# Patient Record
Sex: Female | Born: 1945
Health system: Southern US, Community
[De-identification: ages and names within clinical notes are randomized; demographics above are authoritative.]

## PROBLEM LIST (undated history)

## (undated) DIAGNOSIS — J45909 Unspecified asthma, uncomplicated: Secondary | ICD-10-CM

## (undated) DIAGNOSIS — I10 Essential (primary) hypertension: Secondary | ICD-10-CM

## (undated) DIAGNOSIS — E785 Hyperlipidemia, unspecified: Secondary | ICD-10-CM

## (undated) DIAGNOSIS — K219 Gastro-esophageal reflux disease without esophagitis: Secondary | ICD-10-CM

## (undated) DIAGNOSIS — M8580 Other specified disorders of bone density and structure, unspecified site: Secondary | ICD-10-CM

## (undated) DIAGNOSIS — M1990 Unspecified osteoarthritis, unspecified site: Secondary | ICD-10-CM

## (undated) DIAGNOSIS — Z8679 Personal history of other diseases of the circulatory system: Secondary | ICD-10-CM

## (undated) HISTORY — DX: Unspecified asthma, uncomplicated: J45.909

## (undated) HISTORY — DX: Unspecified osteoarthritis, unspecified site: M19.90

## (undated) HISTORY — DX: Other specified disorders of bone density and structure, unspecified site: M85.80

## (undated) HISTORY — DX: Essential (primary) hypertension: I10

## (undated) HISTORY — DX: Hyperlipidemia, unspecified: E78.5

## (undated) HISTORY — PX: BACK SURGERY: SHX140

## (undated) HISTORY — PX: BREAST EXCISIONAL BIOPSY: SUR124

## (undated) HISTORY — DX: Gastro-esophageal reflux disease without esophagitis: K21.9

## (undated) HISTORY — PX: CERVICAL POLYPECTOMY: SHX88

## (undated) HISTORY — PX: BREAST LUMPECTOMY: SHX2

## (undated) HISTORY — DX: Personal history of other diseases of the circulatory system: Z86.79

---

## 1997-08-23 ENCOUNTER — Ambulatory Visit (HOSPITAL_COMMUNITY): Admission: RE | Admit: 1997-08-23 | Discharge: 1997-08-23 | Payer: Self-pay | Admitting: *Deleted

## 1998-01-25 ENCOUNTER — Other Ambulatory Visit: Admission: RE | Admit: 1998-01-25 | Discharge: 1998-01-25 | Payer: Self-pay | Admitting: Internal Medicine

## 1998-05-22 ENCOUNTER — Other Ambulatory Visit: Admission: RE | Admit: 1998-05-22 | Discharge: 1998-05-22 | Payer: Self-pay | Admitting: *Deleted

## 1998-05-31 ENCOUNTER — Other Ambulatory Visit: Admission: RE | Admit: 1998-05-31 | Discharge: 1998-05-31 | Payer: Self-pay | Admitting: *Deleted

## 1998-09-14 ENCOUNTER — Ambulatory Visit (HOSPITAL_COMMUNITY): Admission: RE | Admit: 1998-09-14 | Discharge: 1998-09-14 | Payer: Self-pay | Admitting: *Deleted

## 1998-09-18 ENCOUNTER — Ambulatory Visit (HOSPITAL_COMMUNITY): Admission: RE | Admit: 1998-09-18 | Discharge: 1998-09-18 | Payer: Self-pay | Admitting: *Deleted

## 1999-06-14 ENCOUNTER — Other Ambulatory Visit: Admission: RE | Admit: 1999-06-14 | Discharge: 1999-06-14 | Payer: Self-pay | Admitting: Obstetrics & Gynecology

## 1999-09-25 ENCOUNTER — Encounter: Payer: Self-pay | Admitting: Obstetrics & Gynecology

## 1999-09-25 ENCOUNTER — Ambulatory Visit (HOSPITAL_COMMUNITY): Admission: RE | Admit: 1999-09-25 | Discharge: 1999-09-25 | Payer: Self-pay | Admitting: Obstetrics & Gynecology

## 2000-06-18 ENCOUNTER — Other Ambulatory Visit: Admission: RE | Admit: 2000-06-18 | Discharge: 2000-06-18 | Payer: Self-pay | Admitting: Obstetrics & Gynecology

## 2000-09-18 ENCOUNTER — Encounter: Payer: Self-pay | Admitting: Obstetrics & Gynecology

## 2000-09-18 ENCOUNTER — Encounter: Admission: RE | Admit: 2000-09-18 | Discharge: 2000-09-18 | Payer: Self-pay | Admitting: Obstetrics & Gynecology

## 2000-12-03 ENCOUNTER — Encounter: Payer: Self-pay | Admitting: Family Medicine

## 2000-12-03 ENCOUNTER — Encounter: Admission: RE | Admit: 2000-12-03 | Discharge: 2000-12-03 | Payer: Self-pay | Admitting: Family Medicine

## 2001-07-02 ENCOUNTER — Other Ambulatory Visit: Admission: RE | Admit: 2001-07-02 | Discharge: 2001-07-02 | Payer: Self-pay | Admitting: Obstetrics & Gynecology

## 2001-09-22 ENCOUNTER — Encounter: Payer: Self-pay | Admitting: Obstetrics & Gynecology

## 2001-09-22 ENCOUNTER — Ambulatory Visit (HOSPITAL_COMMUNITY): Admission: RE | Admit: 2001-09-22 | Discharge: 2001-09-22 | Payer: Self-pay | Admitting: Obstetrics & Gynecology

## 2002-07-02 ENCOUNTER — Other Ambulatory Visit: Admission: RE | Admit: 2002-07-02 | Discharge: 2002-07-02 | Payer: Self-pay | Admitting: Obstetrics & Gynecology

## 2002-07-12 ENCOUNTER — Encounter: Payer: Self-pay | Admitting: Family Medicine

## 2002-07-12 ENCOUNTER — Encounter: Admission: RE | Admit: 2002-07-12 | Discharge: 2002-07-12 | Payer: Self-pay | Admitting: Family Medicine

## 2002-09-24 ENCOUNTER — Encounter: Payer: Self-pay | Admitting: Obstetrics & Gynecology

## 2002-09-24 ENCOUNTER — Ambulatory Visit (HOSPITAL_COMMUNITY): Admission: RE | Admit: 2002-09-24 | Discharge: 2002-09-24 | Payer: Self-pay | Admitting: Obstetrics & Gynecology

## 2002-11-23 ENCOUNTER — Encounter: Admission: RE | Admit: 2002-11-23 | Discharge: 2002-12-07 | Payer: Self-pay | Admitting: Internal Medicine

## 2002-12-07 ENCOUNTER — Encounter: Payer: Self-pay | Admitting: Neurosurgery

## 2002-12-08 ENCOUNTER — Encounter: Payer: Self-pay | Admitting: Neurosurgery

## 2002-12-08 ENCOUNTER — Inpatient Hospital Stay (HOSPITAL_COMMUNITY): Admission: RE | Admit: 2002-12-08 | Discharge: 2002-12-10 | Payer: Self-pay | Admitting: Neurosurgery

## 2002-12-09 ENCOUNTER — Encounter: Payer: Self-pay | Admitting: Neurosurgery

## 2003-07-07 ENCOUNTER — Other Ambulatory Visit: Admission: RE | Admit: 2003-07-07 | Discharge: 2003-07-07 | Payer: Self-pay | Admitting: Obstetrics & Gynecology

## 2003-09-29 ENCOUNTER — Ambulatory Visit (HOSPITAL_COMMUNITY): Admission: RE | Admit: 2003-09-29 | Discharge: 2003-09-29 | Payer: Self-pay | Admitting: Obstetrics & Gynecology

## 2004-04-16 ENCOUNTER — Encounter: Admission: RE | Admit: 2004-04-16 | Discharge: 2004-04-16 | Payer: Self-pay | Admitting: Internal Medicine

## 2005-07-24 ENCOUNTER — Ambulatory Visit (HOSPITAL_COMMUNITY): Admission: RE | Admit: 2005-07-24 | Discharge: 2005-07-24 | Payer: Self-pay | Admitting: Obstetrics & Gynecology

## 2006-04-08 ENCOUNTER — Encounter: Admission: RE | Admit: 2006-04-08 | Discharge: 2006-05-07 | Payer: Self-pay | Admitting: Internal Medicine

## 2006-10-16 ENCOUNTER — Ambulatory Visit (HOSPITAL_COMMUNITY): Admission: RE | Admit: 2006-10-16 | Discharge: 2006-10-16 | Payer: Self-pay | Admitting: Obstetrics & Gynecology

## 2006-10-22 ENCOUNTER — Encounter: Admission: RE | Admit: 2006-10-22 | Discharge: 2006-10-22 | Payer: Self-pay | Admitting: Obstetrics & Gynecology

## 2007-05-13 ENCOUNTER — Encounter: Admission: RE | Admit: 2007-05-13 | Discharge: 2007-05-13 | Payer: Self-pay | Admitting: Obstetrics & Gynecology

## 2007-06-02 ENCOUNTER — Encounter: Admission: RE | Admit: 2007-06-02 | Discharge: 2007-06-02 | Payer: Self-pay | Admitting: Obstetrics & Gynecology

## 2007-10-19 ENCOUNTER — Encounter: Admission: RE | Admit: 2007-10-19 | Discharge: 2007-10-19 | Payer: Self-pay | Admitting: Obstetrics & Gynecology

## 2008-10-19 ENCOUNTER — Encounter: Admission: RE | Admit: 2008-10-19 | Discharge: 2008-10-19 | Payer: Self-pay | Admitting: Obstetrics & Gynecology

## 2009-10-23 ENCOUNTER — Emergency Department (HOSPITAL_BASED_OUTPATIENT_CLINIC_OR_DEPARTMENT_OTHER): Admission: EM | Admit: 2009-10-23 | Discharge: 2009-10-23 | Payer: Self-pay | Admitting: Emergency Medicine

## 2009-10-23 ENCOUNTER — Ambulatory Visit (HOSPITAL_COMMUNITY): Admission: RE | Admit: 2009-10-23 | Discharge: 2009-10-23 | Payer: Self-pay | Admitting: Obstetrics & Gynecology

## 2010-07-14 ENCOUNTER — Emergency Department (HOSPITAL_BASED_OUTPATIENT_CLINIC_OR_DEPARTMENT_OTHER)
Admission: EM | Admit: 2010-07-14 | Discharge: 2010-07-15 | Payer: Self-pay | Source: Home / Self Care | Admitting: Emergency Medicine

## 2010-08-04 ENCOUNTER — Encounter: Payer: Self-pay | Admitting: Internal Medicine

## 2010-08-05 ENCOUNTER — Encounter: Payer: Self-pay | Admitting: Neurosurgery

## 2010-09-19 ENCOUNTER — Other Ambulatory Visit (HOSPITAL_COMMUNITY): Payer: Self-pay | Admitting: Obstetrics & Gynecology

## 2010-09-19 DIAGNOSIS — Z1231 Encounter for screening mammogram for malignant neoplasm of breast: Secondary | ICD-10-CM

## 2010-10-25 ENCOUNTER — Ambulatory Visit (HOSPITAL_COMMUNITY)
Admission: RE | Admit: 2010-10-25 | Discharge: 2010-10-25 | Disposition: A | Discharge status: Home / Self Care | Payer: 59 | Source: Ambulatory Visit | Attending: Obstetrics & Gynecology | Admitting: Obstetrics & Gynecology

## 2010-10-25 DIAGNOSIS — Z1231 Encounter for screening mammogram for malignant neoplasm of breast: Secondary | ICD-10-CM | POA: Insufficient documentation

## 2010-11-30 NOTE — H&P (Signed)
NAME:  Denise Gonzales, Denise Gonzales NO.:  [id number]   MEDICAL RECORD NO.:  [id number]                   PATIENT TYPE:  INP   LOCATION:  2899                                 FACILITY:  [facility]   PHYSICIAN:  Hilda Lias, M.D.                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  Lately, she had  been complaining of back pain with radiation to the left leg.  She had  weakness which is getting worse despite conservative treatment which  includes an epidural injection.  Because of the persistent pain and  especially because of the weakness, MRI was obtained and she wants to  proceed with surgery.   PAST MEDICAL HISTORY:  1. Lumbar diskectomy.  2. Breast surgery.   ALLERGIES:  She is not allergic to any medication.   SOCIAL HISTORY:  Negative.   FAMILY HISTORY:  Father died at the age of 77 with high blood pressure and  kidney failure.  Mother died of a heart attack.   REVIEW OF SYSTEMS:  She has a history of high blood pressure, high  cholesterol, asthma, and back pain.   PHYSICAL EXAMINATION:  HEENT:  Normal.  NECK:  Normal.  LUNGS:  Clear.  CARDIAC:  Heart sounds normal.  No murmurs.  EXTREMITIES:  Normal pulses.  NEUROLOGICAL:  Mental status and cranial nerves normal.  Strength 5/5.  She  states she has a 3/5 weakness of dorsiflexion in the left foot.  Sensation:  She complains of numbness mostly in the top of the left foot.  Reflexes  symmetrical.   LABORATORY DATA:  The MRI showed she has a recurrent disk at L4-5.  At L3-4,  there is another herniated disk compromising the L4 nerve root.   CLINICAL IMPRESSION:  1. Recurrent L4-5 herniated disk.  2. L3-4 herniated disk.    RECOMMENDATIONS:  The patient is being admitted  for surgery.  We are going  to proceed with a L4-5 diskectomy and we are going to take a look at the  level of L3-4.  The decision of what to do at this level will be made during  surgery.  The patient knows of the risks such as infection, CSF leak,  worsening pain, paralysis, need for further surgery which might require  fusion.                                               Hilda Lias, M.D.    EB/MEDQ  D:  12/08/2002  T:  12/08/2002  Job:  [id number]

## 2010-11-30 NOTE — Op Note (Signed)
NAME:  Denise Gonzales, Denise Gonzales                             ACCOUNT NO.:  [id number]   MEDICAL RECORD NO.:  [id number]                   PATIENT TYPE:  INP   LOCATION:  3172                                 FACILITY:  [facility]   PHYSICIAN:  Hilda Lias, M.D.                DATE OF BIRTH:  1945/10/29   DATE OF PROCEDURE:  12/08/2002  DATE OF DISCHARGE:                                 OPERATIVE REPORT   PREOPERATIVE DIAGNOSIS:  Left L4-5 recurrent herniated disk, left L3-4  herniated disk.   POSTOPERATIVE DIAGNOSIS:  Left L4-5 recurrent herniated disk, left L3-4  herniated disk.   PROCEDURE:  Left L4 hemilaminectomy, left 4-5 total diskectomy, lysis of  adhesions, foraminotomy, and left 3-4 foraminotomy.  Microscope.   SURGEON:  Hilda Lias, M.D.   ASSISTANT:  Coletta Memos, M.D.   INDICATIONS FOR PROCEDURE:  The patient is a 65 year old female who had  surgery two years ago by a different Careers adviser.  Now, she has been complaining  of worsening of the pain with weakness of the left foot.  MRI showed  recurrent disk at the level of 4-5 with a calcified disk at the level of 3-  4. Surgery was advised.  The risks were explained in the history and  physical.   DESCRIPTION OF PROCEDURE:  The patient was taken to the OR and after  intubation, she was positioned in a prone manner. The back was prepped with  Betadine.  Midline incision resecting the previous scar was made.  By x-ray  we identified the area between 3-4 and 4-5.  The patient had quite a bit of  adhesions.  We started doing lysis of adhesions and with the drill we  started drilling the local lamina of L4.  Nevertheless, we found no yellow  ligament and we ended up having to do a L4 hemilaminectomy.  From then on we  worked our way down doing the lysis of adhesions.  Finally, we were able to  retract the thecal sac and enter into the disk space. The patient had quite  a bit of degenerative disk and total gross diskectomy was done.  There were  two large pieces of disk compromising the takeoff of L5 and attached to the  thecal sac. Those pieces were removed.  At the end, we had good  decompression of the L5 nerve root.  Then we investigated the area between 3-  4 where we found a calcified disk.  There was no need to remove the disk,  but we did foraminotomy to decompress the L3 and L4 nerve roots.  Having  done this, we used Pissell into the area to prevent any leak. Valsalva  maneuver was negative prior to that.  From then on, the area was irrigated  and the wound was closed with Vicryl and Steri-Strips.  Dressing was  applied.  Hilda Lias, M.D.    EB/MEDQ  D:  12/08/2002  T:  12/08/2002  Job:  [id number]

## 2011-10-16 ENCOUNTER — Other Ambulatory Visit (HOSPITAL_COMMUNITY): Payer: Self-pay | Admitting: Obstetrics & Gynecology

## 2011-10-16 DIAGNOSIS — Z1231 Encounter for screening mammogram for malignant neoplasm of breast: Secondary | ICD-10-CM

## 2011-11-11 ENCOUNTER — Ambulatory Visit (HOSPITAL_COMMUNITY)
Admission: RE | Admit: 2011-11-11 | Discharge: 2011-11-11 | Disposition: A | Discharge status: Home / Self Care | Payer: Medicare Other | Source: Ambulatory Visit | Attending: Obstetrics & Gynecology | Admitting: Obstetrics & Gynecology

## 2011-11-11 DIAGNOSIS — Z1231 Encounter for screening mammogram for malignant neoplasm of breast: Secondary | ICD-10-CM | POA: Insufficient documentation

## 2012-10-06 ENCOUNTER — Encounter: Payer: Self-pay | Admitting: Internal Medicine

## 2012-10-08 ENCOUNTER — Other Ambulatory Visit (HOSPITAL_COMMUNITY): Payer: Self-pay | Admitting: Obstetrics & Gynecology

## 2012-10-08 DIAGNOSIS — Z1231 Encounter for screening mammogram for malignant neoplasm of breast: Secondary | ICD-10-CM

## 2012-11-11 ENCOUNTER — Ambulatory Visit (HOSPITAL_COMMUNITY)
Admission: RE | Admit: 2012-11-11 | Discharge: 2012-11-11 | Disposition: A | Discharge status: Home / Self Care | Payer: Medicare Other | Source: Ambulatory Visit | Attending: Obstetrics & Gynecology | Admitting: Obstetrics & Gynecology

## 2012-11-11 DIAGNOSIS — Z1231 Encounter for screening mammogram for malignant neoplasm of breast: Secondary | ICD-10-CM

## 2013-09-28 ENCOUNTER — Encounter: Payer: Self-pay | Admitting: Internal Medicine

## 2013-09-28 ENCOUNTER — Encounter (INDEPENDENT_AMBULATORY_CARE_PROVIDER_SITE_OTHER): Payer: Self-pay

## 2013-09-28 ENCOUNTER — Ambulatory Visit (INDEPENDENT_AMBULATORY_CARE_PROVIDER_SITE_OTHER): Payer: Medicare Other | Admitting: Internal Medicine

## 2013-09-28 VITALS — BP 140/80 | HR 70 | Temp 97.3°F | Ht 64.0 in | Wt 124.4 lb

## 2013-09-28 DIAGNOSIS — J45909 Unspecified asthma, uncomplicated: Secondary | ICD-10-CM

## 2013-09-28 DIAGNOSIS — I10 Essential (primary) hypertension: Secondary | ICD-10-CM | POA: Insufficient documentation

## 2013-09-28 MED ORDER — FLUTICASONE-SALMETEROL 100-50 MCG/DOSE IN AEPB: INHALATION_SPRAY | RESPIRATORY_TRACT | Status: DC

## 2013-09-28 NOTE — Patient Instructions (Addendum)
Work on inhaler technique:  relax and gently blow all the way out then take a nice smooth deep breath back in, triggering the inhaler at same time you start breathing in.  Hold for up to 5 seconds if you can.  Rinse and gargle with water when done  If you want our help, we would be delighted to help but before you return here to this office we need to do the following  1) substitute by your formulary a better choice than lisinopril for a respiratory patient (diovan 160/25 one daily would be a good choice)  2) return with your formulary in hand  for a better plan A to chose from if you continue to need the proair more twice a week   3) you will need to be able to master the inhaler technique or we'll need to look at other options  4) call us with the name of your primary care doctor   5) Add back advair 100 just one puff each am (but increase to twice daily if still needing proaire more than twice a week)  6) consider our [place] office for follow up if desired

## 2013-09-28 NOTE — Assessment & Plan Note (Signed)
DDX of  difficult airways managment all start with A and  include Adherence, Ace Inhibitors, Acid Reflux, Active Sinus Disease, Alpha 1 Antitripsin deficiency, Anxiety masquerading as Airways dz,  ABPA,  allergy(esp in young), Aspiration (esp in elderly), Adverse effects of DPI,  Active smokers, plus two Bs  = Bronchiectasis and Beta blocker use..and one C= CHF  Adherence is always the initial "prime suspect" and is a multilayered concern that requires a "trust but verify" approach in every patient - starting with knowing how to use medications, especially inhalers, correctly, keeping up with refills and understanding the fundamental difference between maintenance and prns vs those medications only taken for a very short course and then stopped and not refilled.  - The proper method of use, as well as anticipated side effects, of a metered-dose inhaler are discussed and demonstrated to the patient. Improved effectiveness after extensive coaching during this visit to a level of approximately  25% from a baseline of < 5% so this calls into question whether what she thinks she treating is even asthma at all  ?ACEi > see hbp a/p  ? Allergies > doubt singulair doing much but at least not causing any side effects so would leave this on for now  Rec Restart advair 100 but just one daily with goal of no need for hfa saba Work on saba technique  Once off acei x 4 weeks try again to stop advair and see what effect if any this has on perceived need for saba

## 2013-09-28 NOTE — Assessment & Plan Note (Signed)
ACE inhibitors are problematic in  pts with airway complaints because  even experienced pulmonologists can't always distinguish ace effects from copd/asthma.  By themselves they don't actually cause a problem, much like oxygen can't by itself start a fire, but they certainly serve as a powerful catalyst or enhancer for any "fire"  or inflammatory process in the upper airway, be it caused by an ET  tube or more commonly reflux (especially in the obese or pts with known GERD or who are on biphoshonates).    In the era of ARB near equivalency until we have a better handle on the reversibility of the airway problem, it just makes sense to avoid ACEI  entirely in the short run until establish how many if any of her ongoing symptoms are really asthma related  See instructions for specific recommendations which were reviewed directly with the patient who was given a copy with highlighter outlining the key components.

## 2013-09-28 NOTE — Progress Notes (Signed)
   Subjective:    Patient ID: Denise Gonzales, female    DOB: Aug 24, 1945    MRN: [id number]  HPI  64 yowf never smoker with asthma since the 1980's and previously followed by Dr Linna Darner on advair/singulair and did fine and rare need for rescue until  due to insurance stopped advair around March 1st 2015 and since then needing proair at least twice daily referred by [facility] 09/28/2013 to pulmonary clinic   09/28/2013 1st [facility] office visit/ Saiquan Hands on ACEi with dtc asthma Chief Complaint  Patient presents with  . Pulmonary Consult    Self referral for asthma. Pt states she was previously seen by Dr Linna Darner. She states her breathing is stable at this time. She is using her rescue inhaler approx 2 x per day.   hfa extremely poor, still maintained on singulair Breathing and "congestion" mainly bothersome walking outside  No obvious other patterns in day to day or daytime variabilty or assoc chronic cough or cp or chest tightness, subjective wheeze overt sinus or hb symptoms. No unusual exp hx or h/o childhood pna/ asthma or knowledge of premature birth.  Sleeping ok without nocturnal  or early am exacerbation  of respiratory  c/o's or need for noct saba. Also denies any obvious fluctuation of symptoms with weather or environmental changes or other aggravating or alleviating factors except as outlined above   Current Medications, Allergies, Complete Past Medical History, Past Surgical History, Family History, and Social History were reviewed in [employer] record.             Review of Systems  Constitutional: Negative for fever, chills and unexpected weight change.  HENT: Negative for congestion, dental problem, ear pain, nosebleeds, postnasal drip, rhinorrhea, sinus pressure, sneezing, sore throat, trouble swallowing and voice change.   Eyes: Negative for visual disturbance.  Respiratory: Negative for cough, choking and shortness of breath.    Cardiovascular: Negative for chest pain and leg swelling.  Gastrointestinal: Negative for vomiting, abdominal pain and diarrhea.  Genitourinary: Negative for difficulty urinating.  Musculoskeletal: Negative for arthralgias.  Skin: Negative for rash.  Neurological: Negative for tremors, syncope and headaches.  Hematological: Does not bruise/bleed easily.       Objective:   Physical Exam  amb wf nad  Wt Readings from Last 3 Encounters:  09/28/13 124 lb 6.4 oz (56.427 kg)     HEENT: nl dentition, turbinates, and orophanx. Nl external ear canals without cough reflex   NECK :  without JVD/Nodes/TM/ nl carotid upstrokes bilaterally   LUNGS: no acc muscle use, clear to A and P bilaterally without cough on insp or exp maneuvers   CV:  RRR  no s3 or murmur or increase in P2, no edema   ABD:  soft and nontender with nl excursion in the supine position. No bruits or organomegaly, bowel sounds nl  MS:  warm without deformities, calf tenderness, cyanosis or clubbing  SKIN: warm and dry without lesions    NEURO:  alert, approp, no deficits        Assessment & Plan:

## 2013-10-13 ENCOUNTER — Other Ambulatory Visit (HOSPITAL_COMMUNITY): Payer: Self-pay | Admitting: Obstetrics & Gynecology

## 2013-10-13 DIAGNOSIS — Z1231 Encounter for screening mammogram for malignant neoplasm of breast: Secondary | ICD-10-CM

## 2013-10-25 ENCOUNTER — Encounter: Payer: Self-pay | Admitting: Internal Medicine

## 2013-11-12 ENCOUNTER — Encounter (INDEPENDENT_AMBULATORY_CARE_PROVIDER_SITE_OTHER): Payer: Self-pay

## 2013-11-12 ENCOUNTER — Ambulatory Visit (HOSPITAL_COMMUNITY)
Admission: RE | Admit: 2013-11-12 | Discharge: 2013-11-12 | Disposition: A | Discharge status: Home / Self Care | Payer: Medicare Other | Source: Ambulatory Visit | Attending: Obstetrics & Gynecology | Admitting: Obstetrics & Gynecology

## 2013-11-12 DIAGNOSIS — Z1231 Encounter for screening mammogram for malignant neoplasm of breast: Secondary | ICD-10-CM | POA: Insufficient documentation

## 2013-12-23 ENCOUNTER — Encounter: Payer: Self-pay | Admitting: Pulmonary Disease

## 2013-12-23 ENCOUNTER — Ambulatory Visit (INDEPENDENT_AMBULATORY_CARE_PROVIDER_SITE_OTHER): Payer: Medicare Other | Admitting: Pulmonary Disease

## 2013-12-23 VITALS — BP 134/82 | HR 85 | Ht 63.0 in | Wt 120.0 lb

## 2013-12-23 DIAGNOSIS — J45909 Unspecified asthma, uncomplicated: Secondary | ICD-10-CM

## 2013-12-23 MED ORDER — FLUTICASONE FUROATE-VILANTEROL 100-25 MCG/INH IN AEPB: 1.0000 | INHALATION_SPRAY | Freq: Every day | RESPIRATORY_TRACT | Status: DC

## 2013-12-23 NOTE — Progress Notes (Signed)
   Subjective:    Patient ID: Denise Gonzales, female    DOB: 07-Aug-1945, 68 y.o.   MRN: [id number]  HPI  7 yowf never smoker with asthma since the 1980's and previously followed by Dr Linna Darner on advair/singulair and did fine and rare need for rescue until due to insurance stopped advair around March 1st 2015 and since then needing proair at least twice daily referred by [facility] 09/28/2013 to pulmonary clinic -seen by MW  hfa extremely poor, still maintained on singulair  Breathing and "congestion" mainly bothersome walking outside  12/23/2013  Chief Complaint  Patient presents with  . Advice Only    Pt transferring from Dr. Melvyn Novas to Dr. Elsworth Soho for chronic asthma. Was dx'ed by Dr Linna Darner in late 1970's, is looking for continuation of care since his retirement.   2 dogs Ran out of advair samples a week ago Insurance would not cover advair or symbicort Spirometry showed moderate airway obstruction   Past Medical History  Diagnosis Date  . Asthma   . Hypertension   . Hyperlipidemia       Review of Systems  Constitutional: Negative for fever and unexpected weight change.  HENT: Positive for sinus pressure. Negative for congestion, dental problem, ear pain, nosebleeds, postnasal drip, rhinorrhea, sneezing, sore throat and trouble swallowing.   Eyes: Negative for redness and itching.  Respiratory: Positive for chest tightness, shortness of breath and wheezing. Negative for cough.   Cardiovascular: Negative for palpitations and leg swelling.  Gastrointestinal: Negative for nausea and vomiting.  Genitourinary: Negative for dysuria.  Musculoskeletal: Negative for joint swelling.  Skin: Negative for rash.  Neurological: Negative for headaches.  Hematological: Does not bruise/bleed easily.  Psychiatric/Behavioral: Negative for dysphoric mood. The patient is not nervous/anxious.        Objective:   Physical Exam  Gen. Pleasant, well-nourished, in no distress ENT - no lesions, no  post nasal drip Neck: No JVD, no thyromegaly, no carotid bruits Lungs: no use of accessory muscles, no dullness to percussion, clear without rales or rhonchi  Cardiovascular: Rhythm regular, heart sounds  normal, no murmurs or gallops, no peripheral edema Musculoskeletal: No deformities, no cyanosis or clubbing        Assessment & Plan:

## 2013-12-23 NOTE — Patient Instructions (Signed)
ALternatives to ADVAIR are Babs [place] or [place] with your insurance & call us back so we can send in RX Breathing test shows moderate airway obstruction- 60%

## 2013-12-24 ENCOUNTER — Telehealth: Payer: Self-pay | Admitting: Pulmonary Disease

## 2013-12-24 MED ORDER — FLUTICASONE-SALMETEROL 100-50 MCG/DOSE IN AEPB: INHALATION_SPRAY | RESPIRATORY_TRACT | Status: DC

## 2013-12-24 NOTE — Assessment & Plan Note (Signed)
Her asthma seems to be a persistent nature. She does require an inhaled steroid- she will find out from her insurance which off the steroid laba combinations is within the formulary I gave her a card for breo in the meantime She will call if symptoms worsen Stay on Singulair We spent sometime discussing the difference between maintenance & rescue medication

## 2013-12-24 NOTE — Telephone Encounter (Signed)
Pt is aware RX has been sent. Nothing further needed 

## 2014-02-17 ENCOUNTER — Encounter: Payer: Self-pay | Admitting: Pulmonary Disease

## 2014-02-17 ENCOUNTER — Ambulatory Visit (INDEPENDENT_AMBULATORY_CARE_PROVIDER_SITE_OTHER): Payer: Medicare Other | Admitting: Pulmonary Disease

## 2014-02-17 VITALS — BP 142/68 | HR 66 | Temp 98.1°F | Ht 63.0 in | Wt 121.1 lb

## 2014-02-17 DIAGNOSIS — J4540 Moderate persistent asthma, uncomplicated: Secondary | ICD-10-CM

## 2014-02-17 DIAGNOSIS — J45909 Unspecified asthma, uncomplicated: Secondary | ICD-10-CM

## 2014-02-17 MED ORDER — MOMETASONE FURO-FORMOTEROL FUM 200-5 MCG/ACT IN AERO: 2.0000 | INHALATION_SPRAY | Freq: Two times a day (BID) | RESPIRATORY_TRACT | Status: DC

## 2014-02-17 NOTE — Patient Instructions (Signed)
Rx for Dulera 200 twice daily x 3 months x  3 refills - sample today Call us with name of nasal spray for Rx Ask cardiologist if lisinopril can be changed to benicar -HCTZ or losartan HCTZ to decrease cough & upper airway irritation

## 2014-02-17 NOTE — Progress Notes (Signed)
   Subjective:    Patient ID: MISTY FOUTZ, female    DOB: [birth year], 68 y.o.   MRN: [id number]  HPI  68 yowf never smoker with asthma since the 1980's and previously followed by Dr Linna Darner on advair/singulair and did fine and rare need for rescue until due to insurance stopped advair around March 1st 2015 and since then needing proair at least twice daily referred by [facility] 09/28/2013 to pulmonary clinic -seen by MW  She transferred to my care in 12/2013  Spirometry showed moderate airway obstruction   02/17/2014  Chief Complaint  Patient presents with  . Follow-up    Pt reports she has good and bad days with breathing. She is now taking advair BID. C/o PND, left ear feels congested x this week. Denies any wheezing, no chest tx, no cough  Insurance would not cover advair or symbicort  Taking advair twice daily for last 2 ds Breathing at baseline She has ben on lisinopril x 18 yrs & resistant to change 2 dogs  Spirometry again showed moderate airway obstruction with FEV1 of 1.39-64%  Review of Systems neg for any significant sore throat, dysphagia, itching, sneezing, nasal congestion or excess/ purulent secretions, fever, chills, sweats, unintended wt loss, pleuritic or exertional cp, hempoptysis, orthopnea pnd or change in chronic leg swelling. Also denies presyncope, palpitations, heartburn, abdominal pain, nausea, vomiting, diarrhea or change in bowel or urinary habits, dysuria,hematuria, rash, arthralgias, visual complaints, headache, numbness weakness or ataxia.     Objective:   Physical Exam  Gen. Pleasant, well-nourished, in no distress ENT - no lesions, no post nasal drip Neck: No JVD, no thyromegaly, no carotid bruits Lungs: no use of accessory muscles, no dullness to percussion, clear without rales or rhonchi  Cardiovascular: Rhythm regular, heart sounds  normal, no murmurs or gallops, no peripheral edema Musculoskeletal: No deformities, no cyanosis or clubbing         Assessment & Plan:

## 2014-02-18 NOTE — Assessment & Plan Note (Signed)
She continues to have moderate airway obstruction on spirometry, this x-ray wonder if there is a degree of fixed airway obstruction that cannot be reversed.  Regardless she does seem to need a steroid/ LABA combination for maintenance Rx for Dulera 200 twice daily x 3 months x  3 refills - sample today Call us with name of nasal spray for Rx Ask cardiologist if lisinopril can be changed to benicar -HCTZ or losartan HCTZ to decrease cough & upper airway irritation

## 2014-05-26 ENCOUNTER — Ambulatory Visit: Payer: Medicare Other | Admitting: Pulmonary Disease

## 2014-11-02 ENCOUNTER — Other Ambulatory Visit (HOSPITAL_COMMUNITY): Payer: Self-pay | Admitting: Obstetrics & Gynecology

## 2014-11-02 DIAGNOSIS — Z1231 Encounter for screening mammogram for malignant neoplasm of breast: Secondary | ICD-10-CM

## 2014-11-16 ENCOUNTER — Ambulatory Visit (HOSPITAL_COMMUNITY)
Admission: RE | Admit: 2014-11-16 | Discharge: 2014-11-16 | Disposition: A | Discharge status: Home / Self Care | Payer: Medicare Other | Source: Ambulatory Visit | Attending: Obstetrics & Gynecology | Admitting: Obstetrics & Gynecology

## 2014-11-16 DIAGNOSIS — Z1231 Encounter for screening mammogram for malignant neoplasm of breast: Secondary | ICD-10-CM | POA: Diagnosis present

## 2015-02-02 ENCOUNTER — Encounter: Payer: Self-pay | Admitting: Pulmonary Disease

## 2015-02-02 ENCOUNTER — Ambulatory Visit (INDEPENDENT_AMBULATORY_CARE_PROVIDER_SITE_OTHER): Payer: Medicare Other | Admitting: Pulmonary Disease

## 2015-02-02 VITALS — BP 158/67 | HR 75 | Ht 63.5 in | Wt 121.0 lb

## 2015-02-02 DIAGNOSIS — J4540 Moderate persistent asthma, uncomplicated: Secondary | ICD-10-CM | POA: Diagnosis not present

## 2015-02-02 DIAGNOSIS — R0602 Shortness of breath: Secondary | ICD-10-CM | POA: Diagnosis not present

## 2015-02-02 NOTE — Assessment & Plan Note (Signed)
Refills on dulera Call if worse or excessive albuterol use  Feel chronic airway obstruction - & established new baseline

## 2015-02-02 NOTE — Patient Instructions (Signed)
Refills on dulera Call if worse or excessive albuterol use

## 2015-02-02 NOTE — Progress Notes (Signed)
   Subjective:    Patient ID: Denise Gonzales, female    DOB: 01/31/1946, 69 y.o.   MRN: [id number]  HPI  16 yowf never smoker with asthma since the 1980's and previously followed by Dr Linna Darner on advair/singulair and did fine and rare need for rescue until due to insurance stopped advair around March 1st 2015 and since then needing proair at least twice daily referred by [facility] 09/28/2013 to pulmonary clinic -seen by MW  She transferred to my care in 12/2013   02/02/2015  Chief Complaint  Patient presents with  . Follow-up    patient still using Dulera but doesn't think it works any better than Advair 100/50.  no concerns.    Insurance would not cover advair or symbicort -hence changed to dulera -does not feel any better than advair Breathing at baseline She has ben on lisinopril x 18 yrs & resistant to change 2 dogs  No excessive saba use, no noct symps, no hosp admits or ER visits  12/2013 Spirometry showed moderate airway obstruction 02/2014 Spirometry again showed moderate airway obstruction with FEV1 of 1.39-64% 01/2015 FEV1 63%  Review of Systems neg for any significant sore throat, dysphagia, itching, sneezing, nasal congestion or excess/ purulent secretions, fever, chills, sweats, unintended wt loss, pleuritic or exertional cp, hempoptysis, orthopnea pnd or change in chronic leg swelling. Also denies presyncope, palpitations, heartburn, abdominal pain, nausea, vomiting, diarrhea or change in bowel or urinary habits, dysuria,hematuria, rash, arthralgias, visual complaints, headache, numbness weakness or ataxia.     Objective:   Physical Exam  Gen. Pleasant, well-nourished, in no distress ENT - no lesions, no post nasal drip Neck: No JVD, no thyromegaly, no carotid bruits Lungs: no use of accessory muscles, no dullness to percussion, clear without rales or rhonchi  Cardiovascular: Rhythm regular, heart sounds  normal, no murmurs or gallops, no peripheral  edema Musculoskeletal: No deformities, no cyanosis or clubbing        Assessment & Plan:

## 2015-04-24 ENCOUNTER — Ambulatory Visit (INDEPENDENT_AMBULATORY_CARE_PROVIDER_SITE_OTHER): Payer: Medicare Other | Admitting: Gastroenterology

## 2015-04-24 ENCOUNTER — Encounter: Payer: Self-pay | Admitting: Gastroenterology

## 2015-04-24 VITALS — BP 154/70 | HR 78 | Ht 63.5 in | Wt 120.1 lb

## 2015-04-24 DIAGNOSIS — Z1211 Encounter for screening for malignant neoplasm of colon: Secondary | ICD-10-CM

## 2015-04-24 DIAGNOSIS — K219 Gastro-esophageal reflux disease without esophagitis: Secondary | ICD-10-CM

## 2015-04-24 MED ORDER — PANTOPRAZOLE SODIUM 40 MG PO TBEC: 40.0000 mg | DELAYED_RELEASE_TABLET | Freq: Every day | ORAL | Status: DC

## 2015-04-24 NOTE — Patient Instructions (Signed)
You have been scheduled for an endoscopy. Please follow written instructions given to you at your visit today. If you use inhalers (even only as needed), please bring them with you on the day of your procedure.  We have sent the following medications to your pharmacy for you to pick up at your convenience: Pantoprazole

## 2015-04-24 NOTE — Progress Notes (Signed)
HPI :  69 y/o female seen her in consultation for GERD. By "reflux" she endorses her symptoms include intermittent discomfort in the lower chest and epigastric area, she feels like a burning sensation or pressure. She denies any dysphagia . She is otherwise eating and drinking okay. She thinks reflux symptoms have bothered her for several years. She has been on nexium in the past which worked okay, and then she stopped it and symptoms recurred. She was placed on omeprazole and zantac for a period of time and does not think it helped  as much as nexium, she had more frequent breakthrough. She was given a free sample of Dexilant 60mg  for 5 days and it worked for her until she ran out. Now with recurrence of symptoms as she is not taking anything for reflux. No nausea or vomiting, but can have strong gag reflex due to reflux. No weight loss. No tobacco use. She reports having had an upper endoscopy previously, unclear when, by a Dr. Ferdinand Lango in [facility] in [facility]. PCM reports this was done in 06/2006. She has no reported history of Barrett's esophagus. She carries a reported history of osteoporosis / osteopenia.   Last colonoscopy was in 2004, she thinks she had some diverticulosis but otherwise normal. She denies any problems with her bowel habits. No blood in the stools.  Niece had colon cancer age 21.      Past Medical History  Diagnosis Date  . Asthma   . Hypertension   . Hyperlipidemia   . GERD (gastroesophageal reflux disease)   . DJD (degenerative joint disease)   . History of PSVT (paroxysmal supraventricular tachycardia)   . Osteopenia      Past Surgical History  Procedure Laterality Date  . Back surgery  1990 and 2004  . Breast lumpectomy    . Cervical polypectomy     Family History  Problem Relation Age of Onset  . Asthma Sister   . Allergies Sister   . Breast cancer Sister   . Colon cancer      neice at 62 years old  . Hypertension      multiple family members    . Kidney failure Father    Social History  Substance Use Topics  . Smoking status: Never Smoker   . Smokeless tobacco: Never Used  . Alcohol Use: No   Current Outpatient Prescriptions  Medication Sig Dispense Refill  . atorvastatin (LIPITOR) 20 MG tablet Take 1 tablet by mouth daily.    Marland Kitchen b complex vitamins tablet Take 1 tablet by mouth daily.    . butalbital-acetaminophen-caffeine (FIORICET, ESGIC) 50-325-40 MG per tablet Take 1 tablet by mouth 2 (two) times daily as needed for headache.    . diltiazem (CARDIZEM) 120 MG tablet Take 120 mg by mouth daily.    Marland Kitchen lisinopril-hydrochlorothiazide (PRINZIDE,ZESTORETIC) 20-12.5 MG per tablet Take 1 tablet by mouth daily.    Marland Kitchen loratadine (CLARITIN) 10 MG tablet Take 10 mg by mouth daily.    . mometasone-formoterol (DULERA) 200-5 MCG/ACT AERO Inhale 2 puffs into the lungs 2 (two) times daily. 3 Inhaler 3  . montelukast (SINGULAIR) 10 MG tablet Take 1 tablet by mouth daily.    . Multiple Vitamins-Minerals (CENTRUM SILVER PO) Take 1 tablet by mouth daily.    . Multiple Vitamins-Minerals (ICAPS) CAPS Take 1 capsule by mouth daily.    . Omega-3 Fatty Acids (FISH OIL PO) Take 1 tablet by mouth daily.    Marland Kitchen OVER THE COUNTER MEDICATION  Osteospam (supplement for bone health) 2 capsules BID    . PROAIR HFA 108 (90 BASE) MCG/ACT inhaler Inhale 2 puffs into the lungs every 4 (four) hours as needed.    . pantoprazole (PROTONIX) 40 MG tablet Take 1 tablet (40 mg total) by mouth daily. 120 tablet 1   No current facility-administered medications for this visit.   No Known Allergies   Review of Systems: All systems reviewed and negative except where noted in HPI.   No labs in Epic  Faxed labs reviewed from 02/13/2015 Normal renal panel and LFTs Normal Hgb 12.3, normal WBC    Physical Exam: BP 154/70 mmHg  Pulse 78  Ht 5' 3.5" (1.613 m)  Wt 120 lb 2 oz (54.488 kg)  BMI 20.94 kg/m2 Constitutional: Pleasant,well-developed, female in no acute  distress. HEENT: Normocephalic and atraumatic. Conjunctivae are normal. No scleral icterus. Neck supple.  Cardiovascular: Normal rate, regular rhythm.  Pulmonary/chest: Effort normal and breath sounds normal. No wheezing, rales or rhonchi. Abdominal: Soft, nondistended, nontender. Bowel sounds active throughout. There are no masses palpable. No hepatomegaly. Extremities: no edema Lymphadenopathy: No cervical adenopathy noted. Neurological: Alert and oriented to person place and time. Skin: Skin is warm and dry. No rashes noted. Psychiatric: Normal mood and affect. Behavior is normal.   ASSESSMENT AND PLAN: 69 y/o female with longstanding reflux symptoms. She has responded well to some PPIs in the past, but omeprazole recently did not provide as much benefit as other regimens she has had in the past. She reportedly has had a prior EGD without evidence of Barrett's, but do not have this on file and will submit a request to have it added to her records. She has no alarm symptoms at present time, no anemia, no risk factors for esophageal cancer. Recommend the lowest dose of medication needed to control symptoms. It appears she does need daily PPI in this light. She did not do too well with omeprazole, will try pantoprazole at 40mg  daily to see if this can control symptoms (she asked for Dexilant but suspect this is much more expensive). The risks of long term PPIs were discussed with her, with current data include increased risk for chronic kidney disease, increased risk of fracture, increased risk of C diff, increased risk of pneumonia (short term usage), potentially increased risk of dementia, potentially increased risk of B12 / calcium deficiency, and rare risk of hypomagnesemia. Patient was counseled to use the lowest daily use of PPI needed to control symptoms in light of her history of osteopenia / porosis. Renal function is currently normal and would recommend checking this yearly while on PPI  therapy. I don't think she needs an EGD at this time if symptoms are controlled with PPI, however will obtain records of her prior exam to ensure normal. If she can lower dose to 20mg  over time to control symptoms that would be best, although will start at 40mg  given inadequate response to 40mg  omeprazole.   Patient is otherwise due for screening colonoscopy. She has no symptoms in this light, niece was just diagnosed with colon cancer at age 73s. The indications, risks, and benefits of colonoscopy were explained to the patient in detail. Risks include but are not limited to bleeding, perforation, adverse reaction to medications, and cardiopulmonary compromise. Sequelae include but are not limited to the possibility of surgery, hospitalization, and mortality. The patient verbalized understanding and wished to proceed. All questions answered, referred to the scheduler and bowel prep ordered. Further recommendations pending results of  the exam.    Cellar, MD [facility] Pager [phone]

## 2015-04-26 ENCOUNTER — Other Ambulatory Visit: Payer: Self-pay

## 2015-04-26 DIAGNOSIS — Z1211 Encounter for screening for malignant neoplasm of colon: Secondary | ICD-10-CM

## 2015-04-26 DIAGNOSIS — K219 Gastro-esophageal reflux disease without esophagitis: Secondary | ICD-10-CM

## 2015-04-26 MED ORDER — NA SULFATE-K SULFATE-MG SULF 17.5-3.13-1.6 GM/177ML PO SOLN: ORAL | Status: DC

## 2015-04-28 ENCOUNTER — Telehealth: Payer: Self-pay | Admitting: Gastroenterology

## 2015-04-28 NOTE — Telephone Encounter (Signed)
This note is to update the patient's record. Old EGD obtained from 06/27/2006 - there was esophagitis noted in the distal esophagus, biopsies showed no evidence of Barrett's. Some gastritis was noted but biopsies negative for H pylori.

## 2015-05-08 ENCOUNTER — Other Ambulatory Visit: Payer: Self-pay | Admitting: Pulmonary Disease

## 2015-06-22 ENCOUNTER — Encounter: Payer: Medicare Other | Admitting: Gastroenterology

## 2015-11-21 ENCOUNTER — Other Ambulatory Visit (HOSPITAL_BASED_OUTPATIENT_CLINIC_OR_DEPARTMENT_OTHER): Payer: Self-pay | Admitting: Obstetrics & Gynecology

## 2015-11-21 DIAGNOSIS — Z1231 Encounter for screening mammogram for malignant neoplasm of breast: Secondary | ICD-10-CM

## 2015-11-27 ENCOUNTER — Ambulatory Visit (HOSPITAL_BASED_OUTPATIENT_CLINIC_OR_DEPARTMENT_OTHER)
Admission: RE | Admit: 2015-11-27 | Discharge: 2015-11-27 | Disposition: A | Discharge status: Home / Self Care | Payer: Medicare Other | Source: Ambulatory Visit | Attending: Obstetrics & Gynecology | Admitting: Obstetrics & Gynecology

## 2015-11-27 DIAGNOSIS — Z1231 Encounter for screening mammogram for malignant neoplasm of breast: Secondary | ICD-10-CM | POA: Insufficient documentation

## 2016-04-29 ENCOUNTER — Other Ambulatory Visit: Payer: Self-pay | Admitting: Pulmonary Disease

## 2016-12-02 ENCOUNTER — Other Ambulatory Visit: Payer: Self-pay | Admitting: Internal Medicine

## 2016-12-02 ENCOUNTER — Other Ambulatory Visit: Payer: Self-pay | Admitting: Obstetrics & Gynecology

## 2016-12-02 DIAGNOSIS — Z1231 Encounter for screening mammogram for malignant neoplasm of breast: Secondary | ICD-10-CM

## 2016-12-05 ENCOUNTER — Ambulatory Visit (HOSPITAL_BASED_OUTPATIENT_CLINIC_OR_DEPARTMENT_OTHER)
Admission: RE | Admit: 2016-12-05 | Discharge: 2016-12-05 | Disposition: A | Discharge status: Home / Self Care | Payer: Medicare Other | Source: Ambulatory Visit | Attending: Obstetrics & Gynecology | Admitting: Obstetrics & Gynecology

## 2016-12-05 ENCOUNTER — Encounter (HOSPITAL_BASED_OUTPATIENT_CLINIC_OR_DEPARTMENT_OTHER): Payer: Self-pay

## 2016-12-05 DIAGNOSIS — Z1231 Encounter for screening mammogram for malignant neoplasm of breast: Secondary | ICD-10-CM | POA: Insufficient documentation

## 2016-12-12 ENCOUNTER — Encounter: Payer: Self-pay | Admitting: Pulmonary Disease

## 2016-12-12 ENCOUNTER — Ambulatory Visit (INDEPENDENT_AMBULATORY_CARE_PROVIDER_SITE_OTHER): Payer: Medicare Other | Admitting: Pulmonary Disease

## 2016-12-12 VITALS — BP 165/73 | HR 68 | Ht 63.5 in | Wt 119.0 lb

## 2016-12-12 DIAGNOSIS — J45909 Unspecified asthma, uncomplicated: Secondary | ICD-10-CM | POA: Diagnosis not present

## 2016-12-12 DIAGNOSIS — I10 Essential (primary) hypertension: Secondary | ICD-10-CM | POA: Diagnosis not present

## 2016-12-12 MED ORDER — MOMETASONE FURO-FORMOTEROL FUM 100-5 MCG/ACT IN AERO: 2.0000 | INHALATION_SPRAY | Freq: Two times a day (BID) | RESPIRATORY_TRACT | 2 refills | Status: DC

## 2016-12-12 NOTE — Assessment & Plan Note (Signed)
Decrease dose of Dulera to 100 twice daily  Use albuterol on an as-needed basis only

## 2016-12-12 NOTE — Assessment & Plan Note (Signed)
She will get back on antihypertensive and recheck

## 2016-12-12 NOTE — Progress Notes (Signed)
   Subjective:    Patient ID: Denise Gonzales, female    DOB: 1945-11-21, 71 y.o.   MRN: [id number]  HPI   43 yowf never smoker with asthma since the 1980's   She was previously followed by Dr Linna Darner on advair/singulair  Chief Complaint  Patient presents with  . Follow-up    Asthma. Breathing has been ok since last visit.    In 2016, she was changed to [facility] 200 and this has worked well for her. No ED visits or hospitalizations. She uses albuterol on occasion She has ben on lisinopril x 20 yrs  2 dogs   Blood pressure is high today-she has been out of her medication for 1 day Spirometry shows moderate airway obstruction again   Significant tests/ events reviewed  02/2014 Spirometry again showed moderate airway obstruction with FEV1 of 1.39-64% 01/2015 FEV1 63%  11/2016 FEV1 64%, ratio 70    Review of Systems Patient denies significant dyspnea,cough, hemoptysis,  chest pain, palpitations, pedal edema, orthopnea, paroxysmal nocturnal dyspnea, lightheadedness, nausea, vomiting, abdominal or  leg pains      Objective:   Physical Exam   Gen. Pleasant, well-nourished, in no distress ENT - no thrush, no post nasal drip Neck: No JVD, no thyromegaly, no carotid bruits Lungs: no use of accessory muscles, no dullness to percussion, clear without rales or rhonchi  Cardiovascular: Rhythm regular, heart sounds  normal, no murmurs or gallops, no peripheral edema Musculoskeletal: No deformities, no cyanosis or clubbing         Assessment & Plan:

## 2016-12-12 NOTE — Addendum Note (Signed)
Addended by: Valerie Salts on: 12/12/2016 04:23 PM   Modules accepted: Orders

## 2016-12-12 NOTE — Patient Instructions (Signed)
Decrease dose of Dulera to 100 twice daily Call us as needed

## 2017-04-23 ENCOUNTER — Encounter (HOSPITAL_BASED_OUTPATIENT_CLINIC_OR_DEPARTMENT_OTHER): Payer: Self-pay | Admitting: Emergency Medicine

## 2017-04-23 ENCOUNTER — Emergency Department (HOSPITAL_BASED_OUTPATIENT_CLINIC_OR_DEPARTMENT_OTHER)
Admission: EM | Admit: 2017-04-23 | Discharge: 2017-04-23 | Disposition: A | Discharge status: Home / Self Care | Payer: Medicare Other | Attending: Emergency Medicine | Admitting: Emergency Medicine

## 2017-04-23 ENCOUNTER — Emergency Department (HOSPITAL_BASED_OUTPATIENT_CLINIC_OR_DEPARTMENT_OTHER): Payer: Medicare Other

## 2017-04-23 DIAGNOSIS — Z79899 Other long term (current) drug therapy: Secondary | ICD-10-CM | POA: Diagnosis not present

## 2017-04-23 DIAGNOSIS — R079 Chest pain, unspecified: Secondary | ICD-10-CM | POA: Diagnosis present

## 2017-04-23 DIAGNOSIS — I10 Essential (primary) hypertension: Secondary | ICD-10-CM | POA: Insufficient documentation

## 2017-04-23 DIAGNOSIS — R0789 Other chest pain: Secondary | ICD-10-CM | POA: Diagnosis not present

## 2017-04-23 DIAGNOSIS — Z7982 Long term (current) use of aspirin: Secondary | ICD-10-CM | POA: Insufficient documentation

## 2017-04-23 DIAGNOSIS — J45909 Unspecified asthma, uncomplicated: Secondary | ICD-10-CM | POA: Insufficient documentation

## 2017-04-23 LAB — BASIC METABOLIC PANEL
ANION GAP: 6 (ref 5–15)
BUN: 15 mg/dL (ref 6–20)
CALCIUM: 9.2 mg/dL (ref 8.9–10.3)
CHLORIDE: 105 mmol/L (ref 101–111)
CO2: 27 mmol/L (ref 22–32)
Creatinine, Ser: 0.98 mg/dL (ref 0.44–1.00)
GFR, EST NON AFRICAN AMERICAN: 57 mL/min — AB (ref >60)
Glucose, Bld: 101 mg/dL — ABNORMAL HIGH (ref 65–99)
Potassium: 3.9 mmol/L (ref 3.5–5.1)
SODIUM: 138 mmol/L (ref 135–145)

## 2017-04-23 LAB — CBC
HEMATOCRIT: 36.9 % (ref 36.0–46.0)
HEMOGLOBIN: 12.5 g/dL (ref 12.0–15.0)
MCH: 29.2 pg (ref 26.0–34.0)
MCHC: 33.9 g/dL (ref 30.0–36.0)
MCV: 86.2 fL (ref 78.0–100.0)
Platelets: 286 10*3/uL (ref 150–400)
RBC: 4.28 MIL/uL (ref 3.87–5.11)
RDW: 13.8 % (ref 11.5–15.5)
WBC: 6.7 10*3/uL (ref 4.0–10.5)

## 2017-04-23 LAB — TROPONIN I

## 2017-04-23 MED ORDER — NITROGLYCERIN 2 % TD OINT
0.5000 [in_us] | TOPICAL_OINTMENT | Freq: Once | TRANSDERMAL | Status: AC
  Administered 2017-04-23: 0.5 [in_us] via TOPICAL
  Filled 2017-04-23: qty 1

## 2017-04-23 MED ORDER — NITROGLYCERIN 0.4 MG SL SUBL
0.4000 mg | SUBLINGUAL_TABLET | SUBLINGUAL | Status: DC | PRN
Start: 2017-04-23 — End: 2017-04-24
  Filled 2017-04-23 (×2): qty 1

## 2017-04-23 NOTE — ED Triage Notes (Signed)
Left sided chest pressure since this am.  Some heart racing yesterday and today.  No nausea.  No SOB.  No Diaphoresis.

## 2017-04-23 NOTE — ED Notes (Signed)
Updated pt to plan of care, offered water and she is comfortable at this time.

## 2017-04-23 NOTE — ED Notes (Signed)
Pt verbalizes understanding of d/c instructions and denies any further needs at this time. 

## 2017-04-23 NOTE — ED Provider Notes (Signed)
[facility] Provider Note   CSN: [id number] Arrival date & time: 04/23/17  1820     History   Chief Complaint Chief Complaint  Patient presents with  . Chest Pain    HPI Denise ELEY is a 71 y.o. female.  Pt presents to the ED today with left sided cp.  She did take her normal ASA this morning.  She did feel like her heart was racing also.  The pt denies sob and nausea.  She has had 2 prior stress tests.  The last was over 3 years ago.   Heart score: 4      Past Medical History:  Diagnosis Date  . Asthma   . DJD (degenerative joint disease)   . GERD (gastroesophageal reflux disease)   . History of PSVT (paroxysmal supraventricular tachycardia)   . Hyperlipidemia   . Hypertension   . Osteopenia     Patient Active Problem List   Diagnosis Date Noted  . Asthma, chronic 09/28/2013  . HBP (high blood pressure) 09/28/2013    Past Surgical History:  Procedure Laterality Date  . [place] and 2004  . BREAST EXCISIONAL BIOPSY    . BREAST LUMPECTOMY    . CERVICAL POLYPECTOMY      OB History    No data available       Home Medications    Prior to Admission medications   Medication Sig Start Date End Date Taking? Authorizing Provider  atorvastatin (LIPITOR) 20 MG tablet Take 1 tablet by mouth daily. 09/02/13  Yes [provider]  b complex vitamins tablet Take 1 tablet by mouth daily.   Yes [provider]  butalbital-acetaminophen-caffeine (FIORICET, ESGIC) 50-325-40 MG per tablet Take 1 tablet by mouth 2 (two) times daily as needed for headache.   Yes [provider]  diltiazem (CARDIZEM) 120 MG tablet Take 120 mg by mouth daily.   Yes [provider]  fluticasone Asencion Islam) 50 MCG/ACT nasal spray  11/21/16  Yes [provider]  lisinopril-hydrochlorothiazide (PRINZIDE,ZESTORETIC) 20-12.5 MG per tablet Take 1 tablet by mouth daily. 09/02/13  Yes [provider]  loratadine (CLARITIN) 10 MG  tablet Take 10 mg by mouth daily.   Yes [provider]  mometasone-formoterol (DULERA) 100-5 MCG/ACT AERO Inhale 2 puffs into the lungs 2 (two) times daily. 12/12/16  Yes Rigoberto Noel, MD  montelukast (SINGULAIR) 10 MG tablet Take 1 tablet by mouth daily. 09/02/13  Yes [provider]  Multiple Vitamins-Minerals (CENTRUM SILVER PO) Take 1 tablet by mouth daily.   Yes [provider]  Multiple Vitamins-Minerals (ICAPS) CAPS Take 1 capsule by mouth daily.   Yes [provider]  Omega-3 Fatty Acids (FISH OIL PO) Take 1 tablet by mouth daily.   Yes [provider]  OVER THE COUNTER MEDICATION Osteospam (supplement for bone health) 2 capsules BID   Yes [provider]  PROAIR HFA 108 (90 BASE) MCG/ACT inhaler Inhale 2 puffs into the lungs every 4 (four) hours as needed. 09/02/13  Yes [provider]  ranitidine (ZANTAC) 75 MG tablet Take 150 mg by mouth 2 (two) times daily as needed for heartburn.   Yes [provider]    Family History Family History  Problem Relation Age of Onset  . Asthma Sister   . Allergies Sister   . Breast cancer Sister   . Colon cancer Unknown        neice at 71 years old  . Hypertension Unknown  multiple family members  . Kidney failure Father     Social History Social History  Substance Use Topics  . Smoking status: Never Smoker  . Smokeless tobacco: Never Used  . Alcohol use No     Allergies   Bactrim [sulfamethoxazole-trimethoprim]   Review of Systems Review of Systems  Cardiovascular: Positive for chest pain and palpitations.  All other systems reviewed and are negative.    Physical Exam Updated Vital Signs BP (!) 151/75   Pulse (!) 57   Temp 98.5 F (36.9 C) (Oral)   Resp 20   Ht 5\' 4"  (1.626 m)   Wt 54 kg (119 lb)   SpO2 97%   BMI 20.43 kg/m   Physical Exam  Constitutional: She is oriented to person, place, and time. She appears well-developed and  well-nourished.  HENT:  Head: Normocephalic and atraumatic.  Right Ear: External ear normal.  Left Ear: External ear normal.  Nose: Nose normal.  Mouth/Throat: Oropharynx is clear and moist.  Eyes: Pupils are equal, round, and reactive to light. Conjunctivae and EOM are normal.  Neck: Normal range of motion. Neck supple.  Cardiovascular: Normal rate, regular rhythm, normal heart sounds and intact distal pulses.   Pulmonary/Chest: Effort normal and breath sounds normal.  Abdominal: Soft. Bowel sounds are normal.  Musculoskeletal: Normal range of motion.  Neurological: She is alert and oriented to person, place, and time.  Skin: Skin is warm. Capillary refill takes less than 2 seconds.  Psychiatric: She has a normal mood and affect. Her behavior is normal. Judgment and thought content normal.  Nursing note and vitals reviewed.    ED Treatments / Results  Labs (all labs ordered are listed, but only abnormal results are displayed) Labs Reviewed  BASIC METABOLIC PANEL - Abnormal; Notable for the following:       Result Value   Glucose, Bld 101 (*)    GFR calc non Af Amer 57 (*)    All other components within normal limits  CBC  TROPONIN I  TROPONIN I    EKG  EKG Interpretation  Date/Time:  Wednesday April 23 2017 18:32:15 EDT Ventricular Rate:  81 PR Interval:    QRS Duration: 77 QT Interval:  381 QTC Calculation: 443 R Axis:   63 Text Interpretation:  Sinus arrhythmia Borderline prolonged PR interval Left atrial enlargement Confirmed by Isla Pence [phone]) on 04/23/2017 6:37:00 PM       Radiology Dg Chest 2 View  Result Date: 04/23/2017 CLINICAL DATA:  70 year old female with a history of left-sided chest pressure EXAM: CHEST  2 VIEW COMPARISON:  None. FINDINGS: Cardiomediastinal silhouette borderline enlarged. No evidence of central vascular congestion. No interlobular septal thickening. Coarsened interstitial markings with no comparison. No pneumothorax or  pleural effusion. No confluent airspace disease. No displaced fracture. IMPRESSION: Likely chronic lung changes without evidence of acute cardiopulmonary disease Electronically Signed   By: Corrie Mckusick D.O.   On: 04/23/2017 19:20    Procedures Procedures (including critical care time)  Medications Ordered in ED Medications  nitroGLYCERIN (NITROSTAT) SL tablet 0.4 mg (0.4 mg Sublingual Not Given 04/23/17 1906)  nitroGLYCERIN (NITROGLYN) 2 % ointment 0.5 inch (0.5 inches Topical Given 04/23/17 1913)     Initial Impression / Assessment and Plan / ED Course  I have reviewed the triage vital signs and the nursing notes.  Pertinent labs & imaging results that were available during my care of the patient were reviewed by me and considered in my medical decision making (see  chart for details).  Pt remains cp free while here.  I offered her admission for observation, but she declines.  She wants to go home and f/u with pcp.  She knows to return if worse.  Final Clinical Impressions(s) / ED Diagnoses   Final diagnoses:  Atypical chest pain    New Prescriptions New Prescriptions   No medications on file     Isla Pence, MD 04/23/17 2235

## 2017-04-23 NOTE — ED Notes (Signed)
Pt on monitor 

## 2017-10-20 ENCOUNTER — Other Ambulatory Visit: Payer: Self-pay | Admitting: Pulmonary Disease

## 2017-12-01 ENCOUNTER — Other Ambulatory Visit (HOSPITAL_BASED_OUTPATIENT_CLINIC_OR_DEPARTMENT_OTHER): Payer: Self-pay

## 2017-12-01 ENCOUNTER — Other Ambulatory Visit: Payer: Self-pay | Admitting: Obstetrics & Gynecology

## 2017-12-01 DIAGNOSIS — Z1231 Encounter for screening mammogram for malignant neoplasm of breast: Secondary | ICD-10-CM

## 2017-12-06 ENCOUNTER — Ambulatory Visit (HOSPITAL_BASED_OUTPATIENT_CLINIC_OR_DEPARTMENT_OTHER)
Admission: RE | Admit: 2017-12-06 | Discharge: 2017-12-06 | Disposition: A | Discharge status: Home / Self Care | Payer: Medicare Other | Source: Ambulatory Visit | Attending: Obstetrics & Gynecology | Admitting: Obstetrics & Gynecology

## 2017-12-06 ENCOUNTER — Encounter (INDEPENDENT_AMBULATORY_CARE_PROVIDER_SITE_OTHER): Payer: Self-pay

## 2017-12-06 ENCOUNTER — Encounter (HOSPITAL_BASED_OUTPATIENT_CLINIC_OR_DEPARTMENT_OTHER): Payer: Self-pay

## 2017-12-06 DIAGNOSIS — Z1231 Encounter for screening mammogram for malignant neoplasm of breast: Secondary | ICD-10-CM | POA: Diagnosis not present

## 2017-12-15 ENCOUNTER — Encounter: Payer: Self-pay | Admitting: Pulmonary Disease

## 2017-12-15 ENCOUNTER — Ambulatory Visit (INDEPENDENT_AMBULATORY_CARE_PROVIDER_SITE_OTHER): Payer: Medicare Other | Admitting: Pulmonary Disease

## 2017-12-15 VITALS — BP 124/68 | HR 73 | Ht 64.0 in | Wt 119.0 lb

## 2017-12-15 DIAGNOSIS — I10 Essential (primary) hypertension: Secondary | ICD-10-CM | POA: Diagnosis not present

## 2017-12-15 DIAGNOSIS — J45909 Unspecified asthma, uncomplicated: Secondary | ICD-10-CM

## 2017-12-15 NOTE — Assessment & Plan Note (Signed)
Better controlled on lisinopril which she seems to be tolerating

## 2017-12-15 NOTE — Assessment & Plan Note (Signed)
Lung function is stable. She will continue on Dulera at 100 mcg  She will continue on her allergy regimen with Singulair, Claritin and Flonase

## 2017-12-15 NOTE — Progress Notes (Signed)
   Subjective:    Patient ID: LAYNIE ESPY, female    DOB: July 17, 1945, 72 y.o.   MRN: [id number]  HPI  72 yo never smoker with asthma since the 1980's   Annual follow-up. On her last visit, we decreased dose of Dulera to 100 mg daily She had an uneventful ventilator, allergies are worse this season and she keeps a postnasal drip and occasional cough, she has needed albuterol about 3 times in the last month, denies nocturnal symptoms.  She is compliant with Claritin, Singulair and Flonase  Blood pressure is well controlled, she has been on lisinopril for many years. She inquires about a pneumonia shot, seems like she has got a Pneumovax does not sure whether she got Prevnar or not  Significant tests/ events reviewed  02/2014 Spirometry again showed moderate airway obstruction with FEV1 of 1.39-64% 01/2015 FEV1 63%  11/2016 FEV1 64%, ratio 70  12/2017 FEV1 64%  Review of Systems Patient denies significant dyspnea,cough, hemoptysis,  chest pain, palpitations, pedal edema, orthopnea, paroxysmal nocturnal dyspnea, lightheadedness, nausea, vomiting, abdominal or  leg pains      Objective:   Physical Exam   Gen. Pleasant, well-nourished, in no distress ENT - no thrush, no post nasal drip Neck: No JVD, no thyromegaly, no carotid bruits Lungs: no use of accessory muscles, no dullness to percussion, clear without rales or rhonchi  Cardiovascular: Rhythm regular, heart sounds  normal, no murmurs or gallops, no peripheral edema Musculoskeletal: No deformities, no cyanosis or clubbing         Assessment & Plan:

## 2017-12-15 NOTE — Patient Instructions (Addendum)
Check with your PCP if you have received Prevnar pneumonia vaccine.  Lung function is stable

## 2018-07-21 DIAGNOSIS — J329 Chronic sinusitis, unspecified: Secondary | ICD-10-CM | POA: Diagnosis not present

## 2018-07-21 DIAGNOSIS — Z760 Encounter for issue of repeat prescription: Secondary | ICD-10-CM | POA: Diagnosis not present

## 2018-07-21 DIAGNOSIS — B9689 Other specified bacterial agents as the cause of diseases classified elsewhere: Secondary | ICD-10-CM | POA: Diagnosis not present

## 2018-08-05 DIAGNOSIS — Z79899 Other long term (current) drug therapy: Secondary | ICD-10-CM | POA: Diagnosis not present

## 2018-08-05 DIAGNOSIS — R0602 Shortness of breath: Secondary | ICD-10-CM | POA: Diagnosis not present

## 2018-08-05 DIAGNOSIS — Z23 Encounter for immunization: Secondary | ICD-10-CM | POA: Diagnosis not present

## 2018-08-05 DIAGNOSIS — R739 Hyperglycemia, unspecified: Secondary | ICD-10-CM | POA: Diagnosis not present

## 2018-08-05 DIAGNOSIS — I10 Essential (primary) hypertension: Secondary | ICD-10-CM | POA: Diagnosis not present

## 2018-08-05 DIAGNOSIS — E7800 Pure hypercholesterolemia, unspecified: Secondary | ICD-10-CM | POA: Diagnosis not present

## 2018-08-07 DIAGNOSIS — E7800 Pure hypercholesterolemia, unspecified: Secondary | ICD-10-CM | POA: Diagnosis not present

## 2018-08-07 DIAGNOSIS — I10 Essential (primary) hypertension: Secondary | ICD-10-CM | POA: Diagnosis not present

## 2018-08-07 DIAGNOSIS — R0602 Shortness of breath: Secondary | ICD-10-CM | POA: Diagnosis not present

## 2018-08-07 DIAGNOSIS — M129 Arthropathy, unspecified: Secondary | ICD-10-CM | POA: Diagnosis not present

## 2018-08-07 DIAGNOSIS — M79643 Pain in unspecified hand: Secondary | ICD-10-CM | POA: Diagnosis not present

## 2018-08-07 DIAGNOSIS — M79646 Pain in unspecified finger(s): Secondary | ICD-10-CM | POA: Diagnosis not present

## 2018-08-12 DIAGNOSIS — J45909 Unspecified asthma, uncomplicated: Secondary | ICD-10-CM | POA: Diagnosis not present

## 2018-08-12 DIAGNOSIS — R350 Frequency of micturition: Secondary | ICD-10-CM | POA: Diagnosis not present

## 2018-08-12 DIAGNOSIS — I10 Essential (primary) hypertension: Secondary | ICD-10-CM | POA: Diagnosis not present

## 2018-08-12 DIAGNOSIS — Z7951 Long term (current) use of inhaled steroids: Secondary | ICD-10-CM | POA: Diagnosis not present

## 2018-08-19 DIAGNOSIS — M129 Arthropathy, unspecified: Secondary | ICD-10-CM | POA: Diagnosis not present

## 2018-08-19 DIAGNOSIS — I10 Essential (primary) hypertension: Secondary | ICD-10-CM | POA: Diagnosis not present

## 2018-08-19 DIAGNOSIS — M79643 Pain in unspecified hand: Secondary | ICD-10-CM | POA: Diagnosis not present

## 2018-08-19 DIAGNOSIS — M19049 Primary osteoarthritis, unspecified hand: Secondary | ICD-10-CM | POA: Diagnosis not present

## 2018-08-21 DIAGNOSIS — H35033 Hypertensive retinopathy, bilateral: Secondary | ICD-10-CM | POA: Diagnosis not present

## 2018-08-21 DIAGNOSIS — H353132 Nonexudative age-related macular degeneration, bilateral, intermediate dry stage: Secondary | ICD-10-CM | POA: Diagnosis not present

## 2018-08-21 DIAGNOSIS — H40013 Open angle with borderline findings, low risk, bilateral: Secondary | ICD-10-CM | POA: Diagnosis not present

## 2018-08-21 DIAGNOSIS — H25013 Cortical age-related cataract, bilateral: Secondary | ICD-10-CM | POA: Diagnosis not present

## 2018-09-14 ENCOUNTER — Ambulatory Visit (INDEPENDENT_AMBULATORY_CARE_PROVIDER_SITE_OTHER): Payer: PPO | Admitting: Obstetrics & Gynecology

## 2018-09-14 ENCOUNTER — Encounter: Payer: Self-pay | Admitting: Obstetrics & Gynecology

## 2018-09-14 VITALS — BP 126/76 | Ht 61.5 in | Wt 115.0 lb

## 2018-09-14 DIAGNOSIS — Z124 Encounter for screening for malignant neoplasm of cervix: Secondary | ICD-10-CM

## 2018-09-14 DIAGNOSIS — Z01419 Encounter for gynecological examination (general) (routine) without abnormal findings: Secondary | ICD-10-CM

## 2018-09-14 DIAGNOSIS — Z0000 Encounter for general adult medical examination without abnormal findings: Secondary | ICD-10-CM | POA: Diagnosis not present

## 2018-09-14 DIAGNOSIS — I10 Essential (primary) hypertension: Secondary | ICD-10-CM | POA: Diagnosis not present

## 2018-09-14 DIAGNOSIS — Z780 Asymptomatic menopausal state: Secondary | ICD-10-CM

## 2018-09-14 DIAGNOSIS — N9089 Other specified noninflammatory disorders of vulva and perineum: Secondary | ICD-10-CM

## 2018-09-14 NOTE — Progress Notes (Signed)
Denise Gonzales Apr 28, 1946 [id number]   History:    73 y.o. G3P3L3  RP:  Established patient presenting for annual gyn exam   HPI: Menopause, well on no HRT.  No PMB.  No pelvic pain.  Abstinent.  Urine and bowel movements normal.  Breast normal.  Good body mass index at 21.38.  Physically active.  Health labs with family physician.  Past medical history,surgical history, family history and social history were all reviewed and documented in the EPIC chart.  Gynecologic History No LMP recorded. Patient is postmenopausal. Contraception: abstinence and post menopausal status Last Pap: 2016. Results were: Negative Last mammogram: 11/2017. Results were: Negative Bone Density: Per patient, 2018, will schedule through her Fam MD Colonoscopy: 2018  Obstetric History OB History  Gravida Para Term Preterm AB Living  3 3       3   SAB TAB Ectopic Multiple Live Births               # Outcome Date GA Lbr Len/2nd Weight Sex Delivery Anes PTL Lv  3 Para           2 Para           1 Para              ROS: A ROS was performed and pertinent positives and negatives are included in the history.  GENERAL: No fevers or chills. HEENT: No change in vision, no earache, sore throat or sinus congestion. NECK: No pain or stiffness. CARDIOVASCULAR: No chest pain or pressure. No palpitations. PULMONARY: No shortness of breath, cough or wheeze. GASTROINTESTINAL: No abdominal pain, nausea, vomiting or diarrhea, melena or bright red blood per rectum. GENITOURINARY: No urinary frequency, urgency, hesitancy or dysuria. MUSCULOSKELETAL: No joint or muscle pain, no back pain, no recent trauma. DERMATOLOGIC: No rash, no itching, no lesions. ENDOCRINE: No polyuria, polydipsia, no heat or cold intolerance. No recent change in weight. HEMATOLOGICAL: No anemia or easy bruising or bleeding. NEUROLOGIC: No headache, seizures, numbness, tingling or weakness. PSYCHIATRIC: No depression, no loss of interest in normal activity or  change in sleep pattern.     Exam:   BP 126/76   Ht 5' 1.5" (1.562 m)   Wt 115 lb (52.2 kg)   BMI 21.38 kg/m   Body mass index is 21.38 kg/m.  General appearance : Well developed well nourished female. No acute distress HEENT: Eyes: no retinal hemorrhage or exudates,  Neck supple, trachea midline, no carotid bruits, no thyroidmegaly Lungs: Clear to auscultation, no rhonchi or wheezes, or rib retractions  Heart: Regular rate and rhythm, no murmurs or gallops Breast:Examined in sitting and supine position were symmetrical in appearance, no palpable masses or tenderness,  no skin retraction, no nipple inversion, no nipple discharge, no skin discoloration, no axillary or supraclavicular lymphadenopathy Abdomen: no palpable masses or tenderness, no rebound or guarding Extremities: no edema or skin discoloration or tenderness  Pelvic: Vulva: Right raised vulvar lesion about 0.5 x 1 cm.             Vagina: No gross lesions or discharge  Cervix: No gross lesions or discharge.  Pap reflex done.  Uterus  AV, normal size, shape and consistency, non-tender and mobile  Adnexa  Without masses or tenderness  Anus: Normal   Assessment/Plan:  73 y.o. female for annual exam   1. Encounter for routine gynecological examination with Papanicolaou smear of cervix Normal gynecologic exam in postmenopausal.  Pap reflex done.  Breast exam normal.  Last screening mammogram May 2019 was negative.  Good body mass index at 21.38.  Continue with fitness and healthy nutrition.  Health labs with family physician.  Colonoscopy in 2018.  2. Postmenopausal Well on no hormone replacement therapy.  No postmenopausal bleeding.  3. Right Vulvar lesion Raised right vulvar lesion.  Rule out dysplasia or malignancy process.  F/U excision of Rt vulvar lesion.  Counseling on above issues and coordination of care more than 50% for 10 minutes.  Princess Bruins MD, 1:01 PM 09/14/2018

## 2018-09-15 LAB — PAP IG W/ RFLX HPV ASCU

## 2018-09-21 ENCOUNTER — Encounter: Payer: Self-pay | Admitting: Obstetrics & Gynecology

## 2018-09-21 NOTE — Patient Instructions (Signed)
1. Encounter for routine gynecological examination with Papanicolaou smear of cervix Normal gynecologic exam in postmenopausal.  Pap reflex done.  Breast exam normal.  Last screening mammogram May 2019 was negative.  Good body mass index at 21.38.  Continue with fitness and healthy nutrition.  Health labs with family physician.  Colonoscopy in 2018.  2. Postmenopausal Well on no hormone replacement therapy.  No postmenopausal bleeding.  3. Right Vulvar lesion Raised right vulvar lesion.  Rule out dysplasia or malignancy process.  F/U excision of Rt vulvar lesion.  Kumari, it was a pleasure seeing you today!  I will inform you of your results as soon as they are available.

## 2018-09-30 ENCOUNTER — Telehealth: Payer: Self-pay | Admitting: Pulmonary Disease

## 2018-09-30 MED ORDER — FLUTICASONE-SALMETEROL 100-50 MCG/DOSE IN AEPB: 1.0000 | INHALATION_SPRAY | Freq: Two times a day (BID) | RESPIRATORY_TRACT | 3 refills | Status: DC

## 2018-09-30 NOTE — Telephone Encounter (Signed)
Spoke with the pt and notified of recs per Dr. Elsworth Soho  She verbalized understanding  Wants 90 day supply of Advair  Rx sent to preferred pharm

## 2018-09-30 NOTE — Telephone Encounter (Signed)
Called and spoke with patient. She stated that her insurance will no longer cover [facility] but they will cover Advair 100/5.   RA please advise, thank you.

## 2018-09-30 NOTE — Telephone Encounter (Signed)
Okay to fill Advair 100/50-1 puff twice daily, rinse mouth after use

## 2018-10-06 ENCOUNTER — Ambulatory Visit: Payer: PPO | Admitting: Obstetrics & Gynecology

## 2018-10-06 ENCOUNTER — Telehealth: Payer: Self-pay | Admitting: Pulmonary Disease

## 2018-10-06 NOTE — Telephone Encounter (Signed)
Call returned to patient, Tele Visit appt made at pt request for 1 year f/u. Voiced understanding. Nothing further is needed at this time.

## 2018-10-08 ENCOUNTER — Ambulatory Visit (INDEPENDENT_AMBULATORY_CARE_PROVIDER_SITE_OTHER): Payer: PPO | Admitting: Nurse Practitioner

## 2018-10-08 ENCOUNTER — Encounter: Payer: Self-pay | Admitting: Nurse Practitioner

## 2018-10-08 ENCOUNTER — Other Ambulatory Visit: Payer: Self-pay

## 2018-10-08 DIAGNOSIS — J45909 Unspecified asthma, uncomplicated: Secondary | ICD-10-CM | POA: Diagnosis not present

## 2018-10-08 NOTE — Progress Notes (Signed)
Virtual Visit via Telephone Note  I connected with Denise Gonzales on 10/08/18 at  9:30 AM EDT by telephone and verified that I am speaking with the correct person using two identifiers.   I discussed the limitations, risks, security and privacy concerns of performing an evaluation and management service by telephone and the availability of in person appointments. I also discussed with the patient that there may be a patient responsible charge related to this service. The patient expressed understanding and agreed to proceed.   History of Present Illness: 73 year old female prior smoker with asthma followed by Dr. Eartha Inch.  Patient has a scheduled tele-visit today for routine follow-up visit.  She was last seen by Dr. Elsworth Soho on 12/15/2017.  He states this is been a stable interval for her.  She was previously using Dulera but had to switch recently to Advair for insurance purposes.  She states that she has been doing well with her Advair.  She has been compliant with Claritin and Singulair and Flonase.  She uses albuterol as needed. Denies f/c/s, n/v/d, hemoptysis, PND, leg swelling.     Observations/Objective:  CXR 04/23/17 - Likely chronic lung changes without evidence of acute cardiopulmonary disease 02/2014 Spirometry again showed moderate airway obstruction with FEV1 of 1.39-64% 01/2015 FEV1 63%  11/2016 FEV1 64%, ratio 70  12/2017 FEV1 64%  Assessment and Plan: Patient Instructions  Continue Advair Continue albuterol as needed Continue on her allergy regimen with Singulair, Claritin and Flonase   Follow Up Instructions: Follow up with Dr. Elsworth Soho in 1 year or sooner if needed   I discussed the assessment and treatment plan with the patient. The patient was provided an opportunity to ask questions and all were answered. The patient agreed with the plan and demonstrated an understanding of the instructions.   The patient was advised to call back or seek an in-person evaluation if the symptoms  worsen or if the condition fails to improve as anticipated.  I provided 22 minutes of non-face-to-face time during this encounter.   Fenton Foy, NP

## 2018-10-08 NOTE — Patient Instructions (Addendum)
Continue Advair Continue albuterol as needed Continue on her allergy regimen with Singulair, Claritin and Flonase  Follow up: Follow up with Dr. Elsworth Soho in 1 year or sooner if needed

## 2018-10-08 NOTE — Assessment & Plan Note (Addendum)
Patient has a scheduled tele-visit today for routine follow-up visit.  She was last seen by Dr. Elsworth Soho on 12/15/2017.  He states this is been a stable interval for her.  She was previously using Dulera but had to switch recently to Advair for insurance purposes.  She states that she has been doing well with her Advair.  She has been compliant with Claritin and Singulair and Flonase.  She uses albuterol as needed.   Patient Instructions  Continue Advair Continue albuterol as needed Continue on her allergy regimen with Singulair, Claritin and Flonase  Follow up: Follow up with Dr. Elsworth Soho in 1 year or sooner if needed

## 2018-10-09 ENCOUNTER — Ambulatory Visit: Payer: Medicare Other | Admitting: Pulmonary Disease

## 2018-10-12 ENCOUNTER — Encounter: Payer: Self-pay | Admitting: Obstetrics & Gynecology

## 2018-11-09 ENCOUNTER — Other Ambulatory Visit: Payer: Self-pay

## 2018-11-11 ENCOUNTER — Encounter: Payer: Self-pay | Admitting: Obstetrics & Gynecology

## 2018-11-11 ENCOUNTER — Ambulatory Visit (INDEPENDENT_AMBULATORY_CARE_PROVIDER_SITE_OTHER): Payer: PPO | Admitting: Obstetrics & Gynecology

## 2018-11-11 ENCOUNTER — Other Ambulatory Visit: Payer: Self-pay

## 2018-11-11 VITALS — BP 146/84

## 2018-11-11 DIAGNOSIS — N9089 Other specified noninflammatory disorders of vulva and perineum: Secondary | ICD-10-CM

## 2018-11-11 DIAGNOSIS — D280 Benign neoplasm of vulva: Secondary | ICD-10-CM

## 2018-11-11 NOTE — Progress Notes (Signed)
    Denise Gonzales August 27, 1945 [id number]        72 y.o.  G3P3L3 Married  RP: Vulvar lesion for biopsy  HPI:  Right raised vulvar lesion unchanged per patient since last visit.   OB History  Gravida Para Term Preterm AB Living  3 3       3   SAB TAB Ectopic Multiple Live Births               # Outcome Date GA Lbr Len/2nd Weight Sex Delivery Anes PTL Lv  3 Para           2 Para           1 Para             Past medical history,surgical history, problem list, medications, allergies, family history and social history were all reviewed and documented in the EPIC chart.   Directed ROS with pertinent positives and negatives documented in the history of present illness/assessment and plan.  Exam:  Vitals:   11/11/18 0939  BP: (!) 146/84   General appearance:  Normal  Gynecologic exam:  Vulva:  Right raised irregular lesion 1 x 1.5 cm.  Verbal consent for excisional biopsy obtained.  Betadine prep.  Local anesthesia with Lidocaine 1%.  Excision of Rt vulvar lesion with the scalpel.  Specimen sent to pathology.  Closure with separate stitches of Vicryl 4-0.  Good hemostasis.  No complication.  Well tolerated.   Assessment/Plan:  73 y.o. G3P3L3  1. Vulvar lesion Right raised the persistent vulvar lesion excised today.  No complication and well-tolerated by patient.  Specimen sent to pathology.  Management per results.  Denise Bruins MD, 9:54 AM 11/11/2018

## 2018-11-11 NOTE — Patient Instructions (Signed)
1. Vulvar lesion Right raised the persistent vulvar lesion excised today.  No complication and well-tolerated by patient.  Specimen sent to pathology.  Management per results.  Mercy, it was a pleasure seeing you today!  I will inform you of your results as soon as they are available.

## 2018-11-12 LAB — PATHOLOGY REPORT

## 2018-11-12 LAB — TISSUE SPECIMEN

## 2018-12-28 DIAGNOSIS — R739 Hyperglycemia, unspecified: Secondary | ICD-10-CM | POA: Diagnosis not present

## 2018-12-28 DIAGNOSIS — Z79899 Other long term (current) drug therapy: Secondary | ICD-10-CM | POA: Diagnosis not present

## 2018-12-28 DIAGNOSIS — I6523 Occlusion and stenosis of bilateral carotid arteries: Secondary | ICD-10-CM | POA: Diagnosis not present

## 2018-12-28 DIAGNOSIS — E7800 Pure hypercholesterolemia, unspecified: Secondary | ICD-10-CM | POA: Diagnosis not present

## 2018-12-28 DIAGNOSIS — I10 Essential (primary) hypertension: Secondary | ICD-10-CM | POA: Diagnosis not present

## 2019-01-11 DIAGNOSIS — I10 Essential (primary) hypertension: Secondary | ICD-10-CM | POA: Diagnosis not present

## 2019-01-11 DIAGNOSIS — M5416 Radiculopathy, lumbar region: Secondary | ICD-10-CM | POA: Diagnosis not present

## 2019-01-11 DIAGNOSIS — E782 Mixed hyperlipidemia: Secondary | ICD-10-CM | POA: Diagnosis not present

## 2019-01-19 ENCOUNTER — Other Ambulatory Visit (HOSPITAL_BASED_OUTPATIENT_CLINIC_OR_DEPARTMENT_OTHER): Payer: Self-pay | Admitting: Obstetrics & Gynecology

## 2019-01-19 DIAGNOSIS — Z1231 Encounter for screening mammogram for malignant neoplasm of breast: Secondary | ICD-10-CM

## 2019-01-20 ENCOUNTER — Ambulatory Visit (HOSPITAL_BASED_OUTPATIENT_CLINIC_OR_DEPARTMENT_OTHER)
Admission: RE | Admit: 2019-01-20 | Discharge: 2019-01-20 | Disposition: A | Discharge status: Home / Self Care | Payer: PPO | Source: Ambulatory Visit | Attending: Obstetrics & Gynecology | Admitting: Obstetrics & Gynecology

## 2019-01-20 ENCOUNTER — Other Ambulatory Visit: Payer: Self-pay

## 2019-01-20 DIAGNOSIS — Z1231 Encounter for screening mammogram for malignant neoplasm of breast: Secondary | ICD-10-CM | POA: Diagnosis not present

## 2019-01-22 DIAGNOSIS — E041 Nontoxic single thyroid nodule: Secondary | ICD-10-CM | POA: Diagnosis not present

## 2019-02-08 DIAGNOSIS — H40013 Open angle with borderline findings, low risk, bilateral: Secondary | ICD-10-CM | POA: Diagnosis not present

## 2019-02-08 DIAGNOSIS — H2513 Age-related nuclear cataract, bilateral: Secondary | ICD-10-CM | POA: Diagnosis not present

## 2019-02-08 DIAGNOSIS — H35033 Hypertensive retinopathy, bilateral: Secondary | ICD-10-CM | POA: Diagnosis not present

## 2019-02-08 DIAGNOSIS — H353132 Nonexudative age-related macular degeneration, bilateral, intermediate dry stage: Secondary | ICD-10-CM | POA: Diagnosis not present

## 2019-02-08 DIAGNOSIS — H524 Presbyopia: Secondary | ICD-10-CM | POA: Diagnosis not present

## 2019-05-05 DIAGNOSIS — E782 Mixed hyperlipidemia: Secondary | ICD-10-CM | POA: Diagnosis not present

## 2019-05-05 DIAGNOSIS — Z7189 Other specified counseling: Secondary | ICD-10-CM | POA: Diagnosis not present

## 2019-05-05 DIAGNOSIS — J301 Allergic rhinitis due to pollen: Secondary | ICD-10-CM | POA: Diagnosis not present

## 2019-05-05 DIAGNOSIS — I10 Essential (primary) hypertension: Secondary | ICD-10-CM | POA: Diagnosis not present

## 2019-05-05 DIAGNOSIS — Z23 Encounter for immunization: Secondary | ICD-10-CM | POA: Diagnosis not present

## 2019-05-11 DIAGNOSIS — I10 Essential (primary) hypertension: Secondary | ICD-10-CM | POA: Diagnosis not present

## 2019-05-11 DIAGNOSIS — E782 Mixed hyperlipidemia: Secondary | ICD-10-CM | POA: Diagnosis not present

## 2019-05-11 DIAGNOSIS — R609 Edema, unspecified: Secondary | ICD-10-CM | POA: Diagnosis not present

## 2019-05-11 DIAGNOSIS — Z7189 Other specified counseling: Secondary | ICD-10-CM | POA: Diagnosis not present

## 2019-05-11 DIAGNOSIS — I739 Peripheral vascular disease, unspecified: Secondary | ICD-10-CM | POA: Diagnosis not present

## 2019-05-11 DIAGNOSIS — M79606 Pain in leg, unspecified: Secondary | ICD-10-CM | POA: Diagnosis not present

## 2019-05-12 ENCOUNTER — Other Ambulatory Visit: Payer: Self-pay

## 2019-05-12 ENCOUNTER — Ambulatory Visit: Payer: PPO | Admitting: Pulmonary Disease

## 2019-05-12 ENCOUNTER — Encounter: Payer: Self-pay | Admitting: Pulmonary Disease

## 2019-05-12 DIAGNOSIS — J4530 Mild persistent asthma, uncomplicated: Secondary | ICD-10-CM

## 2019-05-12 DIAGNOSIS — I10 Essential (primary) hypertension: Secondary | ICD-10-CM | POA: Diagnosis not present

## 2019-05-12 NOTE — Progress Notes (Signed)
   Subjective:    Patient ID: Denise Gonzales, female    DOB: 07-Oct-1945, 73 y.o.   MRN: [id number]  HPI  73 yo never smoker with asthma since the 1980's   She was changed from [facility] to Advair earlier in the year and has been tolerating this. Asthma appears to be well controlled -25 on ACT   She is compliant with Singulair, Flonase  She has been social distancing and using her mask during the pandemic, no sick contacts  Last exacerbation was many years ago  Significant tests/ events reviewed  02/2014 Spirometry again showed moderate airway obstruction with FEV1 of 1.39-64% 01/2015 FEV1 63%  11/2016 FEV1 64%, ratio 70  12/2017 FEV1 64%  Review of Systems Patient denies significant dyspnea,cough, hemoptysis,  chest pain, palpitations, pedal edema, orthopnea, paroxysmal nocturnal dyspnea, lightheadedness, nausea, vomiting, abdominal or  leg pains      Objective:   Physical Exam   Gen. Pleasant, well-nourished, in no distress ENT - no thrush, no pallor/icterus,no post nasal drip Neck: No JVD, no thyromegaly, no carotid bruits Lungs: no use of accessory muscles, no dullness to percussion, clear without rales or rhonchi  Cardiovascular: Rhythm regular, heart sounds  normal, no murmurs or gallops, no peripheral edema Musculoskeletal: No deformities, no cyanosis or clubbing         Assessment & Plan:

## 2019-05-12 NOTE — Assessment & Plan Note (Signed)
Asthma appears well controlled.  Stay on Advair  Continue Singulair, Flonase and Claritin  Repeat spirometry next visit

## 2019-05-12 NOTE — Assessment & Plan Note (Signed)
No side effects of lisinopril such as cough

## 2019-05-12 NOTE — Patient Instructions (Signed)
Asthma appears well controlled.  Stay on Advair  Continue Singulair, Flonase and Claritin

## 2019-06-11 DIAGNOSIS — I10 Essential (primary) hypertension: Secondary | ICD-10-CM | POA: Diagnosis not present

## 2019-06-11 DIAGNOSIS — E782 Mixed hyperlipidemia: Secondary | ICD-10-CM | POA: Diagnosis not present

## 2019-08-23 DIAGNOSIS — H35033 Hypertensive retinopathy, bilateral: Secondary | ICD-10-CM | POA: Diagnosis not present

## 2019-08-23 DIAGNOSIS — H2513 Age-related nuclear cataract, bilateral: Secondary | ICD-10-CM | POA: Diagnosis not present

## 2019-08-23 DIAGNOSIS — H353132 Nonexudative age-related macular degeneration, bilateral, intermediate dry stage: Secondary | ICD-10-CM | POA: Diagnosis not present

## 2019-08-23 DIAGNOSIS — H25013 Cortical age-related cataract, bilateral: Secondary | ICD-10-CM | POA: Diagnosis not present

## 2019-09-01 ENCOUNTER — Other Ambulatory Visit: Payer: Self-pay | Admitting: Pulmonary Disease

## 2019-09-10 DIAGNOSIS — I10 Essential (primary) hypertension: Secondary | ICD-10-CM | POA: Diagnosis not present

## 2019-09-10 DIAGNOSIS — E782 Mixed hyperlipidemia: Secondary | ICD-10-CM | POA: Diagnosis not present

## 2019-09-10 DIAGNOSIS — L989 Disorder of the skin and subcutaneous tissue, unspecified: Secondary | ICD-10-CM | POA: Diagnosis not present

## 2019-09-10 DIAGNOSIS — J301 Allergic rhinitis due to pollen: Secondary | ICD-10-CM | POA: Diagnosis not present

## 2019-10-18 DIAGNOSIS — M129 Arthropathy, unspecified: Secondary | ICD-10-CM | POA: Diagnosis not present

## 2019-10-18 DIAGNOSIS — H9202 Otalgia, left ear: Secondary | ICD-10-CM | POA: Diagnosis not present

## 2019-10-18 DIAGNOSIS — J309 Allergic rhinitis, unspecified: Secondary | ICD-10-CM | POA: Diagnosis not present

## 2019-10-18 DIAGNOSIS — M19049 Primary osteoarthritis, unspecified hand: Secondary | ICD-10-CM | POA: Diagnosis not present

## 2019-10-27 ENCOUNTER — Other Ambulatory Visit: Payer: Self-pay

## 2019-10-27 DIAGNOSIS — M79672 Pain in left foot: Secondary | ICD-10-CM | POA: Diagnosis not present

## 2019-10-27 DIAGNOSIS — R21 Rash and other nonspecific skin eruption: Secondary | ICD-10-CM | POA: Diagnosis not present

## 2019-10-28 ENCOUNTER — Ambulatory Visit (INDEPENDENT_AMBULATORY_CARE_PROVIDER_SITE_OTHER): Payer: PPO | Admitting: Obstetrics & Gynecology

## 2019-10-28 ENCOUNTER — Encounter: Payer: Self-pay | Admitting: Obstetrics & Gynecology

## 2019-10-28 VITALS — BP 130/78 | Ht 61.5 in | Wt 115.6 lb

## 2019-10-28 DIAGNOSIS — Z780 Asymptomatic menopausal state: Secondary | ICD-10-CM | POA: Diagnosis not present

## 2019-10-28 DIAGNOSIS — N951 Menopausal and female climacteric states: Secondary | ICD-10-CM

## 2019-10-28 DIAGNOSIS — Z01419 Encounter for gynecological examination (general) (routine) without abnormal findings: Secondary | ICD-10-CM

## 2019-10-28 NOTE — Progress Notes (Signed)
Denise Gonzales 12-10-1945 [id number]   History:    74 y.o. G3P3L3  RP:  Established patient presenting for annual gyn exam   HPI: Menopause, well on no HRT.  No PMB.  No pelvic pain.  Abstinent.  Urine and bowel movements normal.  Breast normal.  Good body mass index at 21.49.  Physically active.  Health labs with family physician.  Past medical history,surgical history, family history and social history were all reviewed and documented in the EPIC chart.  Gynecologic History No LMP recorded. Patient is postmenopausal. C Obstetric History OB History  Gravida Para Term Preterm AB Living  3 3       3   SAB TAB Ectopic Multiple Live Births               # Outcome Date GA Lbr Len/2nd Weight Sex Delivery Anes PTL Lv  3 Para           2 Para           1 Para              ROS: A ROS was performed and pertinent positives and negatives are included in the history.  GENERAL: No fevers or chills. HEENT: No change in vision, no earache, sore throat or sinus congestion. NECK: No pain or stiffness. CARDIOVASCULAR: No chest pain or pressure. No palpitations. PULMONARY: No shortness of breath, cough or wheeze. GASTROINTESTINAL: No abdominal pain, nausea, vomiting or diarrhea, melena or bright red blood per rectum. GENITOURINARY: No urinary frequency, urgency, hesitancy or dysuria. MUSCULOSKELETAL: No joint or muscle pain, no back pain, no recent trauma. DERMATOLOGIC: No rash, no itching, no lesions. ENDOCRINE: No polyuria, polydipsia, no heat or cold intolerance. No recent change in weight. HEMATOLOGICAL: No anemia or easy bruising or bleeding. NEUROLOGIC: No headache, seizures, numbness, tingling or weakness. PSYCHIATRIC: No depression, no loss of interest in normal activity or change in sleep pattern.     Exam:   BP 130/78   Ht 5' 1.5" (1.562 m)   Wt 115 lb 9.6 oz (52.4 kg)   BMI 21.49 kg/m   Body mass index is 21.49 kg/m.  General appearance : Well developed well nourished female. No  acute distress HEENT: Eyes: no retinal hemorrhage or exudates,  Neck supple, trachea midline, no carotid bruits, no thyroidmegaly Lungs: Clear to auscultation, no rhonchi or wheezes, or rib retractions  Heart: Regular rate and rhythm, no murmurs or gallops Breast:Examined in sitting and supine position were symmetrical in appearance, no palpable masses or tenderness,  no skin retraction, no nipple inversion, no nipple discharge, no skin discoloration, no axillary or supraclavicular lymphadenopathy Abdomen: no palpable masses or tenderness, no rebound or guarding Extremities: no edema or skin discoloration or tenderness  Pelvic: Vulva: Normal             Vagina: No gross lesions or discharge  Cervix: No gross lesions or discharge.  Pap reflex done.  Uterus  AV, normal size, shape and consistency, non-tender and mobile  Adnexa  Without masses or tenderness  Anus: Normal   Assessment/Plan:  74 y.o. female for annual exam   1. Encounter for routine gynecological examination with Papanicolaou smear of cervix Normal gynecologic exam in menopause.  Pap reflex done.  Breast exam normal.  Screening mammogram July 2020 was negative.  Colonoscopy 2018.  Health labs with family physician.  Good body mass index at 21.49.  Continue with fitness and healthy nutrition.  2. Postmenopausal Postmenopausal, well on no  hormone replacement therapy.  No postmenopausal bleeding.  Bone densities through her family physician.  Vitamin D supplements, calcium intake of 1200 mg daily and regular weightbearing physical activity is recommended.  Princess Bruins MD, 11:25 AM 10/28/2019

## 2019-10-28 NOTE — Patient Instructions (Signed)
1. Encounter for routine gynecological examination with Papanicolaou smear of cervix Normal gynecologic exam in menopause.  Pap reflex done.  Breast exam normal.  Screening mammogram July 2020 was negative.  Colonoscopy 2018.  Health labs with family physician.  Good body mass index at 21.49.  Continue with fitness and healthy nutrition.  2. Postmenopausal Postmenopausal, well on no hormone replacement therapy.  No postmenopausal bleeding.  Bone densities through her family physician.  Vitamin D supplements, calcium intake of 1200 mg daily and regular weightbearing physical activity is recommended.  Denise Gonzales, it was a pleasure seeing you today!  I will inform you of your results as soon as they are available.

## 2019-10-29 DIAGNOSIS — Z01419 Encounter for gynecological examination (general) (routine) without abnormal findings: Secondary | ICD-10-CM | POA: Diagnosis not present

## 2019-10-29 NOTE — Addendum Note (Signed)
Addended by: Thurnell Garbe A on: 10/29/2019 08:06 AM   Modules accepted: Orders

## 2019-11-02 LAB — PAP IG W/ RFLX HPV ASCU

## 2019-11-10 ENCOUNTER — Other Ambulatory Visit: Payer: Self-pay

## 2019-11-10 ENCOUNTER — Ambulatory Visit: Payer: PPO | Admitting: Adult Health

## 2019-11-10 ENCOUNTER — Encounter: Payer: Self-pay | Admitting: Adult Health

## 2019-11-10 DIAGNOSIS — J309 Allergic rhinitis, unspecified: Secondary | ICD-10-CM | POA: Diagnosis not present

## 2019-11-10 DIAGNOSIS — J4530 Mild persistent asthma, uncomplicated: Secondary | ICD-10-CM

## 2019-11-10 NOTE — Assessment & Plan Note (Signed)
Controlled on present regimen

## 2019-11-10 NOTE — Patient Instructions (Signed)
Continue on Advair 1 puff .Twice daily  , rinse after use.  Continue on Singulair , Flonase, Claritin .  Activity as tolerated.  Albuterol Inhaler As needed  Wheezing .  Follow up with Dr. Elsworth Soho  In 6 months and As needed

## 2019-11-10 NOTE — Assessment & Plan Note (Signed)
Mild persistent asthma-excellent control on present regimen.  Continue on trigger prevention  Plan  Patient Instructions  Continue on Advair 1 puff .Twice daily  , rinse after use.  Continue on Singulair , Flonase, Claritin .  Activity as tolerated.  Albuterol Inhaler As needed  Wheezing .  Follow up with Dr. Elsworth Soho  In 6 months and As needed

## 2019-11-10 NOTE — Progress Notes (Signed)
@Patient  ID: Denise Gonzales, female    DOB: 02/24/1946, 74 y.o.   MRN: [id number]  Chief Complaint  Patient presents with  . Follow-up    Asthma     Referring provider: Gwendel Hanson  HPI: 74 year old female never smoker followed for asthma  TEST/EVENTS :  02/2014 Spirometry again showed moderate airway obstruction with FEV1 of 1.39-64% 01/2015 FEV1 63%  11/2016 FEV1 64%, ratio 70  12/2017 FEV1 64%  11/10/2019 Follow up : Asthma and AR  Patient presents for a 93-month follow-up.  Patient has underlying moderate persistent asthma.  Patient remains on Advair twice daily.  She is on Singulair and Claritin.  Patient is on ACE inhibitor.  She denies any cough.  Patient denies any increased shortness of breath wheezing or cough.  Has rare use of her albuterol. Stays busy at home , no issues with activity tolerance.  Allergy symptoms under control . Claritin  And flonase really helps   Declines Covid vaccine.  Allergies  Allergen Reactions  . Bactrim [Sulfamethoxazole-Trimethoprim] Other (See Comments)    Pt felt like she was having a heart attack.     Immunization History  Administered Date(s) Administered  . Influenza Split 04/14/2013  . Influenza Whole 04/26/2019    Past Medical History:  Diagnosis Date  . Asthma   . DJD (degenerative joint disease)   . GERD (gastroesophageal reflux disease)   . History of PSVT (paroxysmal supraventricular tachycardia)   . Hyperlipidemia   . Hypertension   . Osteopenia     Tobacco History: Social History   Tobacco Use  Smoking Status Never Smoker  Smokeless Tobacco Never Used   Counseling given: Not Answered   Outpatient Medications Prior to Visit  Medication Sig Dispense Refill  . atorvastatin (LIPITOR) 20 MG tablet Take 1 tablet by mouth daily.    Marland Kitchen b complex vitamins tablet Take 1 tablet by mouth daily.    . butalbital-acetaminophen-caffeine (FIORICET, ESGIC) 50-325-40 MG per tablet Take 1 tablet by mouth 2 (two)  times daily as needed for headache.    . diltiazem (CARDIZEM CD) 240 MG 24 hr capsule Take 240 mg by mouth daily.    . fluticasone (FLONASE) 50 MCG/ACT nasal spray     . Fluticasone-Salmeterol (ADVAIR) 100-50 MCG/DOSE AEPB INHALE ONE PUFF BY MOUTH TWICE A DAY 180 each 0  . hydrochlorothiazide (HYDRODIURIL) 12.5 MG tablet Take 12.5 mg by mouth daily.    Marland Kitchen lisinopril (PRINIVIL,ZESTRIL) 40 MG tablet Take 40 mg by mouth daily.    Marland Kitchen loratadine (CLARITIN) 10 MG tablet Take 10 mg by mouth daily.    . montelukast (SINGULAIR) 10 MG tablet Take 1 tablet by mouth daily.    . Multiple Vitamins-Minerals (CENTRUM SILVER PO) Take 1 tablet by mouth daily.    . Multiple Vitamins-Minerals (ICAPS) CAPS Take 1 capsule by mouth daily.    . Omega-3 Fatty Acids (FISH OIL PO) Take 1 tablet by mouth daily.    Marland Kitchen OVER THE COUNTER MEDICATION Osteospam (supplement for bone health) 2 capsules BID    . PROAIR HFA 108 (90 BASE) MCG/ACT inhaler Inhale 2 puffs into the lungs every 4 (four) hours as needed.    Marland Kitchen omeprazole (PRILOSEC) 40 MG capsule Take 40 mg by mouth daily.     No facility-administered medications prior to visit.     Review of Systems:   Constitutional:   No  weight loss, night sweats,  Fevers, chills, fatigue, or  lassitude.  HEENT:   No  headaches,  Difficulty swallowing,  Tooth/dental problems, or  Sore throat,                No sneezing, itching, ear ache,  +nasal congestion, post nasal drip,   CV:  No chest pain,  Orthopnea, PND, swelling in lower extremities, anasarca, dizziness, palpitations, syncope.   GI  No heartburn, indigestion, abdominal pain, nausea, vomiting, diarrhea, change in bowel habits, loss of appetite, bloody stools.   Resp: No shortness of breath with exertion or at rest.  No excess mucus, no productive cough,  No non-productive cough,  No coughing up of blood.  No change in color of mucus.  No wheezing.  No chest wall deformity  Skin: no rash or lesions.  GU: no dysuria,  change in color of urine, no urgency or frequency.  No flank pain, no hematuria   MS:  No joint pain or swelling.  No decreased range of motion.  No back pain.    Physical Exam  Pulse 78   Temp 98 F (36.7 C) (Temporal)   Ht 5' 1.5" (1.562 m)   Wt 116 lb 9.6 oz (52.9 kg)   SpO2 98% Comment: on RA  BMI 21.67 kg/m   GEN: A/Ox3; pleasant , NAD, well nourished    HEENT:  [place]/AT,  , NOSE-clear, THROAT-clear, no lesions, no postnasal drip or exudate noted.   NECK:  Supple w/ fair ROM; no JVD; normal carotid impulses w/o bruits; no thyromegaly or nodules palpated; no lymphadenopathy.    RESP  Clear  P & A; w/o, wheezes/ rales/ or rhonchi. no accessory muscle use, no dullness to percussion  CARD:  RRR, no m/r/g, no peripheral edema, pulses intact, no cyanosis or clubbing.  GI:   Soft & nt; nml bowel sounds; no organomegaly or masses detected.   Musco: Warm bil, no deformities or joint swelling noted.   Neuro: alert, no focal deficits noted.    Skin: Warm, no lesions or rashes    Lab Results:  CBC  BNP No results found for: BNP  ProBNP No results found for: PROBNP  Imaging: No results found.    No flowsheet data found.  No results found for: NITRICOXIDE      Assessment & Plan:   No problem-specific Assessment & Plan notes found for this encounter.     Rexene Edison, NP 11/10/2019

## 2019-12-08 DIAGNOSIS — Z0000 Encounter for general adult medical examination without abnormal findings: Secondary | ICD-10-CM | POA: Diagnosis not present

## 2019-12-08 DIAGNOSIS — Z1331 Encounter for screening for depression: Secondary | ICD-10-CM | POA: Diagnosis not present

## 2019-12-08 DIAGNOSIS — I10 Essential (primary) hypertension: Secondary | ICD-10-CM | POA: Diagnosis not present

## 2019-12-08 DIAGNOSIS — E782 Mixed hyperlipidemia: Secondary | ICD-10-CM | POA: Diagnosis not present

## 2019-12-08 DIAGNOSIS — R739 Hyperglycemia, unspecified: Secondary | ICD-10-CM | POA: Diagnosis not present

## 2019-12-17 DIAGNOSIS — I10 Essential (primary) hypertension: Secondary | ICD-10-CM | POA: Diagnosis not present

## 2019-12-17 DIAGNOSIS — M5030 Other cervical disc degeneration, unspecified cervical region: Secondary | ICD-10-CM | POA: Diagnosis not present

## 2019-12-17 DIAGNOSIS — M542 Cervicalgia: Secondary | ICD-10-CM | POA: Diagnosis not present

## 2019-12-17 DIAGNOSIS — E782 Mixed hyperlipidemia: Secondary | ICD-10-CM | POA: Diagnosis not present

## 2019-12-26 ENCOUNTER — Encounter (HOSPITAL_BASED_OUTPATIENT_CLINIC_OR_DEPARTMENT_OTHER): Payer: Self-pay | Admitting: Emergency Medicine

## 2019-12-26 ENCOUNTER — Other Ambulatory Visit: Payer: Self-pay

## 2019-12-26 DIAGNOSIS — J45909 Unspecified asthma, uncomplicated: Secondary | ICD-10-CM | POA: Insufficient documentation

## 2019-12-26 DIAGNOSIS — I609 Nontraumatic subarachnoid hemorrhage, unspecified: Secondary | ICD-10-CM | POA: Insufficient documentation

## 2019-12-26 DIAGNOSIS — M542 Cervicalgia: Secondary | ICD-10-CM | POA: Diagnosis not present

## 2019-12-26 DIAGNOSIS — Z79899 Other long term (current) drug therapy: Secondary | ICD-10-CM | POA: Diagnosis not present

## 2019-12-26 DIAGNOSIS — Z7951 Long term (current) use of inhaled steroids: Secondary | ICD-10-CM | POA: Insufficient documentation

## 2019-12-26 DIAGNOSIS — M436 Torticollis: Secondary | ICD-10-CM | POA: Diagnosis not present

## 2019-12-26 DIAGNOSIS — I10 Essential (primary) hypertension: Secondary | ICD-10-CM | POA: Insufficient documentation

## 2019-12-26 NOTE — ED Triage Notes (Signed)
Patient presents with complaints of neck pain; denies any known injury; states seen a [place] 1 week ago for same; states BP elevated this evening at home as well. Ambulatory with steady gait.

## 2019-12-27 ENCOUNTER — Emergency Department (HOSPITAL_BASED_OUTPATIENT_CLINIC_OR_DEPARTMENT_OTHER)
Admission: EM | Admit: 2019-12-27 | Discharge: 2019-12-27 | Disposition: A | Discharge status: Home / Self Care | Payer: PPO | Attending: Emergency Medicine | Admitting: Emergency Medicine

## 2019-12-27 ENCOUNTER — Encounter (HOSPITAL_BASED_OUTPATIENT_CLINIC_OR_DEPARTMENT_OTHER): Payer: Self-pay

## 2019-12-27 ENCOUNTER — Emergency Department (HOSPITAL_BASED_OUTPATIENT_CLINIC_OR_DEPARTMENT_OTHER): Payer: PPO

## 2019-12-27 DIAGNOSIS — I10 Essential (primary) hypertension: Secondary | ICD-10-CM

## 2019-12-27 DIAGNOSIS — M436 Torticollis: Secondary | ICD-10-CM | POA: Diagnosis not present

## 2019-12-27 DIAGNOSIS — Z09 Encounter for follow-up examination after completed treatment for conditions other than malignant neoplasm: Secondary | ICD-10-CM | POA: Diagnosis not present

## 2019-12-27 DIAGNOSIS — M5030 Other cervical disc degeneration, unspecified cervical region: Secondary | ICD-10-CM | POA: Diagnosis not present

## 2019-12-27 DIAGNOSIS — M542 Cervicalgia: Secondary | ICD-10-CM | POA: Diagnosis not present

## 2019-12-27 LAB — CBC WITH DIFFERENTIAL/PLATELET
Abs Immature Granulocytes: 0.05 10*3/uL (ref 0.00–0.07)
Basophils Absolute: 0 10*3/uL (ref 0.0–0.1)
Basophils Relative: 0 %
Eosinophils Absolute: 0.1 10*3/uL (ref 0.0–0.5)
Eosinophils Relative: 1 %
HCT: 36.5 % (ref 36.0–46.0)
Hemoglobin: 11.9 g/dL — ABNORMAL LOW (ref 12.0–15.0)
Immature Granulocytes: 1 %
Lymphocytes Relative: 10 %
Lymphs Abs: 1 10*3/uL (ref 0.7–4.0)
MCH: 28.6 pg (ref 26.0–34.0)
MCHC: 32.6 g/dL (ref 30.0–36.0)
MCV: 87.7 fL (ref 80.0–100.0)
Monocytes Absolute: 0.7 10*3/uL (ref 0.1–1.0)
Monocytes Relative: 7 %
Neutro Abs: 8.3 10*3/uL — ABNORMAL HIGH (ref 1.7–7.7)
Neutrophils Relative %: 81 %
Platelets: 282 10*3/uL (ref 150–400)
RBC: 4.16 MIL/uL (ref 3.87–5.11)
RDW: 13.6 % (ref 11.5–15.5)
WBC: 10.2 10*3/uL (ref 4.0–10.5)
nRBC: 0 % (ref 0.0–0.2)

## 2019-12-27 LAB — BASIC METABOLIC PANEL
Anion gap: 9 (ref 5–15)
BUN: 16 mg/dL (ref 8–23)
CO2: 24 mmol/L (ref 22–32)
Calcium: 8.7 mg/dL — ABNORMAL LOW (ref 8.9–10.3)
Chloride: 103 mmol/L (ref 98–111)
Creatinine, Ser: 0.75 mg/dL (ref 0.44–1.00)
GFR calc Af Amer: >60 mL/min (ref >60)
GFR calc non Af Amer: >60 mL/min (ref >60)
Glucose, Bld: 139 mg/dL — ABNORMAL HIGH (ref 70–99)
Potassium: 3.7 mmol/L (ref 3.5–5.1)
Sodium: 136 mmol/L (ref 135–145)

## 2019-12-27 LAB — TROPONIN I (HIGH SENSITIVITY)
Troponin I (High Sensitivity): 4 ng/L (ref <18)
Troponin I (High Sensitivity): 5 ng/L (ref <18)

## 2019-12-27 LAB — SEDIMENTATION RATE: Sed Rate: 40 mm/hr — ABNORMAL HIGH (ref 0–22)

## 2019-12-27 MED ORDER — HYDROCODONE-ACETAMINOPHEN 5-325 MG PO TABS: 1.0000 | ORAL_TABLET | Freq: Once | ORAL | Status: DC

## 2019-12-27 MED ORDER — METHOCARBAMOL 500 MG PO TABS
500.0000 mg | ORAL_TABLET | Freq: Three times a day (TID) | ORAL | 0 refills | Status: DC | PRN
Start: 2019-12-27 — End: 2021-02-12

## 2019-12-27 MED ORDER — DIAZEPAM 2 MG PO TABS
2.0000 mg | ORAL_TABLET | Freq: Once | ORAL | Status: AC
  Administered 2019-12-27: 2 mg via ORAL
  Filled 2019-12-27: qty 1

## 2019-12-27 MED ORDER — IOHEXOL 350 MG/ML SOLN
100.0000 mL | Freq: Once | INTRAVENOUS | Status: AC | PRN
  Administered 2019-12-27: 100 mL via INTRAVENOUS

## 2019-12-27 NOTE — Discharge Instructions (Signed)
Your testing is reassuring.  There is no evidence of stroke or heart attack.  Take the muscle relaxers as prescribed and follow-up with your doctor.  Return to the ED with worsening symptoms including fever, weakness, numbness, difficulty speaking, difficulty swallowing or other concerns.

## 2019-12-27 NOTE — ED Notes (Signed)
Pt states her pain has "improved some", and that she has been able to rest and fall asleep.

## 2019-12-27 NOTE — ED Provider Notes (Signed)
[facility] DEPART[facility] Provider Note   CSN: [id number] Arrival date & time: 12/26/19  2209     History Chief Complaint  Patient presents with  . Neck Pain    Denise Gonzales is a 74 y.o. female.  Patient with history of hypertension, degenerative joint disease, hyperlipidemia presenting with a 3-week history of posterior neck pain.  Reports the pain is constant and gradually worsening.  Starts on the left side and radiates across her whole neck.  Is worse with movement of her neck and bending over.  She saw her PCP several weeks ago and was given a course of prednisone which she completed with partial relief.  She is uncertain what the diagnosis was.  She denies any falls or trauma.  She comes in tonight because the pain is worsening especially when she bends over.  She denies any focal weakness, numbness or tingling.  No chest pain or shortness of breath.  No cough or fever.  No headache.  No visual change.  No trauma to her neck.  States her blood pressure has been elevated at home which she believes is due to pain.  Has been taking her blood pressure medication as prescribed.  The history is provided by the patient.       Past Medical History:  Diagnosis Date  . Asthma   . DJD (degenerative joint disease)   . GERD (gastroesophageal reflux disease)   . History of PSVT (paroxysmal supraventricular tachycardia)   . Hyperlipidemia   . Hypertension   . Osteopenia     Patient Active Problem List   Diagnosis Date Noted  . Allergic rhinitis 11/10/2019  . Asthma, chronic 09/28/2013  . HBP (high blood pressure) 09/28/2013    Past Surgical History:  Procedure Laterality Date  . [place] and 2004  . BREAST EXCISIONAL BIOPSY    . BREAST LUMPECTOMY    . CERVICAL POLYPECTOMY       OB History    Gravida  3   Para  3   Term      Preterm      AB      Living  3     SAB      TAB      Ectopic      Multiple      Live Births                Family History  Problem Relation Age of Onset  . Asthma Sister   . Allergies Sister   . Breast cancer Sister   . Colon cancer Other        neice at 74 years old  . Hypertension Other        multiple family members  . Kidney failure Father     Social History   Tobacco Use  . Smoking status: Never Smoker  . Smokeless tobacco: Never Used  Vaping Use  . Vaping Use: Never used  Substance Use Topics  . Alcohol use: No  . Drug use: No    Home Medications Prior to Admission medications   Medication Sig Start Date End Date Taking? Authorizing Provider  atorvastatin (LIPITOR) 20 MG tablet Take 1 tablet by mouth daily. 09/02/13   [provider]  b complex vitamins tablet Take 1 tablet by mouth daily.    [provider]  butalbital-acetaminophen-caffeine (FIORICET, ESGIC) 50-325-40 MG per tablet Take 1 tablet by mouth 2 (two) times daily as needed for headache.  [provider]  diltiazem (CARDIZEM CD) 240 MG 24 hr capsule Take 240 mg by mouth daily.    [provider]  fluticasone Asencion Islam) 50 MCG/ACT nasal spray  11/21/16   [provider]  Fluticasone-Salmeterol (ADVAIR) 100-50 MCG/DOSE AEPB INHALE ONE PUFF BY MOUTH TWICE A DAY 09/01/19   Rigoberto Noel, MD  hydrochlorothiazide (HYDRODIURIL) 12.5 MG tablet Take 12.5 mg by mouth daily.    [provider]  lisinopril (PRINIVIL,ZESTRIL) 40 MG tablet Take 40 mg by mouth daily.    [provider]  loratadine (CLARITIN) 10 MG tablet Take 10 mg by mouth daily.    [provider]  montelukast (SINGULAIR) 10 MG tablet Take 1 tablet by mouth daily. 09/02/13   [provider]  Multiple Vitamins-Minerals (CENTRUM SILVER PO) Take 1 tablet by mouth daily.    [provider]  Multiple Vitamins-Minerals (ICAPS) CAPS Take 1 capsule by mouth daily.    [provider]  Omega-3 Fatty Acids (FISH OIL PO) Take 1 tablet by mouth daily.    [provider]  OVER THE COUNTER MEDICATION Osteospam (supplement for bone health) 2 capsules BID    [provider]  PROAIR HFA 108 (90 BASE) MCG/ACT inhaler Inhale 2 puffs into the lungs every 4 (four) hours as needed. 09/02/13   [provider]    Allergies    Bactrim [sulfamethoxazole-trimethoprim]  Review of Systems   Review of Systems  Constitutional: Negative for activity change, appetite change and fever.  HENT: Negative for congestion, nosebleeds and postnasal drip.   Respiratory: Negative for cough and shortness of breath.   Cardiovascular: Negative for chest pain.  Gastrointestinal: Negative for abdominal pain, nausea and vomiting.  Genitourinary: Negative for dysuria and hematuria.  Musculoskeletal: Positive for arthralgias, myalgias and neck pain.  Skin: Negative for rash.  Neurological: Negative for dizziness, weakness and headaches.   all other systems are negative except as noted in the HPI and PMH.    Physical Exam Updated Vital Signs BP (!) 205/83 (BP Location: Right Arm)   Pulse 83   Temp 98.7 F (37.1 C) (Oral)   Resp 18   Ht 5' 1"  (1.549 m)   Wt 52.9 kg   SpO2 99%   BMI 22.04 kg/m   Physical Exam Vitals and nursing note reviewed.  Constitutional:      General: She is not in acute distress.    Appearance: She is well-developed.  HENT:     Head: Normocephalic and atraumatic.     Mouth/Throat:     Pharynx: No oropharyngeal exudate.  Eyes:     Conjunctiva/sclera: Conjunctivae normal.     Pupils: Pupils are equal, round, and reactive to light.  Neck:     Comments: No meningismus. Paraspinal cervical tenderness, no midline tenderness, pain with range of motion of the neck with limited movement. Cardiovascular:     Rate and Rhythm: Normal rate and regular rhythm.     Heart sounds: Normal heart sounds. No murmur heard.   Pulmonary:     Effort: Pulmonary effort is normal. No respiratory distress.     Breath sounds: Normal breath  sounds.  Abdominal:     Palpations: Abdomen is soft.     Tenderness: There is no abdominal tenderness. There is no guarding or rebound.  Musculoskeletal:        General: No tenderness. Normal range of motion.     Cervical back: Normal range of motion and neck supple.  Skin:  General: Skin is warm.  Neurological:     Mental Status: She is alert and oriented to person, place, and time.     Cranial Nerves: No cranial nerve deficit.     Motor: No abnormal muscle tone.     Coordination: Coordination normal.     Comments: CN 2-12 intact, no ataxia on finger to nose, no nystagmus, 5/5 strength throughout, no pronator drift, Romberg negative, normal gait.   Psychiatric:        Behavior: Behavior normal.     ED Results / Procedures / Treatments   Labs (all labs ordered are listed, but only abnormal results are displayed) Labs Reviewed  CBC WITH DIFFERENTIAL/PLATELET - Abnormal; Notable for the following components:      Result Value   Hemoglobin 11.9 (*)    Neutro Abs 8.3 (*)    All other components within normal limits  BASIC METABOLIC PANEL - Abnormal; Notable for the following components:   Glucose, Bld 139 (*)    Calcium 8.7 (*)    All other components within normal limits  SEDIMENTATION RATE - Abnormal; Notable for the following components:   Sed Rate 40 (*)    All other components within normal limits  TROPONIN I (HIGH SENSITIVITY)  TROPONIN I (HIGH SENSITIVITY)    EKG EKG Interpretation  Date/Time:  Monday December 27 2019 01:11:36 EDT Ventricular Rate:  78 PR Interval:    QRS Duration: 93 QT Interval:  380 QTC Calculation: 433 R Axis:   13 Text Interpretation: Sinus rhythm Probable left atrial enlargement Low voltage, precordial leads Baseline wander in lead(s) V5 No significant change was found Confirmed by Ezequiel Essex [phone]) on 12/27/2019 1:52:04 AM   Radiology CT Angio Head W or Wo Contrast  Result Date: 12/27/2019 CLINICAL DATA:  Neck pain EXAM: CT  ANGIOGRAPHY HEAD AND NECK TECHNIQUE: Multidetector CT imaging of the head and neck was performed using the standard protocol during bolus administration of intravenous contrast. Multiplanar CT image reconstructions and MIPs were obtained to evaluate the vascular anatomy. Carotid stenosis measurements (when applicable) are obtained utilizing NASCET criteria, using the distal internal carotid diameter as the denominator. CONTRAST:  141m OMNIPAQUE IOHEXOL 350 MG/ML SOLN COMPARISON:  None. FINDINGS: CT HEAD FINDINGS Brain: There is no mass, hemorrhage or extra-axial collection. The size and configuration of the ventricles and extra-axial CSF spaces are normal. There is no acute or chronic infarction. The brain parenchyma is normal. Skull: The visualized skull base, calvarium and extracranial soft tissues are normal. Sinuses/Orbits: No fluid levels or advanced mucosal thickening of the visualized paranasal sinuses. No mastoid or middle ear effusion. The orbits are normal. CTA NECK FINDINGS SKELETON: There is no bony spinal canal stenosis. No lytic or blastic lesion. OTHER NECK: Normal pharynx, larynx and major salivary glands. No cervical lymphadenopathy. Unremarkable thyroid gland. UPPER CHEST: No pneumothorax or pleural effusion. No nodules or masses. AORTIC ARCH: There is no calcific atherosclerosis of the aortic arch. There is no aneurysm, dissection or hemodynamically significant stenosis of the visualized portion of the aorta. Conventional 3 vessel aortic branching pattern. The visualized proximal subclavian arteries are widely patent. RIGHT CAROTID SYSTEM: No dissection, occlusion or aneurysm. Mild atherosclerotic calcification at the carotid bifurcation without hemodynamically significant stenosis. LEFT CAROTID SYSTEM: No dissection, occlusion or aneurysm. Mild atherosclerotic calcification at the carotid bifurcation without hemodynamically significant stenosis. VERTEBRAL ARTERIES: Left dominant configuration.  Both origins are clearly patent. There is no dissection, occlusion or flow-limiting stenosis to the skull base (V1-V3 segments). CTA HEAD  FINDINGS POSTERIOR CIRCULATION: --Vertebral arteries: Normal V4 segments. --Inferior cerebellar arteries: Normal. --Basilar artery: Normal. --Superior cerebellar arteries: Normal. --Posterior cerebral arteries (PCA): Normal. ANTERIOR CIRCULATION: --Intracranial internal carotid arteries: Normal. --Anterior cerebral arteries (ACA): Normal. Both A1 segments are present. Patent anterior communicating artery (a-comm). --Middle cerebral arteries (MCA): Normal. VENOUS SINUSES: As permitted by contrast timing, patent. ANATOMIC VARIANTS: None Review of the MIP images confirms the above findings. IMPRESSION: Normal CTA of the head and neck. Electronically Signed   By: Ulyses Jarred M.D.   On: 12/27/2019 02:51   CT Angio Neck W and/or Wo Contrast  Result Date: 12/27/2019 CLINICAL DATA:  Neck pain EXAM: CT ANGIOGRAPHY HEAD AND NECK TECHNIQUE: Multidetector CT imaging of the head and neck was performed using the standard protocol during bolus administration of intravenous contrast. Multiplanar CT image reconstructions and MIPs were obtained to evaluate the vascular anatomy. Carotid stenosis measurements (when applicable) are obtained utilizing NASCET criteria, using the distal internal carotid diameter as the denominator. CONTRAST:  111m OMNIPAQUE IOHEXOL 350 MG/ML SOLN COMPARISON:  None. FINDINGS: CT HEAD FINDINGS Brain: There is no mass, hemorrhage or extra-axial collection. The size and configuration of the ventricles and extra-axial CSF spaces are normal. There is no acute or chronic infarction. The brain parenchyma is normal. Skull: The visualized skull base, calvarium and extracranial soft tissues are normal. Sinuses/Orbits: No fluid levels or advanced mucosal thickening of the visualized paranasal sinuses. No mastoid or middle ear effusion. The orbits are normal. CTA NECK FINDINGS  SKELETON: There is no bony spinal canal stenosis. No lytic or blastic lesion. OTHER NECK: Normal pharynx, larynx and major salivary glands. No cervical lymphadenopathy. Unremarkable thyroid gland. UPPER CHEST: No pneumothorax or pleural effusion. No nodules or masses. AORTIC ARCH: There is no calcific atherosclerosis of the aortic arch. There is no aneurysm, dissection or hemodynamically significant stenosis of the visualized portion of the aorta. Conventional 3 vessel aortic branching pattern. The visualized proximal subclavian arteries are widely patent. RIGHT CAROTID SYSTEM: No dissection, occlusion or aneurysm. Mild atherosclerotic calcification at the carotid bifurcation without hemodynamically significant stenosis. LEFT CAROTID SYSTEM: No dissection, occlusion or aneurysm. Mild atherosclerotic calcification at the carotid bifurcation without hemodynamically significant stenosis. VERTEBRAL ARTERIES: Left dominant configuration. Both origins are clearly patent. There is no dissection, occlusion or flow-limiting stenosis to the skull base (V1-V3 segments). CTA HEAD FINDINGS POSTERIOR CIRCULATION: --Vertebral arteries: Normal V4 segments. --Inferior cerebellar arteries: Normal. --Basilar artery: Normal. --Superior cerebellar arteries: Normal. --Posterior cerebral arteries (PCA): Normal. ANTERIOR CIRCULATION: --Intracranial internal carotid arteries: Normal. --Anterior cerebral arteries (ACA): Normal. Both A1 segments are present. Patent anterior communicating artery (a-comm). --Middle cerebral arteries (MCA): Normal. VENOUS SINUSES: As permitted by contrast timing, patent. ANATOMIC VARIANTS: None Review of the MIP images confirms the above findings. IMPRESSION: Normal CTA of the head and neck. Electronically Signed   By: KUlyses JarredM.D.   On: 12/27/2019 02:51    Procedures Procedures (including critical care time)  Medications Ordered in ED Medications - No data to display  ED Course  I have reviewed  the triage vital signs and the nursing notes.  Pertinent labs & imaging results that were available during my care of the patient were reviewed by me and considered in my medical decision making (see chart for details).    MDM Rules/Calculators/A&P                         3 weeks of progressively worsening neck pain.  No trauma.  Neurovascular intact with equal strength, radial pulses and sensation.  No headache or visual change.  EKG is sinus rhythm.  Low suspicion for ACS. ESR minimally elevated.  Low suspicion for subarachnoid hemorrhage, meningitis, temporal arteritis.  Patient's neck pain is reproducible and her range of motion is very limited.  Given her hypertension and ongoing pain will obtain vascular imaging.  CTA is negative for aneurysm or vertebral or carotid artery dissection. Patient's pain has improved somewhat after Valium.  She is resting comfortably.  Blood pressure has improved.  Troponin negative.  Low suspicion for ACS. Will treat with anti-inflammatories and muscle relaxers.  Take your blood pressure medications as prescribed.  Follow-up with your primary physician.  Return precautions discussed.   Final Clinical Impression(s) / ED Diagnoses Final diagnoses:  Torticollis, acute  Essential hypertension    Rx / DC Orders ED Discharge Orders    None       Ozan Maclay, Annie Main, MD 12/27/19 [phone]

## 2019-12-29 DIAGNOSIS — I779 Disorder of arteries and arterioles, unspecified: Secondary | ICD-10-CM | POA: Diagnosis not present

## 2019-12-29 DIAGNOSIS — M5030 Other cervical disc degeneration, unspecified cervical region: Secondary | ICD-10-CM | POA: Diagnosis not present

## 2019-12-29 DIAGNOSIS — M436 Torticollis: Secondary | ICD-10-CM | POA: Diagnosis not present

## 2019-12-30 ENCOUNTER — Other Ambulatory Visit: Payer: Self-pay | Admitting: Pulmonary Disease

## 2020-02-24 DIAGNOSIS — H40033 Anatomical narrow angle, bilateral: Secondary | ICD-10-CM | POA: Diagnosis not present

## 2020-02-24 DIAGNOSIS — H40013 Open angle with borderline findings, low risk, bilateral: Secondary | ICD-10-CM | POA: Diagnosis not present

## 2020-03-24 DIAGNOSIS — S41159A Open bite of unspecified upper arm, initial encounter: Secondary | ICD-10-CM | POA: Diagnosis not present

## 2020-03-24 DIAGNOSIS — M79602 Pain in left arm: Secondary | ICD-10-CM | POA: Diagnosis not present

## 2020-03-24 DIAGNOSIS — B078 Other viral warts: Secondary | ICD-10-CM | POA: Diagnosis not present

## 2020-03-24 DIAGNOSIS — W540XXA Bitten by dog, initial encounter: Secondary | ICD-10-CM | POA: Diagnosis not present

## 2020-04-25 DIAGNOSIS — Z23 Encounter for immunization: Secondary | ICD-10-CM | POA: Diagnosis not present

## 2020-04-25 DIAGNOSIS — M25512 Pain in left shoulder: Secondary | ICD-10-CM | POA: Diagnosis not present

## 2020-04-25 DIAGNOSIS — M810 Age-related osteoporosis without current pathological fracture: Secondary | ICD-10-CM | POA: Diagnosis not present

## 2020-06-30 DIAGNOSIS — W540XXA Bitten by dog, initial encounter: Secondary | ICD-10-CM | POA: Diagnosis not present

## 2020-06-30 DIAGNOSIS — S41159A Open bite of unspecified upper arm, initial encounter: Secondary | ICD-10-CM | POA: Diagnosis not present

## 2020-06-30 DIAGNOSIS — J301 Allergic rhinitis due to pollen: Secondary | ICD-10-CM | POA: Diagnosis not present

## 2020-08-30 ENCOUNTER — Other Ambulatory Visit (HOSPITAL_BASED_OUTPATIENT_CLINIC_OR_DEPARTMENT_OTHER): Payer: Self-pay | Admitting: Obstetrics & Gynecology

## 2020-08-30 ENCOUNTER — Other Ambulatory Visit (HOSPITAL_BASED_OUTPATIENT_CLINIC_OR_DEPARTMENT_OTHER): Payer: Self-pay | Admitting: Family Medicine

## 2020-08-30 DIAGNOSIS — Z1231 Encounter for screening mammogram for malignant neoplasm of breast: Secondary | ICD-10-CM

## 2020-09-05 ENCOUNTER — Other Ambulatory Visit: Payer: Self-pay

## 2020-09-05 ENCOUNTER — Ambulatory Visit (HOSPITAL_BASED_OUTPATIENT_CLINIC_OR_DEPARTMENT_OTHER)
Admission: RE | Admit: 2020-09-05 | Discharge: 2020-09-05 | Disposition: A | Discharge status: Home / Self Care | Payer: PPO | Source: Ambulatory Visit | Attending: Obstetrics & Gynecology | Admitting: Obstetrics & Gynecology

## 2020-09-05 DIAGNOSIS — Z1231 Encounter for screening mammogram for malignant neoplasm of breast: Secondary | ICD-10-CM | POA: Insufficient documentation

## 2020-09-11 ENCOUNTER — Other Ambulatory Visit: Payer: Self-pay | Admitting: Obstetrics & Gynecology

## 2020-09-11 DIAGNOSIS — R928 Other abnormal and inconclusive findings on diagnostic imaging of breast: Secondary | ICD-10-CM

## 2020-09-27 ENCOUNTER — Other Ambulatory Visit: Payer: Self-pay

## 2020-09-27 ENCOUNTER — Ambulatory Visit
Admission: RE | Admit: 2020-09-27 | Discharge: 2020-09-27 | Disposition: A | Discharge status: Home / Self Care | Payer: PPO | Source: Ambulatory Visit | Attending: Obstetrics & Gynecology | Admitting: Obstetrics & Gynecology

## 2020-09-27 DIAGNOSIS — R928 Other abnormal and inconclusive findings on diagnostic imaging of breast: Secondary | ICD-10-CM | POA: Diagnosis not present

## 2020-09-27 DIAGNOSIS — N6489 Other specified disorders of breast: Secondary | ICD-10-CM | POA: Diagnosis not present

## 2020-10-11 DIAGNOSIS — H25013 Cortical age-related cataract, bilateral: Secondary | ICD-10-CM | POA: Diagnosis not present

## 2020-10-11 DIAGNOSIS — H40013 Open angle with borderline findings, low risk, bilateral: Secondary | ICD-10-CM | POA: Diagnosis not present

## 2020-10-11 DIAGNOSIS — H524 Presbyopia: Secondary | ICD-10-CM | POA: Diagnosis not present

## 2020-10-11 DIAGNOSIS — H353132 Nonexudative age-related macular degeneration, bilateral, intermediate dry stage: Secondary | ICD-10-CM | POA: Diagnosis not present

## 2020-10-11 DIAGNOSIS — H2513 Age-related nuclear cataract, bilateral: Secondary | ICD-10-CM | POA: Diagnosis not present

## 2020-10-31 ENCOUNTER — Encounter: Payer: PPO | Admitting: Obstetrics & Gynecology

## 2020-10-31 DIAGNOSIS — I471 Supraventricular tachycardia: Secondary | ICD-10-CM | POA: Diagnosis not present

## 2020-10-31 DIAGNOSIS — I10 Essential (primary) hypertension: Secondary | ICD-10-CM | POA: Diagnosis not present

## 2020-10-31 DIAGNOSIS — E782 Mixed hyperlipidemia: Secondary | ICD-10-CM | POA: Diagnosis not present

## 2020-10-31 DIAGNOSIS — R739 Hyperglycemia, unspecified: Secondary | ICD-10-CM | POA: Diagnosis not present

## 2020-10-31 DIAGNOSIS — M129 Arthropathy, unspecified: Secondary | ICD-10-CM | POA: Diagnosis not present

## 2020-10-31 DIAGNOSIS — I739 Peripheral vascular disease, unspecified: Secondary | ICD-10-CM | POA: Diagnosis not present

## 2020-11-24 DIAGNOSIS — J302 Other seasonal allergic rhinitis: Secondary | ICD-10-CM | POA: Diagnosis not present

## 2020-11-24 DIAGNOSIS — I10 Essential (primary) hypertension: Secondary | ICD-10-CM | POA: Diagnosis not present

## 2020-11-24 DIAGNOSIS — E782 Mixed hyperlipidemia: Secondary | ICD-10-CM | POA: Diagnosis not present

## 2020-11-24 DIAGNOSIS — I779 Disorder of arteries and arterioles, unspecified: Secondary | ICD-10-CM | POA: Diagnosis not present

## 2020-11-24 DIAGNOSIS — R739 Hyperglycemia, unspecified: Secondary | ICD-10-CM | POA: Diagnosis not present

## 2020-11-24 DIAGNOSIS — J019 Acute sinusitis, unspecified: Secondary | ICD-10-CM | POA: Diagnosis not present

## 2020-11-27 DIAGNOSIS — I739 Peripheral vascular disease, unspecified: Secondary | ICD-10-CM | POA: Diagnosis not present

## 2020-11-27 DIAGNOSIS — I10 Essential (primary) hypertension: Secondary | ICD-10-CM | POA: Diagnosis not present

## 2020-11-27 DIAGNOSIS — R011 Cardiac murmur, unspecified: Secondary | ICD-10-CM | POA: Diagnosis not present

## 2020-11-27 DIAGNOSIS — G459 Transient cerebral ischemic attack, unspecified: Secondary | ICD-10-CM | POA: Diagnosis not present

## 2020-11-27 DIAGNOSIS — R9431 Abnormal electrocardiogram [ECG] [EKG]: Secondary | ICD-10-CM | POA: Diagnosis not present

## 2020-11-27 DIAGNOSIS — I779 Disorder of arteries and arterioles, unspecified: Secondary | ICD-10-CM | POA: Diagnosis not present

## 2020-12-10 DIAGNOSIS — J209 Acute bronchitis, unspecified: Secondary | ICD-10-CM | POA: Diagnosis not present

## 2020-12-10 DIAGNOSIS — R059 Cough, unspecified: Secondary | ICD-10-CM | POA: Diagnosis not present

## 2020-12-22 ENCOUNTER — Other Ambulatory Visit: Payer: Self-pay | Admitting: Pulmonary Disease

## 2020-12-25 ENCOUNTER — Other Ambulatory Visit: Payer: Self-pay

## 2020-12-25 NOTE — Progress Notes (Signed)
Erroneous encounter

## 2020-12-26 ENCOUNTER — Other Ambulatory Visit: Payer: Self-pay | Admitting: Pulmonary Disease

## 2020-12-26 ENCOUNTER — Other Ambulatory Visit: Payer: Self-pay

## 2020-12-28 DIAGNOSIS — Z0000 Encounter for general adult medical examination without abnormal findings: Secondary | ICD-10-CM | POA: Diagnosis not present

## 2020-12-28 DIAGNOSIS — I739 Peripheral vascular disease, unspecified: Secondary | ICD-10-CM | POA: Diagnosis not present

## 2020-12-28 DIAGNOSIS — I10 Essential (primary) hypertension: Secondary | ICD-10-CM | POA: Diagnosis not present

## 2020-12-28 DIAGNOSIS — R9431 Abnormal electrocardiogram [ECG] [EKG]: Secondary | ICD-10-CM | POA: Diagnosis not present

## 2020-12-28 DIAGNOSIS — I779 Disorder of arteries and arterioles, unspecified: Secondary | ICD-10-CM | POA: Diagnosis not present

## 2021-01-11 DIAGNOSIS — M5430 Sciatica, unspecified side: Secondary | ICD-10-CM | POA: Diagnosis not present

## 2021-01-11 DIAGNOSIS — M62830 Muscle spasm of back: Secondary | ICD-10-CM | POA: Diagnosis not present

## 2021-01-16 DIAGNOSIS — M62830 Muscle spasm of back: Secondary | ICD-10-CM | POA: Diagnosis not present

## 2021-01-16 DIAGNOSIS — Z9889 Other specified postprocedural states: Secondary | ICD-10-CM | POA: Diagnosis not present

## 2021-01-16 DIAGNOSIS — M5416 Radiculopathy, lumbar region: Secondary | ICD-10-CM | POA: Diagnosis not present

## 2021-01-16 DIAGNOSIS — M5430 Sciatica, unspecified side: Secondary | ICD-10-CM | POA: Diagnosis not present

## 2021-01-19 DIAGNOSIS — M5416 Radiculopathy, lumbar region: Secondary | ICD-10-CM | POA: Diagnosis not present

## 2021-01-19 DIAGNOSIS — M545 Low back pain, unspecified: Secondary | ICD-10-CM | POA: Diagnosis not present

## 2021-01-19 DIAGNOSIS — M5136 Other intervertebral disc degeneration, lumbar region: Secondary | ICD-10-CM | POA: Diagnosis not present

## 2021-01-26 DIAGNOSIS — M62830 Muscle spasm of back: Secondary | ICD-10-CM | POA: Diagnosis not present

## 2021-01-26 DIAGNOSIS — Z9889 Other specified postprocedural states: Secondary | ICD-10-CM | POA: Diagnosis not present

## 2021-01-26 DIAGNOSIS — M5430 Sciatica, unspecified side: Secondary | ICD-10-CM | POA: Diagnosis not present

## 2021-01-26 DIAGNOSIS — M5416 Radiculopathy, lumbar region: Secondary | ICD-10-CM | POA: Diagnosis not present

## 2021-02-02 ENCOUNTER — Other Ambulatory Visit: Payer: Self-pay | Admitting: Sports Medicine

## 2021-02-02 ENCOUNTER — Telehealth: Payer: Self-pay | Admitting: Pulmonary Disease

## 2021-02-02 DIAGNOSIS — R52 Pain, unspecified: Secondary | ICD-10-CM

## 2021-02-02 DIAGNOSIS — M545 Low back pain, unspecified: Secondary | ICD-10-CM | POA: Diagnosis not present

## 2021-02-02 MED ORDER — FLUTICASONE-SALMETEROL 100-50 MCG/ACT IN AEPB: INHALATION_SPRAY | RESPIRATORY_TRACT | 0 refills | Status: DC

## 2021-02-02 NOTE — Telephone Encounter (Signed)
I called and spoke with patient with regarding refill on Advair. Looks like the patient has not been seen in the office since 12/07/19 so went ahead and set up appt with Judson Roch groce on 02/12/21 and I sent in 30 day refill and informed patient they need to show up for appt to get more. Patient verbalized understanding, nothing further needed.

## 2021-02-07 ENCOUNTER — Other Ambulatory Visit: Payer: PPO

## 2021-02-09 ENCOUNTER — Ambulatory Visit
Admission: RE | Admit: 2021-02-09 | Discharge: 2021-02-09 | Disposition: A | Discharge status: Home / Self Care | Payer: PPO | Source: Ambulatory Visit | Attending: Sports Medicine | Admitting: Sports Medicine

## 2021-02-09 ENCOUNTER — Other Ambulatory Visit: Payer: Self-pay

## 2021-02-09 DIAGNOSIS — M545 Low back pain, unspecified: Secondary | ICD-10-CM | POA: Diagnosis not present

## 2021-02-09 DIAGNOSIS — R52 Pain, unspecified: Secondary | ICD-10-CM

## 2021-02-09 DIAGNOSIS — M48061 Spinal stenosis, lumbar region without neurogenic claudication: Secondary | ICD-10-CM | POA: Diagnosis not present

## 2021-02-12 ENCOUNTER — Encounter: Payer: Self-pay | Admitting: Acute Care

## 2021-02-12 ENCOUNTER — Ambulatory Visit: Payer: PPO | Admitting: Acute Care

## 2021-02-12 ENCOUNTER — Other Ambulatory Visit: Payer: Self-pay

## 2021-02-12 VITALS — BP 126/72 | HR 108 | Temp 97.3°F | Ht 63.0 in | Wt 109.4 lb

## 2021-02-12 DIAGNOSIS — J309 Allergic rhinitis, unspecified: Secondary | ICD-10-CM

## 2021-02-12 DIAGNOSIS — J45909 Unspecified asthma, uncomplicated: Secondary | ICD-10-CM | POA: Diagnosis not present

## 2021-02-12 MED ORDER — FLUTICASONE-SALMETEROL 100-50 MCG/ACT IN AEPB: 1.0000 | INHALATION_SPRAY | Freq: Two times a day (BID) | RESPIRATORY_TRACT | 11 refills | Status: DC

## 2021-02-12 MED ORDER — ALBUTEROL SULFATE HFA 108 (90 BASE) MCG/ACT IN AERS: 2.0000 | INHALATION_SPRAY | Freq: Four times a day (QID) | RESPIRATORY_TRACT | 2 refills | Status: DC | PRN

## 2021-02-12 NOTE — Progress Notes (Signed)
History of Present Illness Denise Gonzales is a 75 y.o. female never smoker with asthma, and allergic rhinitis  . She is followed by Dr. Elsworth Soho. Maintenance > Advair, Singulair Rescue ProAir [facility]   02/12/2021 Pt. Presents for annual follow up. She called the office 7/22 needing a refill for her Advair. She had not been seen in the office since 11/2019, so an appointment was made for today for an annual check. She was provided with one 30 day refill of medication. We will renew today. She states she has been doing well. She states she has been using her Advair daily and Singulair daily.She denies any wheezing or issues. . She has had no issues with her asthma. She needs both her Advair and her Albuterol RX. Refilled and ordered. She states she does not need the Singulair refilled. She does have some hip pain from arthritis. She is seeing orthopedics for this.   Test Results: 02/2014 Spirometry again showed moderate airway obstruction with FEV1 of 1.39-64% 01/2015 FEV1 63%   11/2016 FEV1 64%, ratio 70   12/2017 FEV1 64%    CBC Latest Ref Rng & Units 12/27/2019 04/23/2017  WBC 4.0 - 10.5 K/uL 10.2 6.7  Hemoglobin 12.0 - 15.0 g/dL 11.9(L) 12.5  Hematocrit 36.0 - 46.0 % 36.5 36.9  Platelets 150 - 400 K/uL 282 286    BMP Latest Ref Rng & Units 12/27/2019 04/23/2017  Glucose 70 - 99 mg/dL 139(H) 101(H)  BUN 8 - 23 mg/dL 16 15  Creatinine 0.44 - 1.00 mg/dL 0.75 0.98  Sodium 135 - 145 mmol/L 136 138  Potassium 3.5 - 5.1 mmol/L 3.7 3.9  Chloride 98 - 111 mmol/L 103 105  CO2 22 - 32 mmol/L 24 27  Calcium 8.9 - 10.3 mg/dL 8.7(L) 9.2    BNP No results found for: BNP  ProBNP No results found for: PROBNP  PFT No results found for: FEV1PRE, FEV1POST, FVCPRE, FVCPOST, TLC, DLCOUNC, PREFEV1FVCRT, PSTFEV1FVCRT  MR LUMBAR SPINE WO CONTRAST  Result Date: 02/09/2021 CLINICAL DATA:  Pain R52 (ICD-10-CM).  Radiating to left leg. EXAM: MRI LUMBAR SPINE WITHOUT CONTRAST TECHNIQUE: Multiplanar, multisequence  MR imaging of the lumbar spine was performed. No intravenous contrast was administered. COMPARISON:  None. FINDINGS: Segmentation: Standard segmentation is assumed. The inferior-most fully formed intervertebral disc is labeled L5-S1. Alignment: Dextrocurvature. Straightening. No substantial sagittal subluxation. Vertebrae: Marked discogenic/degenerative marrow edema surrounding the L3-L4 disc with severe disc height loss. No significant disc edema. No specific evidence of acute fracture or discitis/osteomyelitis. Heterogeneous marrow without suspicious bone lesion. Inferior L1 endplate Schmorl's node. Conus medullaris and cauda equina: Conus extends to the inferior L1 level. Conus appears normal. Paraspinal and other soft tissues: Left renal and hepatic cysts. Disc levels: T10-T11 and T11-T12: Only imaged sagittally. Small disc bulges without evidence of significant canal or foraminal stenosis. T12-L1: Mild disc bulging and mild left facet hypertrophy. Mild left foraminal stenosis. No significant canal or right foraminal stenosis. L1-L2: Mildly left eccentric disc bulge. Mild left facet hypertrophy. Mild left foraminal and left subarticular recess stenosis. No significant canal or right foraminal stenosis. L2-L3: Broad disc bulge with mild bilateral facet hypertrophy and ligamentum flavum thickening. Mild canal and bilateral subarticular recess stenosis and mild left foraminal stenosis. L3-L4: Severe disc height loss with marked discogenic endplate signal changes. Posterior disc bulge and endplate spurring with superimposed left subarticular/foraminal disc protrusion. Resulting severe canal and left subarticular recess stenosis and severe left foraminal stenosis. Mild right foraminal stenosis. L4-L5: Left eccentric disc  with endplate spurring. Suspected prior left laminectomy. Right-sided ligamentum flavum thickening. Moderate right subarticular recess stenosis without significant central canal stenosis. Mild to  moderate bilateral foraminal stenosis. L5-S1: Posterior disc bulge with superimposed left foraminal disc protrusion. Mild bilateral facet hypertrophy. Moderate bilateral foraminal stenosis. No significant canal stenosis. IMPRESSION: 1. At L3-L4, severe canal and left subarticular recess stenosis with severe left foraminal stenosis. Severe degenerative disc disease at this level with marked discogenic edema about the disc. 2. At L5-S1, moderate bilateral foraminal stenosis. 3. At L4-L5, suspected prior left laminectomy with moderate right subarticular recess stenosis and mild to moderate bilateral foraminal stenosis. 4. At L2-L3, mild canal and left foraminal stenosis. 5. At T12-L1 and L1-L2, mild left foraminal stenosis. Electronically Signed   By: Margaretha Sheffield MD   On: 02/09/2021 19:25     Past medical hx Past Medical History:  Diagnosis Date   Asthma    DJD (degenerative joint disease)    GERD (gastroesophageal reflux disease)    History of PSVT (paroxysmal supraventricular tachycardia)    Hyperlipidemia    Hypertension    Osteopenia      Social History   Tobacco Use   Smoking status: Never   Smokeless tobacco: Never  Vaping Use   Vaping Use: Never used  Substance Use Topics   Alcohol use: No   Drug use: No    Ms.Pilcher reports that she has never smoked. She has never used smokeless tobacco. She reports that she does not drink alcohol and does not use drugs.  Tobacco Cessation: Never smoker   Past surgical hx, Family hx, Social hx all reviewed.  Current Outpatient Medications on File Prior to Visit  Medication Sig   atorvastatin (LIPITOR) 20 MG tablet Take 1 tablet by mouth daily.   b complex vitamins tablet Take 1 tablet by mouth daily.   diltiazem (CARDIZEM CD) 240 MG 24 hr capsule Take 240 mg by mouth daily.   diltiazem (TIAZAC) 240 MG 24 hr capsule Take 240 mg by mouth daily.   fluticasone (FLONASE) 50 MCG/ACT nasal spray    fluticasone-salmeterol (ADVAIR) 100-50  MCG/ACT AEPB INHALE ONE PUFF BY MOUTH TWICE A DAY   hydrochlorothiazide (HYDRODIURIL) 12.5 MG tablet Take 12.5 mg by mouth daily.   lisinopril (PRINIVIL,ZESTRIL) 40 MG tablet Take 40 mg by mouth daily.   loratadine (CLARITIN) 10 MG tablet Take 10 mg by mouth daily.   montelukast (SINGULAIR) 10 MG tablet Take 1 tablet by mouth daily.   Multiple Vitamins-Minerals (CENTRUM SILVER PO) Take 1 tablet by mouth daily.   Multiple Vitamins-Minerals (ICAPS) CAPS Take 1 capsule by mouth daily.   Omega-3 Fatty Acids (FISH OIL PO) Take 1 tablet by mouth daily.   OVER THE COUNTER MEDICATION Osteospam (supplement for bone health) 2 capsules BID   PROAIR HFA 108 (90 BASE) MCG/ACT inhaler Inhale 2 puffs into the lungs every 4 (four) hours as needed.   No current facility-administered medications on file prior to visit.     Allergies  Allergen Reactions   Bactrim [Sulfamethoxazole-Trimethoprim] Other (See Comments)    Pt felt like she was having a heart attack.     Review Of Systems:  Constitutional:   No  weight loss, night sweats,  Fevers, chills, fatigue, or  lassitude.  HEENT:   No headaches,  Difficulty swallowing,  Tooth/dental problems, or  Sore throat,                No sneezing, itching, ear ache, nasal congestion, post nasal  drip,   CV:  No chest pain,  Orthopnea, PND, swelling in lower extremities, anasarca, dizziness, palpitations, syncope.   GI  No heartburn, indigestion, abdominal pain, nausea, vomiting, diarrhea, change in bowel habits, loss of appetite, bloody stools.   Resp: No shortness of breath with exertion or at rest.  No excess mucus, no productive cough,  No non-productive cough,  No coughing up of blood.  No change in color of mucus.  No wheezing.  No chest wall deformity  Skin: no rash or lesions.  GU: no dysuria, change in color of urine, no urgency or frequency.  No flank pain, no hematuria   MS:  + L hip  joint pain or swelling.  + decreased range of motion.  No back  pain.  Psych:  No change in mood or affect. No depression or anxiety.  No memory loss.   Vital Signs BP 126/72 (BP Location: Right Arm, Cuff Size: Normal)   Pulse (!) 108   Temp (!) 97.3 F (36.3 C) (Oral)   Ht '5\' 3"'$  (1.6 m)   Wt 109 lb 6.4 oz (49.6 kg)   SpO2 98%   BMI 19.38 kg/m    Physical Exam:  General- No distress,  A&Ox3, very pleasant ENT: No sinus tenderness, TM clear, pale nasal mucosa, no oral exudate,no post nasal drip, no LAN Cardiac: S1, S2, regular rate and rhythm, no murmur Chest: No wheeze/ rales/ dullness; no accessory muscle use, no nasal flaring, no sternal retractions Abd.: Soft Non-tender, ND, BS +, Body mass index is 19.38 kg/m. Ext: No clubbing cyanosis, edema, L hip pain Neuro:  normal strength, MAE x 4, A&O x 3, appropriate Skin: No rashes, warm and dry, No lesions Psych: normal mood and behavior   Assessment/Plan Chronic Asthma Stable interval Plan We will renew your Advair  Use one puff twice daily Rinse mouth after use. We will renew your Albuterol Inhaler also. Use as needed for breakthrough shortness of breath.  Follow up as needed  Annual follow up 01/2022. Please contact office for sooner follow up if symptoms do not improve or worsen or seek emergency care    Allergic Rhinitis Stable interval Plan Continue Singulair daily Symptomatic treatment   I spent 30 minutes dedicated to the care of this patient on the date of this encounter to include pre-visit review of records, face-to-face time with the patient discussing conditions above, post visit ordering of testing, clinical documentation with the electronic health record, making appropriate referrals as documented, and communicating necessary information to the patient's healthcare team.   Magdalen Spatz, NP 02/12/2021  11:10 AM

## 2021-02-12 NOTE — Patient Instructions (Addendum)
It is good to see you today. We will renew your Advair  Use one puff twice daily Rinse mouth after use. We will renew your Albuterol Inhaler also. Use as needed for breakthrough shortness of breath.  Follow up as needed  Annual follow up 01/2022. Please contact office for sooner follow up if symptoms do not improve or worsen or seek emergency care

## 2021-02-15 DIAGNOSIS — M5136 Other intervertebral disc degeneration, lumbar region: Secondary | ICD-10-CM | POA: Diagnosis not present

## 2021-02-15 DIAGNOSIS — M5416 Radiculopathy, lumbar region: Secondary | ICD-10-CM | POA: Diagnosis not present

## 2021-03-08 ENCOUNTER — Ambulatory Visit: Payer: PPO | Attending: Physical Medicine and Rehabilitation | Admitting: Physical Therapy

## 2021-03-08 ENCOUNTER — Encounter: Payer: Self-pay | Admitting: Physical Therapy

## 2021-03-08 DIAGNOSIS — M545 Low back pain, unspecified: Secondary | ICD-10-CM | POA: Insufficient documentation

## 2021-03-08 DIAGNOSIS — M5416 Radiculopathy, lumbar region: Secondary | ICD-10-CM | POA: Diagnosis not present

## 2021-03-08 DIAGNOSIS — M6281 Muscle weakness (generalized): Secondary | ICD-10-CM | POA: Insufficient documentation

## 2021-03-08 DIAGNOSIS — R29898 Other symptoms and signs involving the musculoskeletal system: Secondary | ICD-10-CM | POA: Diagnosis not present

## 2021-03-08 NOTE — Therapy (Signed)
[facility] [address]  [place], [place], [address] Phone: [phone]   Fax:  [phone]  Physical Therapy Evaluation  Patient Details  Name: Denise Gonzales MRN: [id number] Date of Birth: 07-06-1946 Referring Provider (PT): Laroy Apple, MD   Encounter Date: 03/08/2021   PT End of Session - 03/08/21 0931     Visit Number 1    Number of Visits 12    Date for PT Re-Evaluation 04/19/21    Authorization Type [employer]    PT Start Time [id number]    PT Stop Time 1022    PT Time Calculation (min) 51 min    Activity Tolerance Patient tolerated treatment well    Behavior During Therapy [facility] for tasks assessed/performed             Past Medical History:  Diagnosis Date   Asthma    DJD (degenerative joint disease)    GERD (gastroesophageal reflux disease)    History of PSVT (paroxysmal supraventricular tachycardia)    Hyperlipidemia    Hypertension    Osteopenia     Past Surgical History:  Procedure Laterality Date   BACK SURGERY  1990 and 2004   BREAST EXCISIONAL BIOPSY     BREAST LUMPECTOMY     CERVICAL POLYPECTOMY      There were no vitals filed for this visit.    Subjective Assessment - 03/08/21 0934     Subjective Pt reports they sent her to PT for arthritis in her back/hip and leg. Pt reports onset of LBP, L hip and LE pain ~1 month ago following a fall in her yard. Pain has been subsiding and becoming more intermittent. She does have remote h/o back surgery x 2 (1990 & 2004) but unable to recall specifics of the surgeries.    Limitations House hold activities    Patient Stated Goals "to almost be where I used to be before"    Currently in Pain? No/denies    Pain Score 0-No pain   up to 2/10   Pain Location Back    Pain Orientation Left;Lower;Right   L>R   Pain Descriptors / Indicators Other (Comment)   "regular pain"   Pain Type Acute pain    Pain Radiating Towards L buttocks, used to go in to  L LE but not recently    Pain Onset 1 to 4 weeks ago    Pain Frequency Occasional    Aggravating Factors  squatting    Pain Relieving Factors topical analgesic cream    Effect of Pain on Daily Activities avoids squatting - uses a reacher to pick things up from the floor                [facility] PT Assessment - 03/08/21 0931       Assessment   Medical Diagnosis Lumbar radiculopathy    Referring Provider (PT) Laroy Apple, MD    Onset Date/Surgical Date --   ~1 month   Hand Dominance Right    Next MD Visit late Sept - virtual visit    Prior Therapy PT following first back surgery in 1990s      Precautions   Precautions Fall      Restrictions   Weight Bearing Restrictions No      Balance Screen   Has the patient fallen in the past 6 months Yes    How many times? 1   knocked over by the dog in the yard  Has the patient had a decrease in activity level because of a fear of falling?  No    Is the patient reluctant to leave their home because of a fear of falling?  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    AROM Assessment Site Lumbar    Lumbar Flexion hands to knees    Lumbar Extension 80% limited    Lumbar - Right Side Bend hand to mid thigh - LBP    Lumbar - Left Side Bend hand to mid thigh - LBP    Lumbar - Right Rotation 70% limited    Lumbar - Left Rotation 70% limited      Strength   Strength Assessment Site Hip;Knee;Ankle    Right/Left Hip Right;Left    Right Hip Flexion 4/5    Right Hip Extension --   limited ROM   Right Hip External Rotation  4-/5    Right Hip Internal Rotation 4+/5    Right Hip ABduction 4/5    Right Hip ADduction 4+/5    Left Hip Flexion 4-/5    Left Hip External Rotation 4-/5    Left Hip Internal Rotation 4-/5    Left Hip ABduction 4/5    Left Hip ADduction 4-/5    Right/Left Knee Right;Left    Right Knee Flexion 4+/5    Right Knee Extension 4+/5    Left Knee Flexion 4+/5    Left Knee Extension 4+/5    Right/Left Ankle Right;Left    Right Ankle Dorsiflexion 4/5    Left Ankle Dorsiflexion 4/5      Flexibility   Soft Tissue Assessment /Muscle Length yes    Hamstrings mod tight B    Quadriceps mod hip flexors tight L>R    ITB mod tight L>R    Piriformis mod tight B      Palpation   Spinal mobility severe spinal hypomobility    Palpation comment mild TTP over L lateral glutes; denies TTP along lumber spine                        Objective measurements completed on examination: See above findings.       [facility] Adult PT Treatment/Exercise - 03/08/21 0931       Exercises   Exercises Lumbar      Lumbar Exercises: Stretches   Passive Hamstring Stretch Left;1 rep;30 seconds  Passive Hamstring Stretch Limitations seated hip hinge    Single Knee to Chest Stretch Left;1 rep;30 seconds    Hip Flexor Stretch Left;1 rep;30 seconds    Hip Flexor Stretch Limitations mod thomas with opp SKTC    Piriformis Stretch Left;1 rep;30 seconds    Piriformis Stretch Limitations hooklying  KTOS                    PT Education - 03/08/21 1020     Education Details PT eval findings, anticipated POC & initial HEP - Access Code: [email or web address]    Person(s) Educated Patient    Methods Explanation;Demonstration;Verbal cues;Handout    Comprehension Verbalized understanding;Verbal cues required;Returned demonstration;Need further instruction              PT Short Term Goals - 03/08/21 1022       PT SHORT TERM GOAL #1   Title Patient will be independent with initial HEP    Status New    Target Date 03/29/21               PT Long Term Goals - 03/08/21 1022       PT LONG TERM GOAL #1   Title Patient will be independent with ongoing/advanced HEP for self-management at home    Status New    Target Date 04/19/21      PT LONG TERM GOAL #2   Title Patient will verbalize/demonstrate good awareness of neutral spine posture and proper body mechanics for daily tasks    Status New    Target Date 04/19/21      PT LONG TERM GOAL #3   Title Patient will demonstrate improved B proximal LE strength to >/= 4 to 4+/5 for improved stability and ease of mobility    Status New    Target Date 04/19/21      PT LONG TERM GOAL #4   Title Patient to report ability to perform ADLs, household, and leisure activities without limitation due to LBP, L LE pain or weakness    Status New    Target Date 04/19/21                    Plan - 03/08/21 1022     Clinical Impression Statement Deva is a 75 y/o female who presents to OP PT for acute onset LBP and L lumbar radiculopathy s/p a fall in her yard ~1 month ago when the dog knocked her down. Pain has been becoming more intermittent since the initial injury but still limits her functionally with activities such as squatting to pick things up from the floor (has to use a reacher/grabber). She does attest to a remote h/o back surgery x 2 (1990s & 2004) but does not recall the details of the surgeries. Current deficits include  intermittent L sided LBP with L radiculopathy, increased muscle tension, significantly limited lumbar AROM in all planes with increased LBP with B side bending, moderately limited proximal LE flexibility and L>R proximal LE weakness. Eddie will benefit from skilled PT to address above deficits to reduce myofascial pain and tightness and improve core strength to restore functional mobility and allow for increased participation in desired activities.    Personal Factors and Comorbidities Time since onset of injury/illness/exacerbation;Past/Current Experience;Age;Fitness;Comorbidity 3+    Comorbidities OA/DJD, back surgery x 2, osteopenia, HTN, HLD, SVT, GERD    Examination-Activity Limitations Bathing;Bend;Lift;Squat    Examination-Participation Restrictions Cleaning;Community Activity;Laundry;Meal Prep;Shop;Yard Work    Stability/Clinical Decision Making Stable/Uncomplicated  Clinical Decision Making Low    Rehab Potential Good    PT Frequency 2x / week    PT Duration 6 weeks    PT Treatment/Interventions ADLs/Self Care Home Management;Cryotherapy;Electrical Stimulation;Iontophoresis '4mg'$ /ml Dexamethasone;Moist Heat;Ultrasound;Gait training;Functional mobility training;Therapeutic activities;Therapeutic exercise;Balance training;Neuromuscular re-education;Patient/family education;Manual techniques;Passive range of motion;Dry needling;Taping;Spinal Manipulations    PT Next Visit Plan Review initial HEP and progress lumbopelvic flexibilty/ROM as tolerated; initiate core/lumbopelvic strengthening    PT Home Exercise Plan Access Code: [email or web address] (8/25)    Consulted and Agree with Plan of Care Patient             Patient will benefit from skilled therapeutic intervention in order to improve the following deficits and impairments:  Decreased activity tolerance, Decreased knowledge of precautions, Decreased mobility, Decreased range of motion, Decreased strength, Hypomobility, Increased fascial  restricitons, Increased muscle spasms, Impaired perceived functional ability, Impaired flexibility, Improper body mechanics, Postural dysfunction, Pain  Visit Diagnosis: Radiculopathy, lumbar region  Acute left-sided low back pain without sciatica  Muscle weakness (generalized)  Other symptoms and signs involving the musculoskeletal system     Problem List Patient Active Problem List   Diagnosis Date Noted   Allergic rhinitis 11/10/2019   Asthma, chronic 09/28/2013   HBP (high blood pressure) 09/28/2013    Percival Spanish, PT, MPT 03/08/2021, 6:53 PM  [facility] [address]  [place], [place], [address] Phone: [phone]   Fax:  [phone]  Name: STEPHNEY RUZICH MRN: [id number] Date of Birth: 05-Aug-1945

## 2021-03-08 NOTE — Patient Instructions (Signed)
    Access Code: [email or web address] URL: [email or web address] Date: 03/08/2021 Prepared by: Annie Paras  Exercises Supine Piriformis Stretch with Foot on Ground - 2-3 x daily - 7 x weekly - 3 reps - 30 sec hold Hip Flexor Stretch at Edge of Bed - 2-3 x daily - 7 x weekly - 3 reps - 30 sec hold Seated Hamstring Stretch - 2-3 x daily - 7 x weekly - 3 reps - 30 sec hold

## 2021-03-14 ENCOUNTER — Ambulatory Visit: Payer: PPO | Admitting: Physical Therapy

## 2021-03-14 ENCOUNTER — Encounter: Payer: Self-pay | Admitting: Physical Therapy

## 2021-03-14 ENCOUNTER — Other Ambulatory Visit: Payer: Self-pay

## 2021-03-14 DIAGNOSIS — M545 Low back pain, unspecified: Secondary | ICD-10-CM

## 2021-03-14 DIAGNOSIS — R29898 Other symptoms and signs involving the musculoskeletal system: Secondary | ICD-10-CM

## 2021-03-14 DIAGNOSIS — M5416 Radiculopathy, lumbar region: Secondary | ICD-10-CM

## 2021-03-14 DIAGNOSIS — M6281 Muscle weakness (generalized): Secondary | ICD-10-CM

## 2021-03-14 NOTE — Therapy (Signed)
[facility] [address]  [place], [place], [address] Phone: [phone]   Fax:  [phone]  Physical Therapy Treatment  Patient Details  Name: Denise Gonzales MRN: [id number] Date of Birth: May 14, 1946 Referring Provider (PT): Laroy Apple, MD   Encounter Date: 03/14/2021   PT End of Session - 03/14/21 0851     Visit Number 2    Number of Visits 12    Date for PT Re-Evaluation 04/19/21    Authorization Type [employer]    PT Start Time 0851    PT Stop Time 0932    PT Time Calculation (min) 41 min    Activity Tolerance Patient tolerated treatment well    Behavior During Therapy [facility] for tasks assessed/performed             Past Medical History:  Diagnosis Date   Asthma    DJD (degenerative joint disease)    GERD (gastroesophageal reflux disease)    History of PSVT (paroxysmal supraventricular tachycardia)    Hyperlipidemia    Hypertension    Osteopenia     Past Surgical History:  Procedure Laterality Date   BACK SURGERY  1990 and 2004   BREAST EXCISIONAL BIOPSY     BREAST LUMPECTOMY     CERVICAL POLYPECTOMY      There were no vitals filed for this visit.   Subjective Assessment - 03/14/21 0854     Subjective Pt reports she was able to complete HEP everyday except one - unable to get on the floor but did all exercises from the bed.    Patient Stated Goals "to almost be where I used to be before"    Currently in Pain? Yes    Pain Score 2     Pain Location Back    Pain Orientation Lower    Pain Descriptors / Indicators Other (Comment)   "little pain"   Pain Type Acute pain                               [facility] Adult PT Treatment/Exercise - 03/14/21 0851       Exercises   Exercises Lumbar      Lumbar Exercises: Stretches   Passive Hamstring Stretch Left;1 rep;30 seconds    Passive Hamstring Stretch Limitations seated hip hinge   cues to avoid felxing trunk   Lower  Trunk Rotation Limitations hooklying 10 x 5"    Hip Flexor Stretch Left;1 rep;30 seconds    Hip Flexor Stretch Limitations mod thomas with opp SKTC    Piriformis Stretch Left;1 rep;30 seconds    Piriformis Stretch Limitations hooklying KTOS      Lumbar Exercises: Aerobic   Nustep L4 x 6 min (UE/LE)      Lumbar Exercises: Supine   Ab Set 10 reps;5 seconds    Pelvic Tilt 10 reps;5 seconds    Pelvic Tilt Limitations PPT - pt having difficulty initiating tilt    Clam 10 reps;3 seconds   2 sets   Clam Limitations TrA + bent knee fallout; attempted 2nd set as alt hip ABD/ER with looped yellow TB at knees but reverted to bent knee fallout d/t limited control on L   cues to keep pelvis level & avoid trunk rotation   Bent Knee Raise 10 reps;3 seconds   2 sets   Bent Knee Raise Limitations brace marching; 2nd set with looped yellow TB at knees  Bridge Limitations attempted but pt unable create much lift and reporting increased back pain      Lumbar Exercises: Sidelying   Clam Left;10 reps;3 seconds                    PT Education - 03/14/21 0930     Education Details HEP update - basic lumboplevic strengthening - Access Code: [email or web address]    Person(s) Educated Patient    Methods Explanation;Demonstration;Verbal cues;Tactile cues;Handout    Comprehension Verbalized understanding;Verbal cues required;Tactile cues required;Returned demonstration;Need further instruction              PT Short Term Goals - 03/14/21 0857       PT SHORT TERM GOAL #1   Title Patient will be independent with initial HEP    Status On-going    Target Date 03/29/21               PT Long Term Goals - 03/14/21 0858       PT LONG TERM GOAL #1   Title Patient will be independent with ongoing/advanced HEP for self-management at home    Status On-going    Target Date 04/19/21      PT LONG TERM GOAL #2   Title Patient will verbalize/demonstrate good awareness of neutral spine posture and  proper body mechanics for daily tasks    Status On-going    Target Date 04/19/21      PT LONG TERM GOAL #3   Title Patient will demonstrate improved B proximal LE strength to >/= 4 to 4+/5 for improved stability and ease of mobility    Status On-going    Target Date 04/19/21      PT LONG TERM GOAL #4   Title Patient to report ability to perform ADLs, household, and leisure activities without limitation due to LBP, L LE pain or weakness    Status On-going    Target Date 04/19/21                   Plan - 03/14/21 0858     Clinical Impression Statement Denise Gonzales reports good compliance with HEP and able to provide good return demonstration with mainly confirmation other than clarification of upright trunk with hip hinge for seated HS stretch. Progressed ROM/flexibility exercises and initiated lumbopelvic strengthening with mixed tolerance, limited predominantly by lateral L LE weakness and occasional increased LBP with some exercise attempts. HEP updated to reflect strengthening progress for well tolerated exercises.    Comorbidities OA/DJD, back surgery x 2, osteopenia, HTN, HLD, SVT, GERD    Rehab Potential Good    PT Frequency 2x / week    PT Duration 6 weeks    PT Treatment/Interventions ADLs/Self Care Home Management;Cryotherapy;Electrical Stimulation;Iontophoresis '4mg'$ /ml Dexamethasone;Moist Heat;Ultrasound;Gait training;Functional mobility training;Therapeutic activities;Therapeutic exercise;Balance training;Neuromuscular re-education;Patient/family education;Manual techniques;Passive range of motion;Dry needling;Taping;Spinal Manipulations    PT Next Visit Plan progress lumbopelvic flexibilty/ROM as tolerated; core/lumbopelvic strengthening    PT Home Exercise Plan Access Code: [email or web address] (8/25, updated 8/31)    Consulted and Agree with Plan of Care Patient             Patient will benefit from skilled therapeutic intervention in order to improve the following deficits and  impairments:  Decreased activity tolerance, Decreased knowledge of precautions, Decreased mobility, Decreased range of motion, Decreased strength, Hypomobility, Increased fascial restricitons, Increased muscle spasms, Impaired perceived functional ability, Impaired flexibility, Improper body mechanics, Postural dysfunction, Pain  Visit Diagnosis: Radiculopathy, lumbar region  Acute left-sided low  back pain without sciatica  Muscle weakness (generalized)  Other symptoms and signs involving the musculoskeletal system     Problem List Patient Active Problem List   Diagnosis Date Noted   Allergic rhinitis 11/10/2019   Asthma, chronic 09/28/2013   HBP (high blood pressure) 09/28/2013    Percival Spanish, PT, MPT 03/14/2021, 10:39 AM  [facility] [address]  [place], [place], [address] Phone: [phone]   Fax:  [phone]  Name: Denise Gonzales MRN: [id number] Date of Birth: 1946/07/13

## 2021-03-14 NOTE — Patient Instructions (Signed)
      Access Code: [email or web address] URL: [email or web address] Date: 03/14/2021 Prepared by: Annie Paras  Exercises Supine Piriformis Stretch with Foot on Ground - 2-3 x daily - 7 x weekly - 3 reps - 30 sec hold Hip Flexor Stretch at Edge of Bed - 2-3 x daily - 7 x weekly - 3 reps - 30 sec hold Seated Hamstring Stretch - 2-3 x daily - 7 x weekly - 3 reps - 30 sec hold Supine Transversus Abdominis Bracing - Hands on Stomach - 2-3 x daily - 7 x weekly - 2 sets - 10 reps - 5 sec hold Bent Knee Fallouts with Alternating Legs - 1 x daily - 7 x weekly - 2 sets - 10 reps - 3 sec hold Supine March with Resistance Band - 1 x daily - 7 x weekly - 2 sets - 10 reps - 2-3 sec hold hold Clamshell - 1 x daily - 7 x weekly - 2 sets - 10 reps - 3 sec hold

## 2021-03-27 ENCOUNTER — Other Ambulatory Visit: Payer: Self-pay

## 2021-03-27 ENCOUNTER — Ambulatory Visit: Payer: PPO | Attending: Physical Medicine and Rehabilitation

## 2021-03-27 DIAGNOSIS — M545 Low back pain, unspecified: Secondary | ICD-10-CM | POA: Diagnosis not present

## 2021-03-27 DIAGNOSIS — M5416 Radiculopathy, lumbar region: Secondary | ICD-10-CM | POA: Insufficient documentation

## 2021-03-27 DIAGNOSIS — M6281 Muscle weakness (generalized): Secondary | ICD-10-CM | POA: Insufficient documentation

## 2021-03-27 DIAGNOSIS — R29898 Other symptoms and signs involving the musculoskeletal system: Secondary | ICD-10-CM | POA: Diagnosis not present

## 2021-03-27 NOTE — Therapy (Signed)
[facility] [address]  [place], [place], [address] Phone: [phone]   Fax:  [phone]  Physical Therapy Treatment  Patient Details  Name: Denise Gonzales MRN: [id number] Date of Birth: 1945-12-09 Referring Provider (PT): Laroy Apple, MD   Encounter Date: 03/27/2021   PT End of Session - 03/27/21 1154     Visit Number 3    Number of Visits 12    Date for PT Re-Evaluation 04/19/21    Authorization Type [employer]    PT Start Time 1106   pt late   PT Stop Time 1146    PT Time Calculation (min) 40 min    Activity Tolerance Patient tolerated treatment well;Patient limited by pain    Behavior During Therapy [facility] for tasks assessed/performed             Past Medical History:  Diagnosis Date   Asthma    DJD (degenerative joint disease)    GERD (gastroesophageal reflux disease)    History of PSVT (paroxysmal supraventricular tachycardia)    Hyperlipidemia    Hypertension    Osteopenia     Past Surgical History:  Procedure Laterality Date   BACK SURGERY  1990 and 2004   BREAST EXCISIONAL BIOPSY     BREAST LUMPECTOMY     CERVICAL POLYPECTOMY      There were no vitals filed for this visit.   Subjective Assessment - 03/27/21 1109     Subjective Pt notes that she is doing well, no concerns on HEP. Only gets occassional pains at times.    Patient Stated Goals "to almost be where I used to be before"    Currently in Pain? No/denies                               [facility] Adult PT Treatment/Exercise - 03/27/21 0001       Exercises   Exercises Lumbar      Lumbar Exercises: Stretches   Passive Hamstring Stretch Left;1 rep;30 seconds;Right    Passive Hamstring Stretch Limitations seated hip hinge    Lower Trunk Rotation Limitations hooklying 10 reps    Piriformis Stretch Right;Left;2 reps;30 seconds    Piriformis Stretch Limitations hooklying KTOS      Lumbar Exercises: Aerobic    Nustep L5x 6 min (UE/LE)      Lumbar Exercises: Supine   Clam 10 reps;3 seconds    Clam Limitations TrA + bent knee fallout with yellow TB   decreased ROM   Bridge 5 reps    Bridge Limitations decreased ROM but less pain noted    Straight Leg Raise 10 reps    Straight Leg Raises Limitations small range    Isometric Hip Flexion 5 reps;5 seconds    Isometric Hip Flexion Limitations with small beach ball    Other Supine Lumbar Exercises hamstring curls with beach ball 10 reps   cues for control                      PT Short Term Goals - 03/27/21 1124       PT SHORT TERM GOAL #1   Title Patient will be independent with initial HEP    Status Achieved    Target Date 03/29/21               PT Long Term Goals - 03/14/21 1761  PT LONG TERM GOAL #1   Title Patient will be independent with ongoing/advanced HEP for self-management at home    Status On-going    Target Date 04/19/21      PT LONG TERM GOAL #2   Title Patient will verbalize/demonstrate good awareness of neutral spine posture and proper body mechanics for daily tasks    Status On-going    Target Date 04/19/21      PT LONG TERM GOAL #3   Title Patient will demonstrate improved B proximal LE strength to >/= 4 to 4+/5 for improved stability and ease of mobility    Status On-going    Target Date 04/19/21      PT LONG TERM GOAL #4   Title Patient to report ability to perform ADLs, household, and leisure activities without limitation due to LBP, L LE pain or weakness    Status On-going    Target Date 04/19/21                   Plan - 03/27/21 1158     Clinical Impression Statement Pt demonstrated a good performance of her exercises from the intial HEP- STG 1 met. Progressed supine clams with yellow TB, she did show less ROM but better control with movements. She was also able to do bridges with reduced repetitions, and mild pain reported in low back. Also focused on exercises to  facilitate TrA contraction to keep stability of the spine. She still is limited by L LE weakness on the lateral portion but showing decreases in pain levels. No further concerns from pt post session.    Personal Factors and Comorbidities Time since onset of injury/illness/exacerbation;Past/Current Experience;Age;Fitness;Comorbidity 3+    Comorbidities OA/DJD, back surgery x 2, osteopenia, HTN, HLD, SVT, GERD    PT Frequency 2x / week    PT Duration 6 weeks    PT Treatment/Interventions ADLs/Self Care Home Management;Cryotherapy;Electrical Stimulation;Iontophoresis 34m/ml Dexamethasone;Moist Heat;Ultrasound;Gait training;Functional mobility training;Therapeutic activities;Therapeutic exercise;Balance training;Neuromuscular re-education;Patient/family education;Manual techniques;Passive range of motion;Dry needling;Taping;Spinal Manipulations    PT Next Visit Plan progress lumbopelvic flexibilty/ROM as tolerated; core/lumbopelvic strengthening    PT Home Exercise Plan Access Code: [email or web address] (8/25, updated 8/31)    Consulted and Agree with Plan of Care Patient             Patient will benefit from skilled therapeutic intervention in order to improve the following deficits and impairments:  Decreased activity tolerance, Decreased knowledge of precautions, Decreased mobility, Decreased range of motion, Decreased strength, Hypomobility, Increased fascial restricitons, Increased muscle spasms, Impaired perceived functional ability, Impaired flexibility, Improper body mechanics, Postural dysfunction, Pain  Visit Diagnosis: Radiculopathy, lumbar region  Acute left-sided low back pain without sciatica  Muscle weakness (generalized)  Other symptoms and signs involving the musculoskeletal system     Problem List Patient Active Problem List   Diagnosis Date Noted   Allergic rhinitis 11/10/2019   Asthma, chronic 09/28/2013   HBP (high blood pressure) 09/28/2013    BArtist Pais  PTA 03/27/2021, 12:14 PM  [facility] [address] [place] [address]: [phone]  Fax:  [phone] Name: Denise COUSEMRN: [id number] of Birth: 904/10/1945

## 2021-04-03 ENCOUNTER — Encounter: Payer: Self-pay | Admitting: Physical Therapy

## 2021-04-03 ENCOUNTER — Other Ambulatory Visit: Payer: Self-pay

## 2021-04-03 ENCOUNTER — Ambulatory Visit: Payer: PPO | Admitting: Physical Therapy

## 2021-04-03 DIAGNOSIS — M6281 Muscle weakness (generalized): Secondary | ICD-10-CM

## 2021-04-03 DIAGNOSIS — M5416 Radiculopathy, lumbar region: Secondary | ICD-10-CM

## 2021-04-03 DIAGNOSIS — M545 Low back pain, unspecified: Secondary | ICD-10-CM

## 2021-04-03 DIAGNOSIS — R29898 Other symptoms and signs involving the musculoskeletal system: Secondary | ICD-10-CM

## 2021-04-03 NOTE — Therapy (Signed)
[facility] [address]  [place], [place], [place] Phone: [phone]   Fax:  [phone]  Physical Therapy Treatment  Patient Details  Name: Denise Gonzales MRN: [id number] Date of Birth: [birth year] Referring Provider (PT): Denise Apple, MD   Encounter Date: 04/03/2021   PT End of Session - 04/03/21 1412     Visit Number 4    Number of Visits 12    Date for PT Re-Evaluation 04/19/21    Authorization Type [employer]    PT Start Time 1412    PT Stop Time 1452    PT Time Calculation (min) 40 min    Activity Tolerance Patient tolerated treatment well    Behavior During Therapy [facility] for tasks assessed/performed             Past Medical History:  Diagnosis Date   Asthma    DJD (degenerative joint disease)    GERD (gastroesophageal reflux disease)    History of PSVT (paroxysmal supraventricular tachycardia)    Hyperlipidemia    Hypertension    Osteopenia     Past Surgical History:  Procedure Laterality Date   BACK SURGERY  1990 and 2004   BREAST EXCISIONAL BIOPSY     BREAST LUMPECTOMY     CERVICAL POLYPECTOMY      There were no vitals filed for this visit.   Subjective Assessment - 04/03/21 1414     Subjective Pt reports her pain is better - now just at her waistline.    Patient Stated Goals "to almost be where I used to be before"    Currently in Pain? No/denies    Pain Score 0-No pain   up to 5/10, typically later in the day or when going to bed   Pain Location Back    Pain Orientation Lower    Pain Type Acute pain    Pain Radiating Towards no longer radiating into the L buttocks or LE    Pain Frequency Occasional                               [facility] Adult PT Treatment/Exercise - 04/03/21 1412       Exercises   Exercises Lumbar      Lumbar Exercises: Aerobic   Nustep L5 x 6 min (UE/LE)      Lumbar Exercises: Seated   Hip Flexion on Ball Right;Left;10  reps;Strengthening   2 sets   Hip Flexion on Ball Limitations 1st set on mat table with Airex pad under feet; 2nd set seated on dynadisc with feet elevated on 4" step + Airex pad    Other Seated Lumbar Exercises TrA & alt oblique isometrics pressing into ball on lap 10 x 5" each    Other Seated Lumbar Exercises B yellow TB row & scap retraction/extension x 10 each, R/L yellow TB pallof press x 10; seated on dynadisc with feet elevated on 4" step + Airex pad   repeated cues for abd bracing & upright posture     Lumbar Exercises: Supine   Bridge 10 reps;3 seconds    Bridge Limitations + yellow TB hip ABD isometric   improving lift with no pain     Lumbar Exercises: Sidelying   Clam Right;Left;10 reps;3 seconds    Clam Limitations yellow TB      Lumbar Exercises: Quadruped   Madcat/Old Horse 10 reps    Madcat/Old Horse Limitations  limited ability to allow back to "sag"    Straight Leg Raise 5 reps;3 seconds                       PT Short Term Goals - 04/03/21 1418       PT SHORT TERM GOAL #1   Title Patient will be independent with initial HEP    Status Achieved   03/27/21              PT Long Term Goals - 04/03/21 1418       PT LONG TERM GOAL #1   Title Patient will be independent with ongoing/advanced HEP for self-management at home    Status Partially Met    Target Date 04/19/21      PT LONG TERM GOAL #2   Title Patient will verbalize/demonstrate good awareness of neutral spine posture and proper body mechanics for daily tasks    Status On-going    Target Date 04/19/21      PT LONG TERM GOAL #3   Title Patient will demonstrate improved B proximal LE strength to >/= 4 to 4+/5 for improved stability and ease of mobility    Status On-going    Target Date 04/19/21      PT LONG TERM GOAL #4   Title Patient to report ability to perform ADLs, household, and leisure activities without limitation due to LBP, L LE pain or weakness    Status On-going    Target  Date 04/19/21                   Plan - 04/03/21 1419     Clinical Impression Statement [name] reports centralization of her LE radicular pain with pain now only at her waistline intermittently, typically later in the day. She reports HEP going well and denies need for review. She is demonstrating improving tolerance for bridges w/o pain interference but still with limited lift and was able to tolerate progression to yellow TB resistance with sidelying clams. Introduced lumbopelvic mobilization and strengthening in quadruped and seated to promote increased tolerance for upright activity and mobility. Repeated cues necessary for upright postural awareness in sitting during exercises but otherwise well tolerated. Will reassess strength next visit and continue to progress strengthening to improve lumbopelvic stability and increase activity tolerance to promote further pain reduction.    Comorbidities OA/DJD, back surgery x 2, osteopenia, HTN, HLD, SVT, GERD    Rehab Potential Good    PT Frequency 2x / week    PT Duration 6 weeks    PT Treatment/Interventions ADLs/Self Care Home Management;Cryotherapy;Electrical Stimulation;Iontophoresis 42m/ml Dexamethasone;Moist Heat;Ultrasound;Gait training;Functional mobility training;Therapeutic activities;Therapeutic exercise;Balance training;Neuromuscular re-education;Patient/family education;Manual techniques;Passive range of motion;Dry needling;Taping;Spinal Manipulations    PT Next Visit Plan reassess LE strength; progress lumbopelvic flexibilty/ROM as tolerated; progress core/lumbopelvic strengthening; HEP update as indicated    PT Home Exercise Plan Access Code: [email or web address] (8/25, updated 8/31)    Consulted and Agree with Plan of Care Patient             Patient will benefit from skilled therapeutic intervention in order to improve the following deficits and impairments:  Decreased activity tolerance, Decreased knowledge of precautions, Decreased  mobility, Decreased range of motion, Decreased strength, Hypomobility, Increased fascial restricitons, Increased muscle spasms, Impaired perceived functional ability, Impaired flexibility, Improper body mechanics, Postural dysfunction, Pain  Visit Diagnosis: Radiculopathy, lumbar region  Acute left-sided low back pain without sciatica  Muscle weakness (generalized)  Other symptoms and signs  involving the musculoskeletal system     Problem List Patient Active Problem List   Diagnosis Date Noted   Allergic rhinitis 11/10/2019   Asthma, chronic 09/28/2013   HBP (high blood pressure) 09/28/2013    Percival Spanish, PT 04/03/2021, 7:40 PM  [facility] [address]  [place], [place], [address] Phone: [phone]   Fax:  [phone]  Name: Denise [place] MRN: [id number] Date of Birth: 02-13-1946

## 2021-04-11 ENCOUNTER — Ambulatory Visit: Payer: PPO

## 2021-04-11 ENCOUNTER — Other Ambulatory Visit: Payer: Self-pay

## 2021-04-11 DIAGNOSIS — R29898 Other symptoms and signs involving the musculoskeletal system: Secondary | ICD-10-CM

## 2021-04-11 DIAGNOSIS — M5416 Radiculopathy, lumbar region: Secondary | ICD-10-CM | POA: Diagnosis not present

## 2021-04-11 DIAGNOSIS — M6281 Muscle weakness (generalized): Secondary | ICD-10-CM

## 2021-04-11 DIAGNOSIS — M545 Low back pain, unspecified: Secondary | ICD-10-CM

## 2021-04-11 NOTE — Therapy (Signed)
[facility] [address]  [place], [place], [address] Phone: [phone]   Fax:  [phone]  Physical Therapy Treatment  Patient Details  Name: Denise Gonzales MRN: [id number] Date of Birth: 1945/10/13 Referring Provider (PT): Laroy Apple, MD   Encounter Date: 04/11/2021   PT End of Session - 04/11/21 1024     Visit Number 5    Number of Visits 12    Date for PT Re-Evaluation 04/19/21    Authorization Type [employer]    PT Start Time 0935    PT Stop Time 1015    PT Time Calculation (min) 40 min    Activity Tolerance Patient tolerated treatment well    Behavior During Therapy [facility] for tasks assessed/performed             Past Medical History:  Diagnosis Date   Asthma    DJD (degenerative joint disease)    GERD (gastroesophageal reflux disease)    History of PSVT (paroxysmal supraventricular tachycardia)    Hyperlipidemia    Hypertension    Osteopenia     Past Surgical History:  Procedure Laterality Date   BACK SURGERY  1990 and 2004   BREAST EXCISIONAL BIOPSY     BREAST LUMPECTOMY     CERVICAL POLYPECTOMY      There were no vitals filed for this visit.   Subjective Assessment - 04/11/21 0940     Subjective Pt reported that her back pain is better and she only has pain when she is moving around doing things.    Patient Stated Goals "to almost be where I used to be before"    Currently in Pain? No/denies                [facility] PT Assessment - 04/11/21 0001       Strength   Right Hip Flexion 4+/5    Right Hip External Rotation  4+/5    Right Hip Internal Rotation 4+/5    Right Hip ABduction 4+/5    Right Hip ADduction 4+/5    Left Hip External Rotation 4+/5    Left Hip Internal Rotation 4+/5    Left Hip ABduction 4+/5    Left Hip ADduction 4/5    Right Ankle Dorsiflexion 4+/5    Left Ankle Dorsiflexion 4+/5                           [facility] Adult PT  Treatment/Exercise - 04/11/21 0001       Lumbar Exercises: Aerobic   Nustep L5 x 6 min (UE/LE)      Lumbar Exercises: Standing   Row Strengthening;Both;20 reps;Theraband    Theraband Level (Row) Level 1 (Yellow)    Shoulder Extension Strengthening;Both;20 reps;Theraband    Theraband Level (Shoulder Extension) Level 1 (Yellow)    Other Standing Lumbar Exercises bird dog with counter support 10 reps      Lumbar Exercises: Seated   Other Seated Lumbar Exercises trunk rotations with red weight ball 10 reps each way    Other Seated Lumbar Exercises B side bends with UEs 10 reps each way                       PT Short Term Goals - 04/03/21 1418       PT SHORT TERM GOAL #1   Title Patient will be independent with initial HEP    Status  Achieved   03/27/21              PT Long Term Goals - 04/11/21 0958       PT LONG TERM GOAL #1   Title Patient will be independent with ongoing/advanced HEP for self-management at home    Status Partially Met      PT LONG TERM GOAL #2   Title Patient will verbalize/demonstrate good awareness of neutral spine posture and proper body mechanics for daily tasks    Status On-going      PT LONG TERM GOAL #3   Title Patient will demonstrate improved B proximal LE strength to >/= 4 to 4+/5 for improved stability and ease of mobility    Status Partially Met   L hip add limited and B extension     PT LONG TERM GOAL #4   Title Patient to report ability to perform ADLs, household, and leisure activities without limitation due to LBP, L LE pain or weakness    Status On-going                   Plan - 04/11/21 1024     Clinical Impression Statement Pt shows improvements in B L E strength but limited with hip extension and L hip add. She has made good progress with LE strength but still shows slouched posture at rest with posterior pelvic tilt. Incorporated posture exercises to strengthen the periscaps and stretch the pecs to  improve postural alignment. She needed cues to emphasize trunk rotation and for thoracic extension during scap stab to improve muscle engagement. Pt responded well and is making good progress.    Personal Factors and Comorbidities Time since onset of injury/illness/exacerbation;Past/Current Experience;Age;Fitness;Comorbidity 3+    Comorbidities OA/DJD, back surgery x 2, osteopenia, HTN, HLD, SVT, GERD    PT Frequency 2x / week    PT Duration 6 weeks    PT Treatment/Interventions ADLs/Self Care Home Management;Cryotherapy;Electrical Stimulation;Iontophoresis 73m/ml Dexamethasone;Moist Heat;Ultrasound;Gait training;Functional mobility training;Therapeutic activities;Therapeutic exercise;Balance training;Neuromuscular re-education;Patient/family education;Manual techniques;Passive range of motion;Dry needling;Taping;Spinal Manipulations    PT Next Visit Plan incorporate posture exercises; progress lumbopelvic flexibilty/ROM as tolerated; progress core/lumbopelvic strengthening; HEP update as indicated    PT Home Exercise Plan Access Code: [email or web address] (8/25, updated 8/31)    Consulted and Agree with Plan of Care Patient             Patient will benefit from skilled therapeutic intervention in order to improve the following deficits and impairments:  Decreased activity tolerance, Decreased knowledge of precautions, Decreased mobility, Decreased range of motion, Decreased strength, Hypomobility, Increased fascial restricitons, Increased muscle spasms, Impaired perceived functional ability, Impaired flexibility, Improper body mechanics, Postural dysfunction, Pain  Visit Diagnosis: Radiculopathy, lumbar region  Acute left-sided low back pain without sciatica  Muscle weakness (generalized)  Other symptoms and signs involving the musculoskeletal system     Problem List Patient Active Problem List   Diagnosis Date Noted   Allergic rhinitis 11/10/2019   Asthma, chronic 09/28/2013   HBP (high  blood pressure) 09/28/2013    BArtist Pais PTA 04/11/2021, 11:55 AM  [facility] [place] [address]Phone: [phone]  Fax:  [phone] Name: Denise BENNINGMRN: [id number] of Birth: 9November 21, 1947

## 2021-04-12 DIAGNOSIS — M5136 Other intervertebral disc degeneration, lumbar region: Secondary | ICD-10-CM | POA: Diagnosis not present

## 2021-04-12 DIAGNOSIS — M5416 Radiculopathy, lumbar region: Secondary | ICD-10-CM | POA: Diagnosis not present

## 2021-04-13 ENCOUNTER — Other Ambulatory Visit: Payer: Self-pay

## 2021-04-13 ENCOUNTER — Encounter: Payer: Self-pay | Admitting: Physical Therapy

## 2021-04-13 ENCOUNTER — Ambulatory Visit: Payer: PPO | Admitting: Physical Therapy

## 2021-04-13 DIAGNOSIS — M6281 Muscle weakness (generalized): Secondary | ICD-10-CM

## 2021-04-13 DIAGNOSIS — M5416 Radiculopathy, lumbar region: Secondary | ICD-10-CM

## 2021-04-13 DIAGNOSIS — M545 Low back pain, unspecified: Secondary | ICD-10-CM

## 2021-04-13 DIAGNOSIS — R29898 Other symptoms and signs involving the musculoskeletal system: Secondary | ICD-10-CM

## 2021-04-13 NOTE — Therapy (Signed)
[facility] [address]  [place], [place], [address] Phone: [phone]   Fax:  [phone]  Physical Therapy Treatment  Patient Details  Name: Denise Gonzales MRN: [id number] Date of Birth: 23-Dec-1945 Referring Provider (PT): Laroy Apple, MD   Encounter Date: 04/13/2021   PT End of Session - 04/13/21 0901     Visit Number 6    Number of Visits 12    Date for PT Re-Evaluation 04/19/21    Authorization Type [employer]    PT Start Time 0901   Pt arrived late   PT Stop Time 0933    PT Time Calculation (min) 32 min    Activity Tolerance Patient tolerated treatment well    Behavior During Therapy [facility] for tasks assessed/performed             Past Medical History:  Diagnosis Date   Asthma    DJD (degenerative joint disease)    GERD (gastroesophageal reflux disease)    History of PSVT (paroxysmal supraventricular tachycardia)    Hyperlipidemia    Hypertension    Osteopenia     Past Surgical History:  Procedure Laterality Date   BACK SURGERY  1990 and 2004   BREAST EXCISIONAL BIOPSY     BREAST LUMPECTOMY     CERVICAL POLYPECTOMY      There were no vitals filed for this visit.   Subjective Assessment - 04/13/21 0904     Subjective Pt reports she is careful with how she does things around the house to avoid increasing her back pain.    Patient Stated Goals "to almost be where I used to be before"    Currently in Pain? Yes    Pain Score 2     Pain Location Back    Pain Orientation Lower;Right    Pain Descriptors / Indicators Other (Comment)   "little pain"   Pain Type Acute pain    Pain Radiating Towards denies    Pain Frequency Occasional                               [facility] Adult PT Treatment/Exercise - 04/13/21 0901       Lumbar Exercises: Aerobic   Nustep L5 x 4 min (UE/LE)      Lumbar Exercises: Standing   Row Both;10 reps;Strengthening;Theraband    Theraband Level  (Row) Level 1 (Yellow)    Row Limitations cues for scap retraction & abd bracing    Shoulder Extension Both;10 reps;Strengthening;Theraband    Theraband Level (Shoulder Extension) Level 1 (Yellow)    Shoulder Extension Limitations cues for scap retraction & abd bracing      Lumbar Exercises: Supine   Clam 10 reps;3 seconds    Clam Limitations TrA + red TB alt hip ABD/ER    Bent Knee Raise 10 reps;3 seconds    Bent Knee Raise Limitations brace marching with red TB at knees    Bridge 10 reps;3 seconds    Bridge Limitations + red TB hip ABD isometric      Lumbar Exercises: Sidelying   Clam Right;Left;10 reps;3 seconds    Clam Limitations red TB                     PT Education - 04/13/21 0932     Education Details HEP update -progression of lumboplevic strengthening - Access Code: [email or web address]    Person(s) Educated  Patient    Methods Explanation;Demonstration;Verbal cues;Handout    Comprehension Verbalized understanding;Verbal cues required;Returned demonstration;Need further instruction              PT Short Term Goals - 04/03/21 1418       PT SHORT TERM GOAL #1   Title Patient will be independent with initial HEP    Status Achieved   03/27/21              PT Long Term Goals - 04/11/21 0958       PT LONG TERM GOAL #1   Title Patient will be independent with ongoing/advanced HEP for self-management at home    Status Partially Met      PT LONG TERM GOAL #2   Title Patient will verbalize/demonstrate good awareness of neutral spine posture and proper body mechanics for daily tasks    Status On-going      PT LONG TERM GOAL #3   Title Patient will demonstrate improved B proximal LE strength to >/= 4 to 4+/5 for improved stability and ease of mobility    Status Partially Met   L hip add limited and B extension     PT LONG TERM GOAL #4   Title Patient to report ability to perform ADLs, household, and leisure activities without limitation due to LBP, L LE  pain or weakness    Status On-going                   Plan - 04/13/21 0935     Clinical Impression Statement Denise Gonzales reports she still has some occasional pain but is able to avoid triggering the pain as long as she careful about how she does things at home. Progressed lumbopelvic strengthening targeting areas of remaining weakness identified on recent MMT as well as postural strengthening to promote more neutral spinal alignment - well tolerated with HEP updated to reflect exercise progression. Pt currently has 2 visits remaining in her certification window but is unsure if she feels ready to transition to her HEP at the end of the current episode - will f/u next week to assess response to HEP progression and provide further review as needed, with formal goal assessment and determination of need for recert vs transition to HEP on final visit.    Comorbidities OA/DJD, back surgery x 2, osteopenia, HTN, HLD, SVT, GERD    Rehab Potential Good    PT Frequency 2x / week    PT Duration 6 weeks    PT Treatment/Interventions ADLs/Self Care Home Management;Cryotherapy;Electrical Stimulation;Iontophoresis 31m/ml Dexamethasone;Moist Heat;Ultrasound;Gait training;Functional mobility training;Therapeutic activities;Therapeutic exercise;Balance training;Neuromuscular re-education;Patient/family education;Manual techniques;Passive range of motion;Dry needling;Taping;Spinal Manipulations    PT Next Visit Plan review HEP update; progress lumbopelvic flexibilty/ROM as tolerated; progress core/lumbopelvic & postural strengthening; HEP update/modifcation as indicated    PT Home Exercise Plan Access Code: [email or web address] (8/25, updated 8/31 & 9/30)    Consulted and Agree with Plan of Care Patient             Patient will benefit from skilled therapeutic intervention in order to improve the following deficits and impairments:  Decreased activity tolerance, Decreased knowledge of precautions, Decreased mobility,  Decreased range of motion, Decreased strength, Hypomobility, Increased fascial restricitons, Increased muscle spasms, Impaired perceived functional ability, Impaired flexibility, Improper body mechanics, Postural dysfunction, Pain  Visit Diagnosis: Radiculopathy, lumbar region  Acute left-sided low back pain without sciatica  Muscle weakness (generalized)  Other symptoms and signs involving the musculoskeletal system     Problem  List Patient Active Problem List   Diagnosis Date Noted   Allergic rhinitis 11/10/2019   Asthma, chronic 09/28/2013   HBP (high blood pressure) 09/28/2013    Percival Spanish, PT 04/13/2021, 9:48 AM  [facility] [address]  [place], [place], [address] Phone: [phone]   Fax:  [phone]  Name: Denise Gonzales MRN: [id number] Date of Birth: January 19, 1946

## 2021-04-13 NOTE — Patient Instructions (Signed)
    Access Code: [email or web address] URL: [email or web address] Date: 04/13/2021 Prepared by: Annie Paras  Exercises Supine Piriformis Stretch with Foot on Ground - 2-3 x daily - 7 x weekly - 3 reps - 30 sec hold Hip Flexor Stretch at Edge of Bed - 2-3 x daily - 7 x weekly - 3 reps - 30 sec hold Seated Hamstring Stretch - 2-3 x daily - 7 x weekly - 3 reps - 30 sec hold Supine Transversus Abdominis Bracing - Hands on Stomach - 2-3 x daily - 7 x weekly - 2 sets - 10 reps - 5 sec hold Supine March with Resistance Band - 1 x daily - 7 x weekly - 2 sets - 10 reps - 2-3 sec hold hold Hooklying Isometric Clamshell - 1 x daily - 7 x weekly - 2 sets - 10 reps - 3 sec hold Supine Bridge with Resistance Band - 1 x daily - 7 x weekly - 2 sets - 10 reps - 5 sec hold Clam with Resistance - 1 x daily - 7 x weekly - 2 sets - 10 reps - 3-5 sec hold Standing Bilateral Low Shoulder Row with Anchored Resistance - 1 x daily - 7 x weekly - 2 sets - 10 reps - 5 sec hold Shoulder Extension with Resistance - 1 x daily - 7 x weekly - 2 sets - 10 reps - 3 sec hold

## 2021-04-16 ENCOUNTER — Other Ambulatory Visit: Payer: Self-pay

## 2021-04-16 ENCOUNTER — Ambulatory Visit: Payer: PPO | Attending: Physical Medicine and Rehabilitation

## 2021-04-16 DIAGNOSIS — M6281 Muscle weakness (generalized): Secondary | ICD-10-CM | POA: Insufficient documentation

## 2021-04-16 DIAGNOSIS — R29898 Other symptoms and signs involving the musculoskeletal system: Secondary | ICD-10-CM | POA: Diagnosis not present

## 2021-04-16 DIAGNOSIS — M545 Low back pain, unspecified: Secondary | ICD-10-CM | POA: Insufficient documentation

## 2021-04-16 DIAGNOSIS — M5416 Radiculopathy, lumbar region: Secondary | ICD-10-CM | POA: Diagnosis not present

## 2021-04-16 NOTE — Therapy (Signed)
[facility] [address]  [place], [place], [address] Phone: [phone]   Fax:  [phone]  Physical Therapy Treatment  Patient Details  Name: Denise Gonzales MRN: [id number] Date of Birth: 05-16-1946 Referring Provider (PT): Laroy Apple, MD   Encounter Date: 04/16/2021   PT End of Session - 04/16/21 1015     Visit Number 7    Number of Visits 12    Date for PT Re-Evaluation 04/19/21    Authorization Type [employer]    PT Start Time [id number]   pt late   PT Stop Time 1013    PT Time Calculation (min) 36 min    Activity Tolerance Patient tolerated treatment well    Behavior During Therapy [facility] for tasks assessed/performed             Past Medical History:  Diagnosis Date   Asthma    DJD (degenerative joint disease)    GERD (gastroesophageal reflux disease)    History of PSVT (paroxysmal supraventricular tachycardia)    Hyperlipidemia    Hypertension    Osteopenia     Past Surgical History:  Procedure Laterality Date   BACK SURGERY  1990 and 2004   BREAST EXCISIONAL BIOPSY     BREAST LUMPECTOMY     CERVICAL POLYPECTOMY      There were no vitals filed for this visit.   Subjective Assessment - 04/16/21 0940     Subjective Pt feels like she is ready to transition to HEP, reports no back pain today.    Patient Stated Goals "to almost be where I used to be before"    Currently in Pain? No/denies                               [facility] Adult PT Treatment/Exercise - 04/16/21 0001       Lumbar Exercises: Stretches   Piriformis Stretch Limitations reviewed      Lumbar Exercises: Aerobic   Nustep L6 x 5 min (UE/LE)      Lumbar Exercises: Standing   Row Strengthening;Both;10 reps;Theraband    Theraband Level (Row) Level 2 (Red)    Shoulder Extension Strengthening;Both;10 reps;Theraband    Theraband Level (Shoulder Extension) Level 2 (Red)      Lumbar Exercises: Supine   Clam 10  reps;3 seconds    Clam Limitations TrA + red TB alt hip ABD/ER    Bent Knee Raise 5 reps;5 seconds    Bent Knee Raise Limitations brace marching with red TB at knees    Bridge 10 reps;3 seconds    Bridge Limitations + red TB hip ABD isometric      Lumbar Exercises: Sidelying   Clam Right;Left;10 reps;3 seconds    Clam Limitations red TB                     PT Education - 04/16/21 1015     Education Details issued posture and body mechanics handout, reviewed HEP.    Person(s) Educated Patient    Methods Explanation;Demonstration;Handout;Verbal cues    Comprehension Verbalized understanding;Returned demonstration;Verbal cues required              PT Short Term Goals - 04/03/21 1418       PT SHORT TERM GOAL #1   Title Patient will be independent with initial HEP    Status Achieved   03/27/21  PT Long Term Goals - 04/11/21 7588       PT LONG TERM GOAL #1   Title Patient will be independent with ongoing/advanced HEP for self-management at home    Status Partially Met      PT LONG TERM GOAL #2   Title Patient will verbalize/demonstrate good awareness of neutral spine posture and proper body mechanics for daily tasks    Status On-going      PT LONG TERM GOAL #3   Title Patient will demonstrate improved B proximal LE strength to >/= 4 to 4+/5 for improved stability and ease of mobility    Status Partially Met   L hip add limited and B extension     PT LONG TERM GOAL #4   Title Patient to report ability to perform ADLs, household, and leisure activities without limitation due to LBP, L LE pain or weakness    Status On-going                   Plan - 04/16/21 1016     Clinical Impression Statement Pt reported that she was ready to transition to HEP prior to the start of today's session. We reviewed her HEP to ensure proper performance at home and progressed scap stab exercises with red TB. Also issued a posture and body mechanics  handout to help promote more neutral alignment of the spine during household activities. Next session she anticipates being put on a 30 day hold and will need a final goal assessment.    Personal Factors and Comorbidities Time since onset of injury/illness/exacerbation;Past/Current Experience;Age;Fitness;Comorbidity 3+    Comorbidities OA/DJD, back surgery x 2, osteopenia, HTN, HLD, SVT, GERD    Rehab Potential Good    PT Frequency 2x / week    PT Duration 6 weeks    PT Treatment/Interventions ADLs/Self Care Home Management;Cryotherapy;Electrical Stimulation;Iontophoresis 62m/ml Dexamethasone;Moist Heat;Ultrasound;Gait training;Functional mobility training;Therapeutic activities;Therapeutic exercise;Balance training;Neuromuscular re-education;Patient/family education;Manual techniques;Passive range of motion;Dry needling;Taping;Spinal Manipulations    PT Next Visit Plan goal assessment; 30 day hold?, HEP review as needed    PT Home Exercise Plan Access Code: [email or web address] (8/25, updated 8/31 & 9/30)    Consulted and Agree with Plan of Care Patient             Patient will benefit from skilled therapeutic intervention in order to improve the following deficits and impairments:  Decreased activity tolerance, Decreased knowledge of precautions, Decreased mobility, Decreased range of motion, Decreased strength, Hypomobility, Increased fascial restricitons, Increased muscle spasms, Impaired perceived functional ability, Impaired flexibility, Improper body mechanics, Postural dysfunction, Pain  Visit Diagnosis: Radiculopathy, lumbar region  Acute left-sided low back pain without sciatica  Muscle weakness (generalized)  Other symptoms and signs involving the musculoskeletal system     Problem List Patient Active Problem List   Diagnosis Date Noted   Allergic rhinitis 11/10/2019   Asthma, chronic 09/28/2013   HBP (high blood pressure) 09/28/2013    BArtist Pais PTA 04/16/2021, 10:49  AM  [facility] [place] [address]Phone: [phone]  Fax:  [phone] Name: Denise HUNDALMRN: [id number] of Birth: 9August 15, 1947

## 2021-04-19 ENCOUNTER — Ambulatory Visit: Payer: PPO | Admitting: Physical Therapy

## 2021-04-19 DIAGNOSIS — Z23 Encounter for immunization: Secondary | ICD-10-CM | POA: Diagnosis not present

## 2021-04-19 DIAGNOSIS — M5416 Radiculopathy, lumbar region: Secondary | ICD-10-CM | POA: Diagnosis not present

## 2021-04-19 DIAGNOSIS — R002 Palpitations: Secondary | ICD-10-CM | POA: Diagnosis not present

## 2021-04-19 DIAGNOSIS — Z9889 Other specified postprocedural states: Secondary | ICD-10-CM | POA: Diagnosis not present

## 2021-04-19 DIAGNOSIS — M5430 Sciatica, unspecified side: Secondary | ICD-10-CM | POA: Diagnosis not present

## 2021-04-24 ENCOUNTER — Encounter: Payer: Self-pay | Admitting: Physical Therapy

## 2021-04-24 ENCOUNTER — Other Ambulatory Visit: Payer: Self-pay

## 2021-04-24 ENCOUNTER — Ambulatory Visit: Payer: PPO | Admitting: Physical Therapy

## 2021-04-24 DIAGNOSIS — M5416 Radiculopathy, lumbar region: Secondary | ICD-10-CM

## 2021-04-24 DIAGNOSIS — M545 Low back pain, unspecified: Secondary | ICD-10-CM

## 2021-04-24 DIAGNOSIS — R29898 Other symptoms and signs involving the musculoskeletal system: Secondary | ICD-10-CM

## 2021-04-24 DIAGNOSIS — M6281 Muscle weakness (generalized): Secondary | ICD-10-CM

## 2021-04-24 NOTE — Therapy (Addendum)
[facility] [address]  [place], [place], [address] Phone: [phone]   Fax:  [phone]  Physical Therapy Treatment / Progress Note / Discharge Summary  Patient Details  Name: Denise Gonzales MRN: [id number] Date of Birth: [birth year] Referring Provider (PT): Laroy Apple, MD  Progress Note  Reporting Period 03/08/2021 to 04/24/2021  See note below for Objective Data and Assessment of Progress/Goals.     Encounter Date: 04/24/2021   PT End of Session - 04/24/21 1458     Visit Number 8    Number of Visits 12    Date for PT Re-Evaluation 04/19/21    Authorization Type [employer]    PT Start Time 1458   Pt arrived late   PT Stop Time 1532    PT Time Calculation (min) 34 min    Activity Tolerance Patient tolerated treatment well    Behavior During Therapy WFL for tasks assessed/performed             Past Medical History:  Diagnosis Date   Asthma    DJD (degenerative joint disease)    GERD (gastroesophageal reflux disease)    History of PSVT (paroxysmal supraventricular tachycardia)    Hyperlipidemia    Hypertension    Osteopenia     Past Surgical History:  Procedure Laterality Date   BACK SURGERY  1990 and 2004   BREAST EXCISIONAL BIOPSY     BREAST LUMPECTOMY     CERVICAL POLYPECTOMY      There were no vitals filed for this visit.   Subjective Assessment - 04/24/21 1500     Subjective Pt thinks she would like to try transitioning to her HEP with the 30-day hold.    Patient Stated Goals "to almost be where I used to be before"    Currently in Pain? No/denies                [facility] PT Assessment - 04/24/21 1458       Assessment   Medical Diagnosis Lumbar radiculopathy    Referring Provider (PT) Laroy Apple, MD      Observation/Other Assessments   Focus on Therapeutic Outcomes (FOTO)  Lumbar spine = 70   14 point improvement from eval exceeding predicted FS of 66      Strength   Right Hip Flexion 5/5    Right Hip Extension 4+/5    Right Hip External Rotation  4+/5    Right Hip Internal Rotation 4+/5    Right Hip ABduction 5/5    Right Hip ADduction 5/5    Left Hip Flexion 5/5    Left Hip Extension 4+/5    Left Hip External Rotation 4+/5    Left Hip Internal Rotation 4+/5    Left Hip ABduction 5/5    Left Hip ADduction 4+/5    Right Knee Flexion 5/5    Right Knee Extension 5/5    Left Knee Flexion 5/5    Left Knee Extension 5/5    Right Ankle Dorsiflexion 5/5    Left Ankle Dorsiflexion 5/5                           [facility] Adult PT Treatment/Exercise - 04/24/21 1458       Self-Care   Self-Care Posture    Posture Review posture and body mechanics for typical daily tasks and household activities.      Exercises   Exercises Lumbar  Lumbar Exercises: Aerobic   Nustep L5 x 5 min (UE/LE)                     PT Education - 04/24/21 1530     Education Details Review of posture & body mechanics    Person(s) Educated Patient    Methods Explanation;Demonstration;Verbal cues;Handout    Comprehension Verbalized understanding              PT Short Term Goals - 04/03/21 1418       PT SHORT TERM GOAL #1   Title Patient will be independent with initial HEP    Status Achieved   03/27/21              PT Long Term Goals - 04/24/21 1502       PT LONG TERM GOAL #1   Title Patient will be independent with ongoing/advanced HEP for self-management at home    Status Achieved   04/24/21     PT LONG TERM GOAL #2   Title Patient will verbalize/demonstrate good awareness of neutral spine posture and proper body mechanics for daily tasks    Status Achieved   04/24/21     PT LONG TERM GOAL #3   Title Patient will demonstrate improved B proximal LE strength to >/= 4 to 4+/5 for improved stability and ease of mobility    Status Achieved   04/24/21     PT LONG TERM GOAL #4   Title Patient to report ability to  perform ADLs, household, and leisure activities without limitation due to LBP, L LE pain or weakness    Status Achieved   04/24/21                  Plan - 04/24/21 1531     Clinical Impression Statement Denise Gonzales is very pleased with her progress noting no recent LBP or lumbar radiculopathy as well as improving strength and activity tolerance - she even notes increased ease in keeping up with her grandson. She reports good awareness of posture and body mechanics and acknowledged good understanding of the posture and body mechanics information reviewed today. Her overall LE strength has improved with MMT 4+/5 to 5/5 t/o B LEs. She is able to complete all of her normal daily tasks w/o limitation due to LBP or LE radiculopathy while keeping good awareness of her posture and body mechanics. All goals met for this episode and FOTO improved by 14 points to 70 exceeding predicted discharge value of 66. Denise Gonzales feels ready to transition to her HEP but would like to remain on hold for 30 days in the event that issues arise with her home program that would necessitate return to PT.    Comorbidities OA/DJD, back surgery x 2, osteopenia, HTN, HLD, SVT, GERD    Rehab Potential Good    PT Treatment/Interventions ADLs/Self Care Home Management;Cryotherapy;Electrical Stimulation;Iontophoresis 48m/ml Dexamethasone;Moist Heat;Ultrasound;Gait training;Functional mobility training;Therapeutic activities;Therapeutic exercise;Balance training;Neuromuscular re-education;Patient/family education;Manual techniques;Passive range of motion;Dry needling;Taping;Spinal Manipulations    PT Next Visit Plan transition to HEP + 30-day hold    PT Home Exercise Plan Access Code: [email or web address] (8/25, updated 8/31 & 9/30)    Consulted and Agree with Plan of Care Patient             Patient will benefit from skilled therapeutic intervention in order to improve the following deficits and impairments:  Decreased activity tolerance, Decreased  knowledge of precautions, Decreased mobility, Decreased range of motion, Decreased strength, Hypomobility, Increased  fascial restricitons, Increased muscle spasms, Impaired perceived functional ability, Impaired flexibility, Improper body mechanics, Postural dysfunction, Pain  Visit Diagnosis: Radiculopathy, lumbar region  Acute left-sided low back pain without sciatica  Muscle weakness (generalized)  Other symptoms and signs involving the musculoskeletal system     Problem List Patient Active Problem List   Diagnosis Date Noted   Allergic rhinitis 11/10/2019   Asthma, chronic 09/28/2013   HBP (high blood pressure) 09/28/2013    Percival Spanish, PT 04/24/2021, 3:46 PM  [facility] [address]  [place], [place], [address] Phone: [phone]   Fax:  [phone]  Name: Denise Gonzales MRN: [id number] Date of Birth: 1946-05-06   PHYSICAL THERAPY DISCHARGE SUMMARY  Visits from Start of Care: 8  Current functional level related to goals / functional outcomes:   Refer to above clinical impression for status as of last visit on 04/24/2021. Patient was placed on hold for 30 days and has not needed to return to PT, therefore will proceed with discharge from PT for this episode.   Remaining deficits:   As above.   Education / Equipment:   HEP   Patient agrees to discharge. Patient goals were met. Patient is being discharged due to meeting the stated rehab goals.  Percival Spanish, PT, MPT 06/04/21, 1:50 PM  [facility] [address]  [place], [place], [address] Phone: [phone]   Fax:  [phone]

## 2021-04-24 NOTE — Patient Instructions (Signed)

## 2021-04-25 DIAGNOSIS — R002 Palpitations: Secondary | ICD-10-CM | POA: Diagnosis not present

## 2021-05-09 DIAGNOSIS — R002 Palpitations: Secondary | ICD-10-CM | POA: Diagnosis not present

## 2021-05-09 DIAGNOSIS — M5416 Radiculopathy, lumbar region: Secondary | ICD-10-CM | POA: Diagnosis not present

## 2021-05-09 DIAGNOSIS — Z9889 Other specified postprocedural states: Secondary | ICD-10-CM | POA: Diagnosis not present

## 2021-05-09 DIAGNOSIS — M5430 Sciatica, unspecified side: Secondary | ICD-10-CM | POA: Diagnosis not present

## 2021-05-14 DIAGNOSIS — H40013 Open angle with borderline findings, low risk, bilateral: Secondary | ICD-10-CM | POA: Diagnosis not present

## 2021-05-14 DIAGNOSIS — H40033 Anatomical narrow angle, bilateral: Secondary | ICD-10-CM | POA: Diagnosis not present

## 2021-05-21 ENCOUNTER — Other Ambulatory Visit: Payer: Self-pay

## 2021-05-21 ENCOUNTER — Encounter (HOSPITAL_BASED_OUTPATIENT_CLINIC_OR_DEPARTMENT_OTHER): Payer: Self-pay | Admitting: *Deleted

## 2021-05-21 ENCOUNTER — Emergency Department (HOSPITAL_BASED_OUTPATIENT_CLINIC_OR_DEPARTMENT_OTHER)
Admission: EM | Admit: 2021-05-21 | Discharge: 2021-05-21 | Disposition: A | Discharge status: Home / Self Care | Payer: PPO | Attending: Emergency Medicine | Admitting: Emergency Medicine

## 2021-05-21 DIAGNOSIS — S61051A Open bite of right thumb without damage to nail, initial encounter: Secondary | ICD-10-CM | POA: Diagnosis not present

## 2021-05-21 DIAGNOSIS — Z7951 Long term (current) use of inhaled steroids: Secondary | ICD-10-CM | POA: Insufficient documentation

## 2021-05-21 DIAGNOSIS — S61011A Laceration without foreign body of right thumb without damage to nail, initial encounter: Secondary | ICD-10-CM | POA: Insufficient documentation

## 2021-05-21 DIAGNOSIS — I10 Essential (primary) hypertension: Secondary | ICD-10-CM | POA: Insufficient documentation

## 2021-05-21 DIAGNOSIS — Z23 Encounter for immunization: Secondary | ICD-10-CM | POA: Diagnosis not present

## 2021-05-21 DIAGNOSIS — Z79899 Other long term (current) drug therapy: Secondary | ICD-10-CM | POA: Diagnosis not present

## 2021-05-21 DIAGNOSIS — W540XXA Bitten by dog, initial encounter: Secondary | ICD-10-CM | POA: Diagnosis not present

## 2021-05-21 DIAGNOSIS — J45909 Unspecified asthma, uncomplicated: Secondary | ICD-10-CM | POA: Insufficient documentation

## 2021-05-21 DIAGNOSIS — S6991XA Unspecified injury of right wrist, hand and finger(s), initial encounter: Secondary | ICD-10-CM | POA: Diagnosis present

## 2021-05-21 MED ORDER — AMOXICILLIN-POT CLAVULANATE 875-125 MG PO TABS
1.0000 | ORAL_TABLET | Freq: Two times a day (BID) | ORAL | 0 refills | Status: DC
Start: 2021-05-21 — End: 2022-03-15

## 2021-05-21 MED ORDER — TETANUS-DIPHTH-ACELL PERTUSSIS 5-2.5-18.5 LF-MCG/0.5 IM SUSY
0.5000 mL | PREFILLED_SYRINGE | Freq: Once | INTRAMUSCULAR | Status: AC
  Administered 2021-05-21: 0.5 mL via INTRAMUSCULAR
  Filled 2021-05-21: qty 0.5

## 2021-05-21 MED ORDER — AMOXICILLIN-POT CLAVULANATE 875-125 MG PO TABS
1.0000 | ORAL_TABLET | Freq: Once | ORAL | Status: AC
  Administered 2021-05-21: 1 via ORAL
  Filled 2021-05-21: qty 1

## 2021-05-21 NOTE — Discharge Instructions (Addendum)
Take Augmentin twice daily for a week to prevent infection  Continue to clean the wound with soap and water.  You may apply bacitracin on it.  See your doctor for follow-up  Return to ER if you have fever or purulent drainage or severe pain

## 2021-05-21 NOTE — ED Provider Notes (Signed)
Denise Gonzales   CSN: [id number] Arrival date & time: 05/21/21  1716     History Chief Complaint  Patient presents with   Abrasion    Denise Gonzales is a 75 y.o. female history of hyperlipidemia, hypertension here presenting with dog bite.  She states that she was playing with her dog and the dog tried to grab a toy and accidentally bit her on the right thumb.  She states that the dog is up-to-date with shots and is acting normally.  She states that her tetanus is not up-to-date.  Patient cleaned the wound with peroxide and also applied Neosporin on it  The history is provided by the patient.      Past Medical History:  Diagnosis Date   Asthma    DJD (degenerative joint disease)    GERD (gastroesophageal reflux disease)    History of PSVT (paroxysmal supraventricular tachycardia)    Hyperlipidemia    Hypertension    Osteopenia     Patient Active Problem List   Diagnosis Date Noted   Allergic rhinitis 11/10/2019   Asthma, chronic 09/28/2013   HBP (high blood pressure) 09/28/2013    Past Surgical History:  Procedure Laterality Date   BACK SURGERY  1990 and 2004   BREAST EXCISIONAL BIOPSY     BREAST LUMPECTOMY     CERVICAL POLYPECTOMY       OB History     Gravida  3   Para  3   Term      Preterm      AB      Living  3      SAB      IAB      Ectopic      Multiple      Live Births              Family History  Problem Relation Age of Onset   Asthma Sister    Allergies Sister    Breast cancer Sister    Colon cancer Other        neice at 75 years old   Hypertension Other        multiple family members   Kidney failure Father     Social History   Tobacco Use   Smoking status: Never   Smokeless tobacco: Never  Vaping Use   Vaping Use: Never used  Substance Use Topics   Alcohol use: No   Drug use: No    Home Medications Prior to Admission medications   Medication Sig Start Date End Date  Taking? Authorizing Provider  albuterol (VENTOLIN HFA) 108 (90 Base) MCG/ACT inhaler Inhale 2 puffs into the lungs every 6 (six) hours as needed for wheezing or shortness of breath. 02/12/21   Magdalen Spatz, NP  atorvastatin (LIPITOR) 20 MG tablet Take 1 tablet by mouth daily. 09/02/13   [provider]  b complex vitamins tablet Take 1 tablet by mouth daily.    [provider]  diltiazem (CARDIZEM CD) 240 MG 24 hr capsule Take 240 mg by mouth daily.    [provider]  diltiazem (TIAZAC) 240 MG 24 hr capsule Take 240 mg by mouth daily. 01/04/21   [provider]  fluticasone Asencion Islam) 50 MCG/ACT nasal spray  11/21/16   [provider]  fluticasone-salmeterol (ADVAIR DISKUS) 100-50 MCG/ACT AEPB Inhale 1 puff into the lungs 2 (two) times daily. 02/12/21 02/12/22  Magdalen Spatz, NP  fluticasone-salmeterol (ADVAIR) 100-50 MCG/ACT  AEPB INHALE ONE PUFF BY MOUTH TWICE A DAY 02/02/21   Magdalen Spatz, NP  hydrochlorothiazide (HYDRODIURIL) 12.5 MG tablet Take 12.5 mg by mouth daily.    [provider]  lisinopril (PRINIVIL,ZESTRIL) 40 MG tablet Take 40 mg by mouth daily.    [provider]  loratadine (CLARITIN) 10 MG tablet Take 10 mg by mouth daily.    [provider]  montelukast (SINGULAIR) 10 MG tablet Take 1 tablet by mouth daily. 09/02/13   [provider]  Multiple Vitamins-Minerals (CENTRUM SILVER PO) Take 1 tablet by mouth daily.    [provider]  Multiple Vitamins-Minerals (ICAPS) CAPS Take 1 capsule by mouth daily.    [provider]  Omega-3 Fatty Acids (FISH OIL PO) Take 1 tablet by mouth daily.    [provider]  OVER THE COUNTER MEDICATION Osteospam (supplement for bone health) 2 capsules BID    [provider]  PROAIR HFA 108 (90 BASE) MCG/ACT inhaler Inhale 2 puffs into the lungs every 4 (four) hours as needed. 09/02/13   [provider]    Allergies    Bactrim  [sulfamethoxazole-trimethoprim]  Review of Systems   Review of Systems  Skin:  Positive for wound.  All other systems reviewed and are negative.  Physical Exam Updated Vital Signs BP (!) 163/75 (BP Location: Right Arm)   Pulse 83   Temp 98.2 F (36.8 C) (Oral)   Resp 20   Ht 5\' 3"  (1.6 m)   Wt 52.6 kg   SpO2 97%   BMI 20.55 kg/m   Physical Exam Vitals and nursing Gonzales reviewed.  Constitutional:      Appearance: Normal appearance.  HENT:     Head: Normocephalic.     Nose: Nose normal.     Mouth/Throat:     Mouth: Mucous membranes are moist.  Eyes:     Pupils: Pupils are equal, round, and reactive to light.  Cardiovascular:     Rate and Rhythm: Normal rate.     Pulses: Normal pulses.  Pulmonary:     Effort: Pulmonary effort is normal.  Abdominal:     General: Abdomen is flat.  Musculoskeletal:     Cervical back: Normal range of motion.     Comments: Right thumb laceration over the PIP joint.  The wound is well approximated.  Patient is able to flex and extend the joint.  There is no obvious open fracture, there is no nail injury   Neurological:     General: No focal deficit present.     Mental Status: She is alert.  Psychiatric:        Mood and Affect: Mood normal.        Behavior: Behavior normal.    ED Results / Procedures / Treatments   Labs (all labs ordered are listed, but only abnormal results are displayed) Labs Reviewed - No data to display  EKG None  Radiology No results found.  Procedures Procedures   Medications Ordered in ED Medications  Tdap (BOOSTRIX) injection 0.5 mL (has no administration in time range)  amoxicillin-clavulanate (AUGMENTIN) 875-125 MG per tablet 1 tablet (has no administration in time range)    ED Course  I have reviewed the triage vital signs and the nursing notes.  Pertinent labs & imaging results that were available during my care of the patient were reviewed by me and considered in my medical decision making  (see chart for details).    MDM Rules/Calculators/A&P  Denise Gonzales is a 75 y.o. female here with R thumb injury. Dog bite to the right thumb. Has small laceration that is well approximated. Tdap updated. I told her that its better to leave it open so it won't get infected. Will give augmentin for prophylaxis    Final Clinical Impression(s) / ED Diagnoses Final diagnoses:  None    Rx / DC Orders ED Discharge Orders     None        Drenda Freeze, MD 05/21/21 1916

## 2021-05-21 NOTE — ED Triage Notes (Signed)
Pt c/o abrasion by her dogs tooth on her right thumb while playing

## 2021-05-23 DIAGNOSIS — M5430 Sciatica, unspecified side: Secondary | ICD-10-CM | POA: Diagnosis not present

## 2021-05-23 DIAGNOSIS — R002 Palpitations: Secondary | ICD-10-CM | POA: Diagnosis not present

## 2021-05-23 DIAGNOSIS — Z9889 Other specified postprocedural states: Secondary | ICD-10-CM | POA: Diagnosis not present

## 2021-05-23 DIAGNOSIS — M5416 Radiculopathy, lumbar region: Secondary | ICD-10-CM | POA: Diagnosis not present

## 2021-06-13 DIAGNOSIS — J018 Other acute sinusitis: Secondary | ICD-10-CM | POA: Diagnosis not present

## 2021-09-03 ENCOUNTER — Other Ambulatory Visit: Payer: Self-pay | Admitting: Obstetrics & Gynecology

## 2021-09-03 DIAGNOSIS — Z1231 Encounter for screening mammogram for malignant neoplasm of breast: Secondary | ICD-10-CM

## 2021-09-28 ENCOUNTER — Ambulatory Visit
Admission: RE | Admit: 2021-09-28 | Discharge: 2021-09-28 | Disposition: A | Discharge status: Home / Self Care | Payer: PPO | Source: Ambulatory Visit | Attending: Obstetrics & Gynecology | Admitting: Obstetrics & Gynecology

## 2021-09-28 ENCOUNTER — Ambulatory Visit: Payer: PPO

## 2021-09-28 DIAGNOSIS — Z1231 Encounter for screening mammogram for malignant neoplasm of breast: Secondary | ICD-10-CM

## 2021-10-20 ENCOUNTER — Emergency Department (HOSPITAL_BASED_OUTPATIENT_CLINIC_OR_DEPARTMENT_OTHER): Payer: PPO

## 2021-10-20 ENCOUNTER — Encounter (HOSPITAL_BASED_OUTPATIENT_CLINIC_OR_DEPARTMENT_OTHER): Payer: Self-pay

## 2021-10-20 ENCOUNTER — Emergency Department (HOSPITAL_BASED_OUTPATIENT_CLINIC_OR_DEPARTMENT_OTHER)
Admission: EM | Admit: 2021-10-20 | Discharge: 2021-10-20 | Disposition: A | Discharge status: Home / Self Care | Payer: PPO | Attending: Emergency Medicine | Admitting: Emergency Medicine

## 2021-10-20 ENCOUNTER — Other Ambulatory Visit: Payer: Self-pay

## 2021-10-20 DIAGNOSIS — X58XXXA Exposure to other specified factors, initial encounter: Secondary | ICD-10-CM | POA: Diagnosis not present

## 2021-10-20 DIAGNOSIS — S199XXA Unspecified injury of neck, initial encounter: Secondary | ICD-10-CM | POA: Diagnosis present

## 2021-10-20 DIAGNOSIS — M25512 Pain in left shoulder: Secondary | ICD-10-CM | POA: Insufficient documentation

## 2021-10-20 DIAGNOSIS — S161XXA Strain of muscle, fascia and tendon at neck level, initial encounter: Secondary | ICD-10-CM | POA: Insufficient documentation

## 2021-10-20 NOTE — ED Triage Notes (Signed)
Pt c/o left lateral neck pain & shoulder/arm pain & ear pain, states tightness/pain with moving neck this morning. Took 2 advil earlier today with some relief.  ?

## 2021-10-20 NOTE — Discharge Instructions (Signed)
You were seen in the emergency department for some left-sided neck and shoulder pain.  You had x-rays that did not show any fracture or dislocation.  Please continue ibuprofen and you can do warm compress to the area.  Gentle stretching.  Follow-up with your primary care doctor.  Return to the emergency department if any worsening or concerning symptoms ?

## 2021-10-20 NOTE — ED Provider Notes (Signed)
?[facility] EMERGENCY DEPARTMENT ?Provider Note ? ? ?CSN: [id number] ?Arrival date & time: 10/20/21  1456 ? ?  ? ?History ? ?Chief Complaint  ?Patient presents with  ? Neck Pain  ? ? ?Denise Gonzales is a 76 y.o. female.  She is here with a complaint of left-sided neck and shoulder pain that started this morning around 11 AM.  Worse with movement.  Radiated down her arm on the left.  Took some ibuprofen with some improvement.  No prior history of same.  No known trauma.  No numbness or weakness.  No difficulty swallowing ? ?The history is provided by the patient.  ?Neck Pain ?Pain location:  L side ?Quality:  Aching ?Pain radiates to:  L shoulder and L arm ?Pain severity:  Severe ?Pain is:  Same all the time ?Onset quality:  Gradual ?Duration:  5 hours ?Timing:  Constant ?Progression:  Improving ?Chronicity:  New ?Context: not fall   ?Relieved by:  Nothing ?Worsened by:  Position ?Ineffective treatments:  NSAIDs ?Associated symptoms: no fever, no headaches, no numbness, no visual change and no weakness   ? ?  ? ?Home Medications ?Prior to Admission medications   ?Medication Sig Start Date End Date Taking? Authorizing Provider  ?albuterol (VENTOLIN HFA) 108 (90 Base) MCG/ACT inhaler Inhale 2 puffs into the lungs every 6 (six) hours as needed for wheezing or shortness of breath. 02/12/21   Magdalen Spatz, NP  ?amoxicillin-clavulanate (AUGMENTIN) 875-125 MG tablet Take 1 tablet by mouth 2 (two) times daily. One po bid x 7 days 05/21/21   Drenda Freeze, MD  ?atorvastatin (LIPITOR) 20 MG tablet Take 1 tablet by mouth daily. 09/02/13   [provider]  ?b complex vitamins tablet Take 1 tablet by mouth daily.    [provider]  ?diltiazem (CARDIZEM CD) 240 MG 24 hr capsule Take 240 mg by mouth daily.    [provider]  ?diltiazem (TIAZAC) 240 MG 24 hr capsule Take 240 mg by mouth daily. 01/04/21   [provider]  ?fluticasone Asencion Islam) 50 MCG/ACT nasal spray  11/21/16   [provider]  ?fluticasone-salmeterol (ADVAIR DISKUS) 100-50 MCG/ACT AEPB Inhale 1 puff into the lungs 2 (two) times daily. 02/12/21 02/12/22  Magdalen Spatz, NP  ?fluticasone-salmeterol (ADVAIR) 100-50 MCG/ACT AEPB INHALE ONE PUFF BY MOUTH TWICE A DAY 02/02/21   Magdalen Spatz, NP  ?hydrochlorothiazide (HYDRODIURIL) 12.5 MG tablet Take 12.5 mg by mouth daily.    [provider]  ?lisinopril (PRINIVIL,ZESTRIL) 40 MG tablet Take 40 mg by mouth daily.    [provider]  ?loratadine (CLARITIN) 10 MG tablet Take 10 mg by mouth daily.    [provider]  ?montelukast (SINGULAIR) 10 MG tablet Take 1 tablet by mouth daily. 09/02/13   [provider]  ?Multiple Vitamins-Minerals (CENTRUM SILVER PO) Take 1 tablet by mouth daily.    [provider]  ?Multiple Vitamins-Minerals (ICAPS) CAPS Take 1 capsule by mouth daily.    [provider]  ?Omega-3 Fatty Acids (FISH OIL PO) Take 1 tablet by mouth daily.    [provider]  ?OVER THE COUNTER MEDICATION Osteospam (supplement for bone health) 2 capsules BID    [provider]  ?PROAIR HFA 108 (90 BASE) MCG/ACT inhaler Inhale 2 puffs into the lungs every 4 (four) hours as needed. 09/02/13   [provider]  ?   ? ?Allergies    ?Bactrim [sulfamethoxazole-trimethoprim]   ? ?Review of Systems   ?  Review of Systems  ?Constitutional:  Negative for fever.  ?HENT:  Negative for sore throat.   ?Gastrointestinal:  Negative for nausea and vomiting.  ?Musculoskeletal:  Positive for neck pain.  ?Skin:  Negative for rash.  ?Neurological:  Negative for weakness, numbness and headaches.  ? ?Physical Exam ?Updated Vital Signs ?BP (!) 148/67 (BP Location: Right Arm)   Pulse 76   Temp 98 ?F (36.7 ?C) (Oral)   Resp 18   Ht '5\' 3"'$  (1.6 m)   Wt 52.6 kg   SpO2 97%   BMI 20.55 kg/m?  ?Physical Exam ?Vitals and nursing note reviewed.  ?Constitutional:   ?   General: She is not in acute distress. ?   Appearance: Normal  appearance. She is well-developed.  ?HENT:  ?   Head: Normocephalic and atraumatic.  ?Eyes:  ?   Conjunctiva/sclera: Conjunctivae normal.  ?Neck:  ?   Comments: There is no midline C-spine tenderness.  She is tender paracervical lateral neck into left trapezius.  Shoulder full range of motion without any limitations. ?Cardiovascular:  ?   Rate and Rhythm: Normal rate and regular rhythm.  ?   Heart sounds: No murmur heard. ?Pulmonary:  ?   Effort: Pulmonary effort is normal. No respiratory distress.  ?   Breath sounds: Normal breath sounds.  ?Abdominal:  ?   Palpations: Abdomen is soft.  ?   Tenderness: There is no abdominal tenderness.  ?Musculoskeletal:     ?   General: No swelling or tenderness. Normal range of motion.  ?   Cervical back: Neck supple.  ?Skin: ?   General: Skin is warm and dry.  ?   Capillary Refill: Capillary refill takes less than 2 seconds.  ?Neurological:  ?   Mental Status: She is alert.  ?   Sensory: No sensory deficit.  ?   Motor: No weakness.  ?Psychiatric:     ?   Mood and Affect: Mood normal.  ? ? ?ED Results / Procedures / Treatments   ?Labs ?(all labs ordered are listed, but only abnormal results are displayed) ?Labs Reviewed - No data to display ? ?EKG ?EKG Interpretation ? ?Date/Time:  Saturday October 20 2021 15:21:20 EDT ?Ventricular Rate:  70 ?PR Interval:  240 ?QRS Duration: 66 ?QT Interval:  396 ?QTC Calculation: 427 ?R Axis:   23 ?Text Interpretation: Sinus rhythm with 1st degree A-V block Possible Left atrial enlargement Low voltage QRS Cannot rule out Anterior infarct , age undetermined Abnormal ECG When compared with ECG of 27-Dec-2019 01:11, No significant change since last tracing Confirmed by Aletta Edouard [phone]) on 10/20/2021 4:08:28 PM ? ?Radiology ?DG Cervical Spine Complete ? ?Result Date: 10/20/2021 ?CLINICAL DATA:  left lateral neck pain & shoulder/arm pain & ear pain, states tightness/pain with moving neck this morning. NKI EXAM: CERVICAL SPINE - COMPLETE 4+ VIEW  COMPARISON:  None. FINDINGS: No fracture.  No bone lesion. Straightening of the mid to lower cervical spine lordosis. No spondylolisthesis. Mild loss of disc height at C4-C5. Moderate loss of disc height at C5-C6. Mild loss of disc height at C6-C7. Suboptimal obliquity on the oblique views limits assessment of the neural foramina. Skeletal structures are demineralized. Normal soft tissues. IMPRESSION: 1. No fracture or acute finding. 2. No spondylolisthesis. 3. Disc degenerative changes from C4-C5 through C6-C7 as described. Electronically Signed   By: Lajean Manes M.D.   On: 10/20/2021 17:11  ? ?DG Shoulder Left ? ?Result Date: 10/20/2021 ?CLINICAL DATA:  left lateral neck pain &  shoulder/arm pain & ear pain, states tightness/pain with moving neck this morning. NKI EXAM: LEFT SHOULDER - 2+ VIEW COMPARISON:  None. FINDINGS: No fracture or acute finding. Glenohumeral and AC joints are normally spaced and aligned. No significant degenerative/arthropathic changes. Skeletal structures are demineralized. Normal soft tissues. IMPRESSION: No fracture, bone lesion or joint abnormality. Electronically Signed   By: Lajean Manes M.D.   On: 10/20/2021 17:12   ? ?Procedures ?Procedures  ? ? ?Medications Ordered in ED ?Medications - No data to display ? ?ED Course/ Medical Decision Making/ A&P ?  ?                        ?Medical Decision Making ?Amount and/or Complexity of Data Reviewed ?Radiology: ordered. ? ?Differential diagnosis includes musculoskeletal pain, radiculopathy, tumor, fracture.  X-rays ordered and interpreted by me as no acute fracture no dislocation.  No evidence of acute neurologic condition.  Will treat symptomatically with otc nsaids.  Recommended close follow-up with PCP.  Return instructions discussed ? ? ? ? ? ? ? ? ?Final Clinical Impression(s) / ED Diagnoses ?Final diagnoses:  ?Acute strain of neck muscle, initial encounter  ? ? ?Rx / DC Orders ?ED Discharge Orders   ? ? None  ? ?  ? ? ?  ?Hayden Rasmussen, MD ?10/21/21 [phone] ? ?

## 2022-03-02 ENCOUNTER — Other Ambulatory Visit: Payer: Self-pay | Admitting: Acute Care

## 2022-03-02 DIAGNOSIS — J45909 Unspecified asthma, uncomplicated: Secondary | ICD-10-CM

## 2022-03-15 ENCOUNTER — Ambulatory Visit (INDEPENDENT_AMBULATORY_CARE_PROVIDER_SITE_OTHER): Payer: PPO | Admitting: Adult Health

## 2022-03-15 ENCOUNTER — Encounter: Payer: Self-pay | Admitting: Adult Health

## 2022-03-15 DIAGNOSIS — J4530 Mild persistent asthma, uncomplicated: Secondary | ICD-10-CM | POA: Diagnosis not present

## 2022-03-15 DIAGNOSIS — J309 Allergic rhinitis, unspecified: Secondary | ICD-10-CM

## 2022-03-15 MED ORDER — ALBUTEROL SULFATE HFA 108 (90 BASE) MCG/ACT IN AERS: 2.0000 | INHALATION_SPRAY | RESPIRATORY_TRACT | 1 refills | Status: DC | PRN

## 2022-03-15 MED ORDER — FLUTICASONE-SALMETEROL 100-50 MCG/ACT IN AEPB: INHALATION_SPRAY | RESPIRATORY_TRACT | 11 refills | Status: DC

## 2022-03-15 NOTE — Assessment & Plan Note (Signed)
Continue on current maintenance regimen.  Trigger prevention.  Plan  Patient Instructions  Continue on Advair 1 puff .Twice daily  , rinse after use.  Continue on Singulair , Flonase, Claritin .  Activity as tolerated.  Albuterol Inhaler As needed  Wheezing .  Follow up with Dr. Elsworth Soho  or Aislinn Feliz NP in 1 year and As needed

## 2022-03-15 NOTE — Patient Instructions (Signed)
Continue on Advair 1 puff .Twice daily  , rinse after use.  Continue on Singulair , Flonase, Claritin .  Activity as tolerated.  Albuterol Inhaler As needed  Wheezing .  Follow up with Dr. Elsworth Soho  or Ragina Fenter NP in 1 year and As needed

## 2022-03-15 NOTE — Addendum Note (Signed)
Addended by: Vanessa Barbara on: 03/15/2022 02:49 PM   Modules accepted: Orders

## 2022-03-15 NOTE — Progress Notes (Signed)
$'@Patient'a$  ID: Denise Gonzales, female    DOB: [birth year], 76 y.o.   MRN: [id number]  Chief Complaint  Patient presents with   Follow-up    Referring provider: Gwendel Hanson  HPI: 76 year old female never smoker followed for asthma and allergic rhinitis From [place]. Bilingual.   TEST/EVENTS :  02/2014 Spirometry again showed moderate airway obstruction with FEV1 of 1.39-64%  01/2015 FEV1 63%   11/2016 FEV1 64%, ratio 70   12/2017 FEV1 64%  03/15/2022 Follow up ; Asthma and Allergic Rhinitis  Patient presents for 1 year follow-up.  Patient has underlying  persistent asthma.  She remains on Advair twice daily.  Singulair and Claritin daily.  Uses Flonase as needed.  She denies any increased albuterol use.  Says overall breathing is doing well.  She has had no flare of cough or wheezing.  Activity level is remained at baseline.  She feels that her asthma is doing well overall. Says she remains active at home. Drives and does all house work. Says she likes to stay busy.  Getting Flu and pneumonia vaccine shot next month .  On ACE inhibitor . Denies cough.     Allergies  Allergen Reactions   Bactrim [Sulfamethoxazole-Trimethoprim] Other (See Comments)    Pt felt like she was having a heart attack.     Immunization History  Administered Date(s) Administered   Influenza Split 04/14/2013   Influenza Whole 04/26/2019   Tdap 05/21/2021    Past Medical History:  Diagnosis Date   Asthma    DJD (degenerative joint disease)    GERD (gastroesophageal reflux disease)    History of PSVT (paroxysmal supraventricular tachycardia)    Hyperlipidemia    Hypertension    Osteopenia     Tobacco History: Social History   Tobacco Use  Smoking Status Never  Smokeless Tobacco Never   Counseling given: Not Answered   Outpatient Medications Prior to Visit  Medication Sig Dispense Refill   atorvastatin (LIPITOR) 20 MG tablet Take 1 tablet by mouth daily.     b complex vitamins  tablet Take 1 tablet by mouth daily.     diltiazem (CARDIZEM CD) 240 MG 24 hr capsule Take 240 mg by mouth daily.     fluticasone (FLONASE) 50 MCG/ACT nasal spray      fluticasone-salmeterol (ADVAIR) 100-50 MCG/ACT AEPB INHALE ONE PUFF BY MOUTH TWICE A DAY 60 each 0   hydrochlorothiazide (HYDRODIURIL) 12.5 MG tablet Take 12.5 mg by mouth daily.     lisinopril (PRINIVIL,ZESTRIL) 40 MG tablet Take 40 mg by mouth daily.     loratadine (CLARITIN) 10 MG tablet Take 10 mg by mouth daily.     montelukast (SINGULAIR) 10 MG tablet Take 1 tablet by mouth daily.     Multiple Vitamins-Minerals (CENTRUM SILVER PO) Take 1 tablet by mouth daily.     Omega-3 Fatty Acids (FISH OIL PO) Take 1 tablet by mouth daily.     OVER THE COUNTER MEDICATION Osteospam (supplement for bone health) 2 capsules BID     PROAIR HFA 108 (90 BASE) MCG/ACT inhaler Inhale 2 puffs into the lungs every 4 (four) hours as needed.     albuterol (VENTOLIN HFA) 108 (90 Base) MCG/ACT inhaler Inhale 2 puffs into the lungs every 6 (six) hours as needed for wheezing or shortness of breath. (Patient not taking: Reported on 03/15/2022) 8 g 2   amoxicillin-clavulanate (AUGMENTIN) 875-125 MG tablet Take 1 tablet by mouth 2 (two) times daily. One po bid  x 7 days (Patient not taking: Reported on 03/15/2022) 14 tablet 0   diltiazem (TIAZAC) 240 MG 24 hr capsule Take 240 mg by mouth daily. (Patient not taking: Reported on 03/15/2022)     fluticasone-salmeterol (ADVAIR) 100-50 MCG/ACT AEPB INHALE ONE PUFF BY MOUTH  INTO THE LUNGS TWICE A DAY AS DIRECTED (Patient not taking: Reported on 03/15/2022) 120 each 0   Multiple Vitamins-Minerals (ICAPS) CAPS Take 1 capsule by mouth daily. (Patient not taking: Reported on 03/15/2022)     No facility-administered medications prior to visit.     Review of Systems:   Constitutional:   No  weight loss, night sweats,  Fevers, chills,  +fatigue, or  lassitude.  HEENT:   No headaches,  Difficulty swallowing,  Tooth/dental  problems, or  Sore throat,                No sneezing, itching, ear ache,  +nasal congestion, post nasal drip,   CV:  No chest pain,  Orthopnea, PND, swelling in lower extremities, anasarca, dizziness, palpitations, syncope.   GI  No heartburn, indigestion, abdominal pain, nausea, vomiting, diarrhea, change in bowel habits, loss of appetite, bloody stools.   Resp:   No chest wall deformity  Skin: no rash or lesions.  GU: no dysuria, change in color of urine, no urgency or frequency.  No flank pain, no hematuria   MS:  No joint pain or swelling.  No decreased range of motion.  No back pain.    Physical Exam  BP (!) 140/60 (BP Location: Left Arm, Patient Position: Sitting, Cuff Size: Normal)   Pulse 84   Temp 98.2 F (36.8 C) (Oral)   Ht '5\' 3"'$  (1.6 m)   Wt 117 lb 9.6 oz (53.3 kg)   SpO2 97%   BMI 20.83 kg/m   GEN: A/Ox3; pleasant , NAD, well nourished    HEENT:  [place]/AT,  NOSE-clear, THROAT-clear, no lesions, no postnasal drip or exudate noted.   NECK:  Supple w/ fair ROM; no JVD; normal carotid impulses w/o bruits; no thyromegaly or nodules palpated; no lymphadenopathy.    RESP  Clear  P & A; w/o, wheezes/ rales/ or rhonchi. no accessory muscle use, no dullness to percussion  CARD:  RRR, no m/r/g, no peripheral edema, pulses intact, no cyanosis or clubbing.  GI:   Soft & nt; nml bowel sounds; no organomegaly or masses detected.   Musco: Warm bil, no deformities or joint swelling noted.   Neuro: alert, no focal deficits noted.    Skin: Warm, no lesions or rashes        BNP No results found for: "BNP"  ProBNP No results found for: "PROBNP"  Imaging: No results found.        No data to display          No results found for: "NITRICOXIDE"      Assessment & Plan:   Asthma, chronic Mild persistent asthma under good control Continue with trigger prevention  Plan  Patient Instructions  Continue on Advair 1 puff .Twice daily  , rinse after  use.  Continue on Singulair , Flonase, Claritin .  Activity as tolerated.  Albuterol Inhaler As needed  Wheezing .  Follow up with Dr. Elsworth Soho  or Jessa Stinson NP in 1 year and As needed       Allergic rhinitis Continue on current maintenance regimen.  Trigger prevention.  Plan  Patient Instructions  Continue on Advair 1 puff .Twice daily  , rinse after use.  Continue on Singulair , Flonase, Claritin .  Activity as tolerated.  Albuterol Inhaler As needed  Wheezing .  Follow up with Dr. Elsworth Soho  or Aydan Phoenix NP in 1 year and As needed         Rexene Edison, NP 03/15/2022

## 2022-03-15 NOTE — Assessment & Plan Note (Signed)
Mild persistent asthma under good control Continue with trigger prevention  Plan  Patient Instructions  Continue on Advair 1 puff .Twice daily  , rinse after use.  Continue on Singulair , Flonase, Claritin .  Activity as tolerated.  Albuterol Inhaler As needed  Wheezing .  Follow up with Dr. Elsworth Soho  or Jahzion Brogden NP in 1 year and As needed

## 2022-04-05 ENCOUNTER — Encounter: Payer: Self-pay | Admitting: Obstetrics & Gynecology

## 2022-04-05 ENCOUNTER — Ambulatory Visit: Payer: PPO | Admitting: Obstetrics & Gynecology

## 2022-04-05 ENCOUNTER — Ambulatory Visit (INDEPENDENT_AMBULATORY_CARE_PROVIDER_SITE_OTHER): Payer: PPO | Admitting: Obstetrics & Gynecology

## 2022-04-05 VITALS — BP 140/80 | HR 83 | Ht 60.75 in | Wt 118.0 lb

## 2022-04-05 DIAGNOSIS — Z780 Asymptomatic menopausal state: Secondary | ICD-10-CM

## 2022-04-05 DIAGNOSIS — Z01419 Encounter for gynecological examination (general) (routine) without abnormal findings: Secondary | ICD-10-CM | POA: Diagnosis not present

## 2022-04-05 NOTE — Progress Notes (Signed)
Denise Gonzales 09-Mar-1946 [id number]   History:    76 y.o. G3P3L3   RP:  Established patient presenting for annual gyn exam    HPI: Postmenopause, well on no HRT.  No PMB.  No pelvic pain.  Abstinent.  Pap Neg 10/2019.  No h/o abnormal Pap.  Repeat Pap at 3-5 yrs.  Urine and bowel movements normal.  Colono in 2018. Breasts normal. Mammo Neg 09/2021. Good body mass index at 22.48. Physically active.  Bone Density in 2021 through her Fam MD.  Health labs with family physician.   Past medical history,surgical history, family history and social history were all reviewed and documented in the EPIC chart.  Gynecologic History No LMP recorded. Patient is postmenopausal.  Obstetric History OB History  Gravida Para Term Preterm AB Living  '3 3 2 1   3  '$ SAB IAB Ectopic Multiple Live Births               # Outcome Date GA Lbr Len/2nd Weight Sex Delivery Anes PTL Lv  3 Preterm           2 Term           1 Term              ROS: A ROS was performed and pertinent positives and negatives are included in the history.  GENERAL: No fevers or chills. HEENT: No change in vision, no earache, sore throat or sinus congestion. NECK: No pain or stiffness. CARDIOVASCULAR: No chest pain or pressure. No palpitations. PULMONARY: No shortness of breath, cough or wheeze. GASTROINTESTINAL: No abdominal pain, nausea, vomiting or diarrhea, melena or bright red blood per rectum. GENITOURINARY: No urinary frequency, urgency, hesitancy or dysuria. MUSCULOSKELETAL: No joint or muscle pain, no back pain, no recent trauma. DERMATOLOGIC: No rash, no itching, no lesions. ENDOCRINE: No polyuria, polydipsia, no heat or cold intolerance. No recent change in weight. HEMATOLOGICAL: No anemia or easy bruising or bleeding. NEUROLOGIC: No headache, seizures, numbness, tingling or weakness. PSYCHIATRIC: No depression, no loss of interest in normal activity or change in sleep pattern.     Exam:   BP (!) 140/80   Pulse 83   Ht 5'  0.75" (1.543 m)   Wt 118 lb (53.5 kg)   SpO2 97%   BMI 22.48 kg/m   Body mass index is 22.48 kg/m.  General appearance : Well developed well nourished female. No acute distress HEENT: Eyes: no retinal hemorrhage or exudates,  Neck supple, trachea midline, no carotid bruits, no thyroidmegaly Lungs: Clear to auscultation, no rhonchi or wheezes, or rib retractions  Heart: Regular rate and rhythm, no murmurs or gallops Breast:Examined in sitting and supine position were symmetrical in appearance, no palpable masses or tenderness,  no skin retraction, no nipple inversion, no nipple discharge, no skin discoloration, no axillary or supraclavicular lymphadenopathy Abdomen: no palpable masses or tenderness, no rebound or guarding Extremities: no edema or skin discoloration or tenderness  Pelvic: Vulva: Normal             Vagina: No gross lesions or discharge  Cervix: No gross lesions or discharge  Uterus  AV, normal size, shape and consistency, non-tender and mobile  Adnexa  Without masses or tenderness  Anus: Normal   Assessment/Plan:  76 y.o. female for annual exam   1. Well female exam with routine gynecological exam Postmenopause, well on no HRT.  No PMB.  No pelvic pain.  Abstinent.  Pap Neg 10/2019.  No h/o  abnormal Pap.  Repeat Pap at 3-5 yrs.  Urine and bowel movements normal.  Colono in 2018. Breasts normal. Mammo Neg 09/2021. Good body mass index at 22.48. Physically active.  Bone Density in 2021 through her Fam MD.  Health labs with family physician.  2. Postmenopause  Postmenopause, well on no HRT.  No PMB.  No pelvic pain.  Abstinent. Good body mass index at 22.48. Physically active, weight bearing.  Bone Density in 2021 through her Fam MD. Vit D, Vit K2 and Ca++ 1.5 g/d total.    Princess Bruins MD, 9:42 AM 04/05/2022

## 2022-05-02 ENCOUNTER — Other Ambulatory Visit: Payer: Self-pay | Admitting: Acute Care

## 2022-07-04 IMAGING — MG MM DIGITAL SCREENING BILAT W/ TOMO AND CAD
8 series · 9 of 24 positions shown · non-contrast
Comparison: Previous exam(s).

CLINICAL DATA: Screening.

EXAM:
DIGITAL SCREENING BILATERAL MAMMOGRAM WITH TOMOSYNTHESIS AND CAD
TECHNIQUE: Bilateral screening digital craniocaudal and mediolateral oblique
mammograms were obtained. Bilateral screening digital breast
tomosynthesis was performed. The images were evaluated with
computer-aided detection.

[L MLO synth-2D]
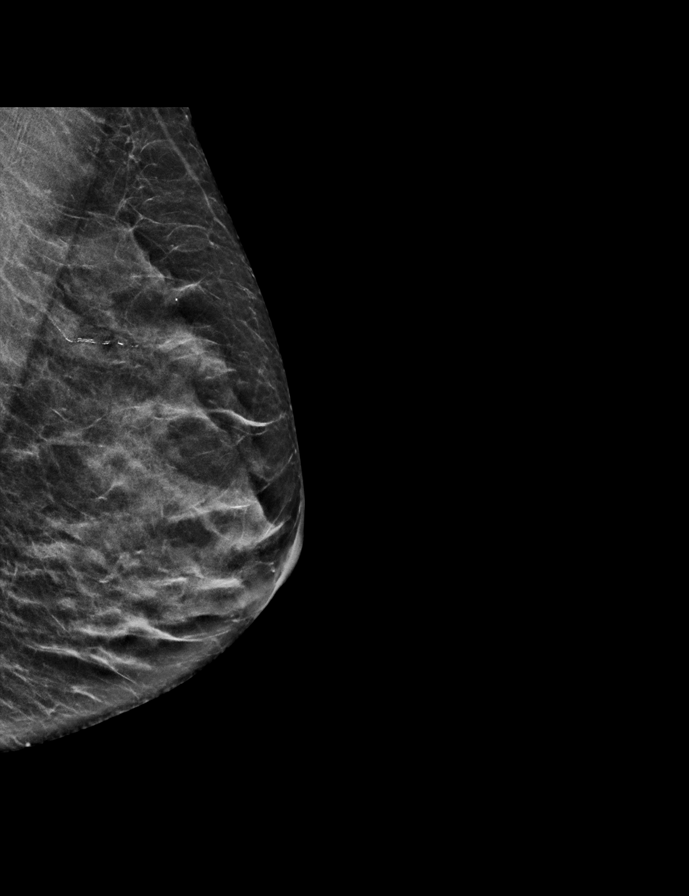

[L CC synth-2D]
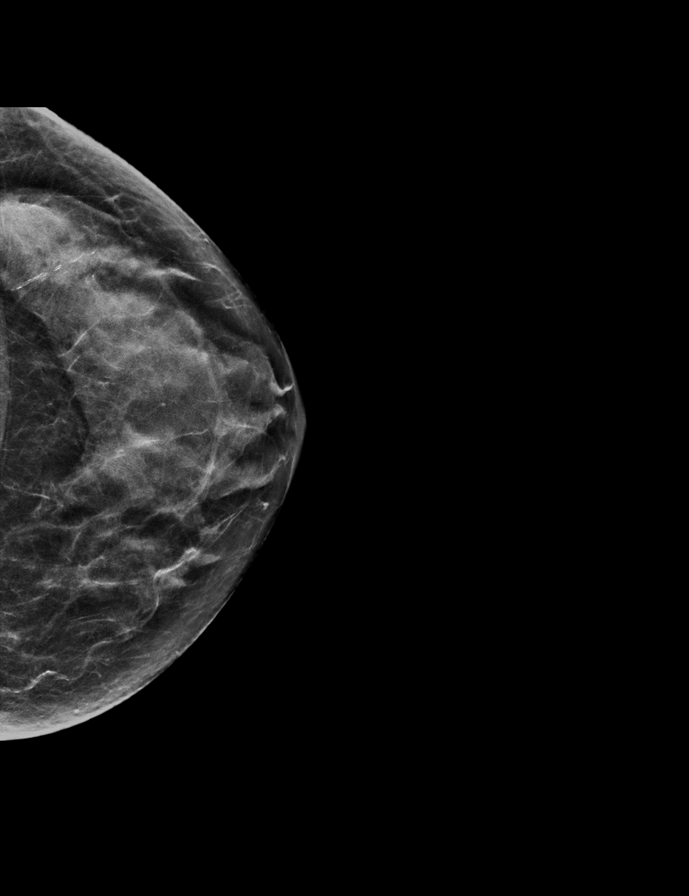

[R CC synth-2D]
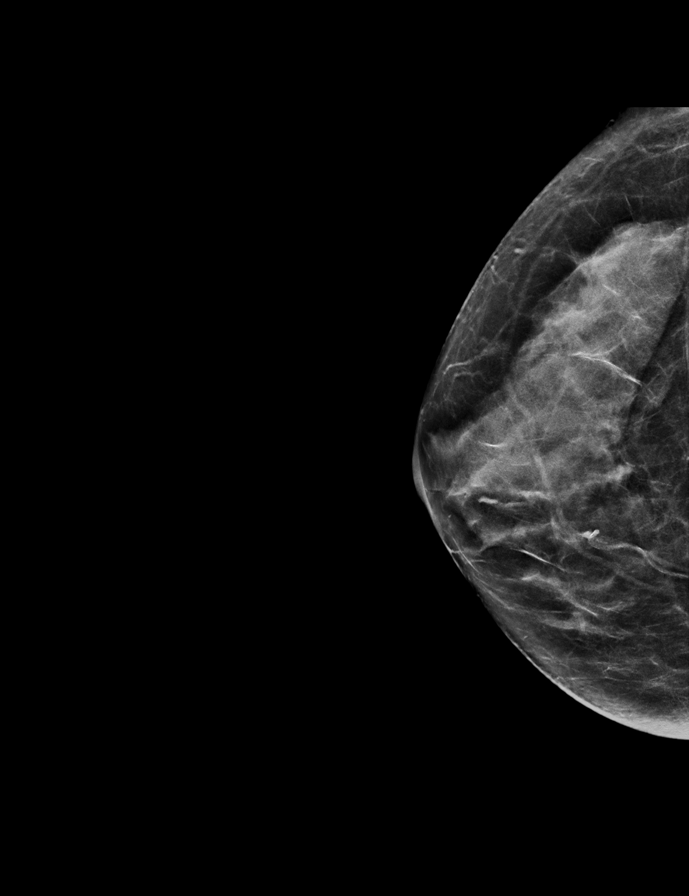

[R MLO synth-2D]
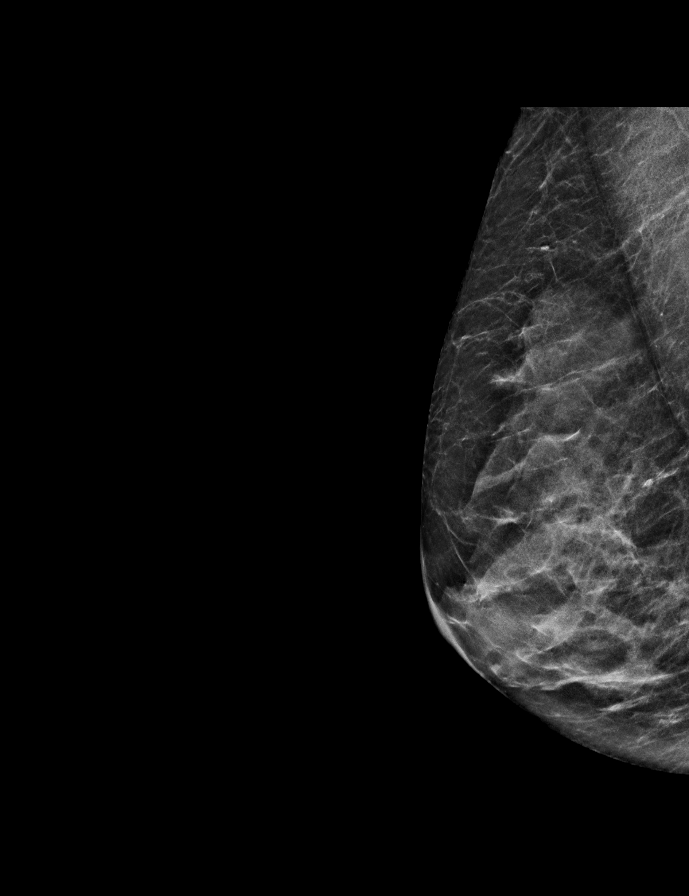

[R MLO tomo · 2 of 46 frames shown]
[frame 15/46]
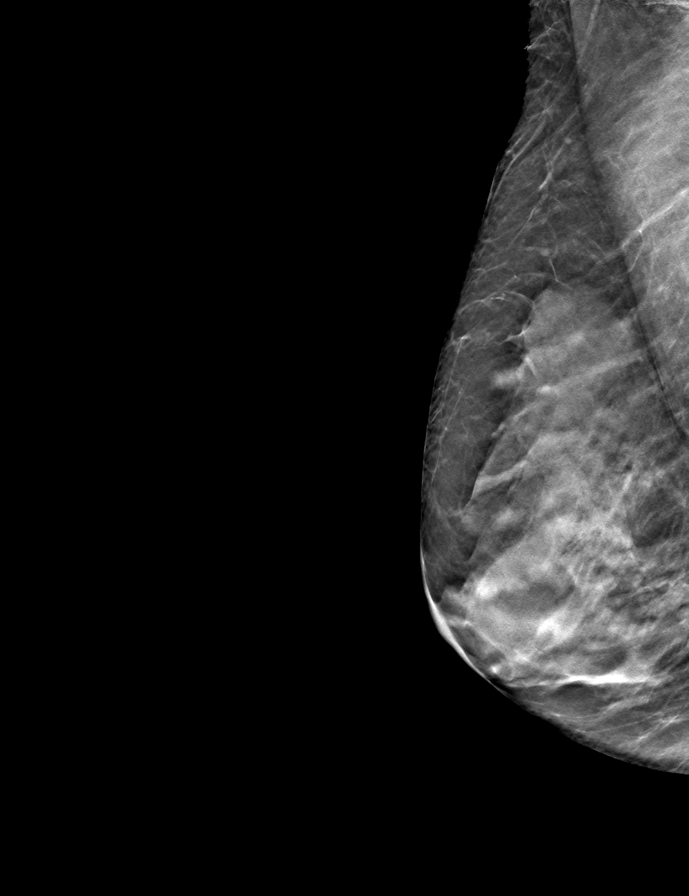
[frame 23/46]
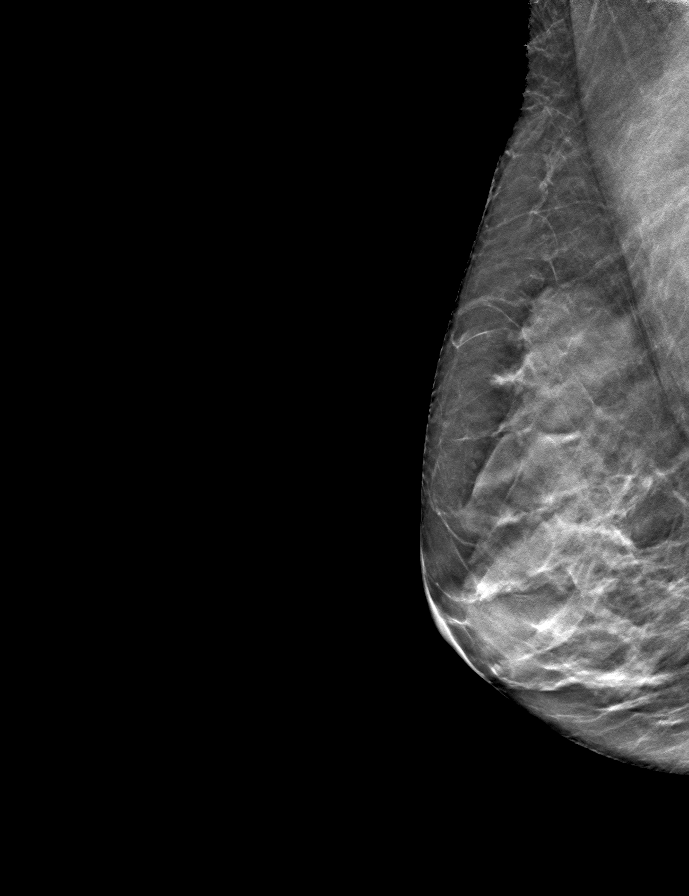

[L CC tomo · tomo slice 27/54.0]
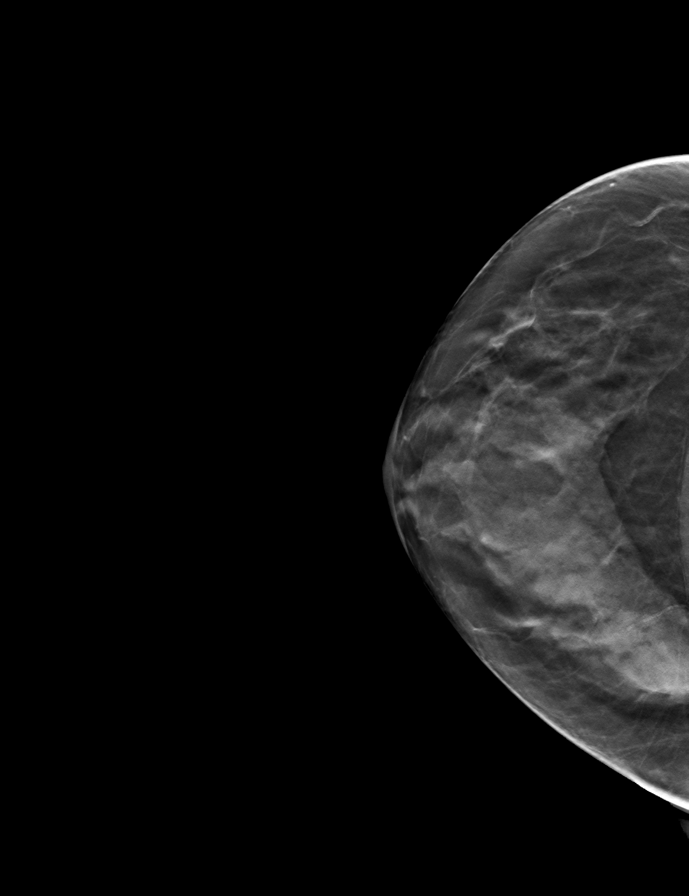

[R CC tomo · tomo slice 28/55.0]
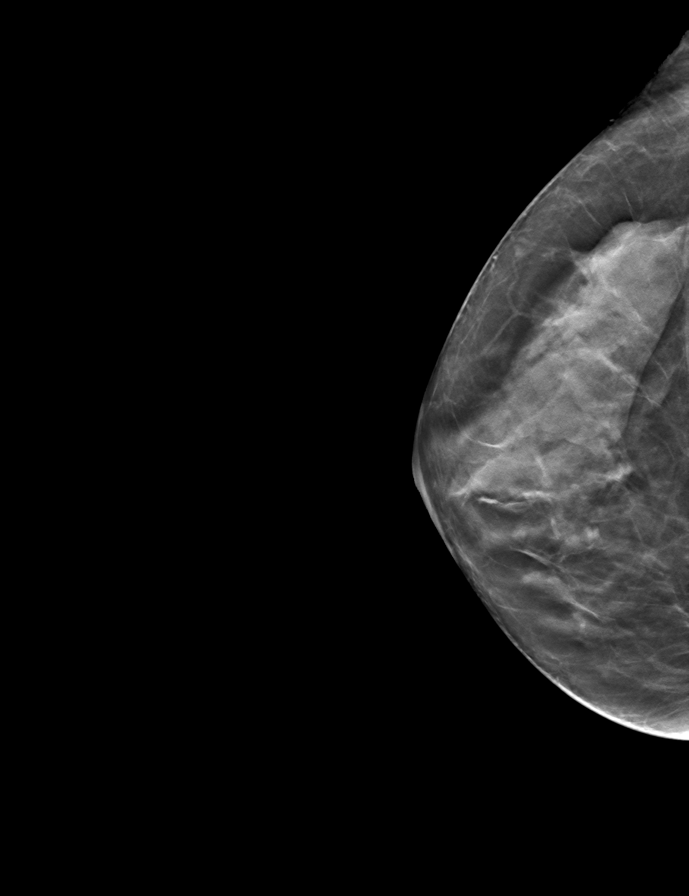

[L MLO tomo · tomo slice 25/48.0]
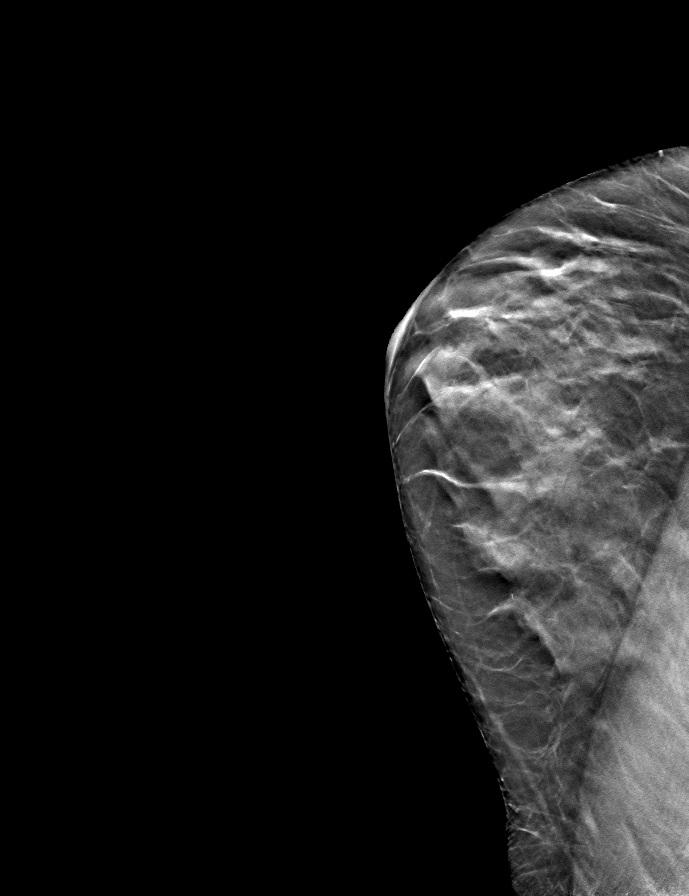

[9 of 24 positions shown; findings below may reference images not displayed]

ACR Breast Density Category c: The breast tissue is heterogeneously
dense, which may obscure small masses.
FINDINGS: In the left breast, possible distortion warrants further evaluation.
In the right breast, no findings suspicious for malignancy.
IMPRESSION: Further evaluation is suggested for possible distortion in the left
breast.

RECOMMENDATION:
Diagnostic mammogram and possibly ultrasound of the left breast.
(Code:19-D-HH3)

The patient will be contacted regarding the findings, and additional
imaging will be scheduled.

BI-RADS CATEGORY  0: Incomplete. Need additional imaging evaluation
and/or prior mammograms for comparison.

## 2022-08-19 ENCOUNTER — Encounter (HOSPITAL_BASED_OUTPATIENT_CLINIC_OR_DEPARTMENT_OTHER): Payer: Self-pay | Admitting: Emergency Medicine

## 2022-08-19 ENCOUNTER — Emergency Department (HOSPITAL_BASED_OUTPATIENT_CLINIC_OR_DEPARTMENT_OTHER): Payer: PPO

## 2022-08-19 ENCOUNTER — Other Ambulatory Visit (HOSPITAL_BASED_OUTPATIENT_CLINIC_OR_DEPARTMENT_OTHER): Payer: Self-pay

## 2022-08-19 ENCOUNTER — Other Ambulatory Visit: Payer: Self-pay

## 2022-08-19 ENCOUNTER — Emergency Department (HOSPITAL_BASED_OUTPATIENT_CLINIC_OR_DEPARTMENT_OTHER)
Admission: EM | Admit: 2022-08-19 | Discharge: 2022-08-19 | Disposition: A | Discharge status: Home / Self Care | Payer: PPO | Attending: Emergency Medicine | Admitting: Emergency Medicine

## 2022-08-19 DIAGNOSIS — Z7951 Long term (current) use of inhaled steroids: Secondary | ICD-10-CM | POA: Diagnosis not present

## 2022-08-19 DIAGNOSIS — I10 Essential (primary) hypertension: Secondary | ICD-10-CM | POA: Diagnosis not present

## 2022-08-19 DIAGNOSIS — Z79899 Other long term (current) drug therapy: Secondary | ICD-10-CM | POA: Diagnosis not present

## 2022-08-19 DIAGNOSIS — M545 Low back pain, unspecified: Secondary | ICD-10-CM | POA: Diagnosis present

## 2022-08-19 DIAGNOSIS — M62830 Muscle spasm of back: Secondary | ICD-10-CM | POA: Insufficient documentation

## 2022-08-19 DIAGNOSIS — J45909 Unspecified asthma, uncomplicated: Secondary | ICD-10-CM | POA: Diagnosis not present

## 2022-08-19 MED ORDER — DIAZEPAM 5 MG PO TABS
5.0000 mg | ORAL_TABLET | Freq: Once | ORAL | Status: AC
  Administered 2022-08-19: 5 mg via ORAL
  Filled 2022-08-19: qty 1

## 2022-08-19 MED ORDER — LIDOCAINE 5 % EX PTCH
1.0000 | MEDICATED_PATCH | CUTANEOUS | Status: DC
Start: 2022-08-19 — End: 2022-08-19
  Administered 2022-08-19: 1 via TRANSDERMAL
  Filled 2022-08-19: qty 1

## 2022-08-19 MED ORDER — METHOCARBAMOL 500 MG PO TABS
500.0000 mg | ORAL_TABLET | Freq: Two times a day (BID) | ORAL | 0 refills | Status: DC
  Filled 2022-08-19: qty 20, 10d supply, fill #0

## 2022-08-19 MED ORDER — LIDOCAINE 5 % EX PTCH
1.0000 | MEDICATED_PATCH | CUTANEOUS | 0 refills | Status: DC
  Filled 2022-08-19: qty 14, 14d supply, fill #0

## 2022-08-19 NOTE — ED Triage Notes (Signed)
Lower back pain , x 1 week . Fell 1 week ago .

## 2022-08-19 NOTE — Discharge Instructions (Addendum)
Your exam today was overall reassuring.  You likely have a muscle spasm.  You received a dose of Valium, lidocaine patch in the emergency department.  I have sent Robaxin and lidocaine patch into your pharmacy.  You can in addition to this take Tylenol as you need to for pain control.  You can take up to 1000 mg every 8 hours.  Your x-ray did not show any concerning findings.  Be very careful when you take Robaxin as this can make you drowsy.  Move around a little slower to ensure that you do not have a fall.  For any concerning symptoms return to the emergency department otherwise follow-up with your primary care provider.

## 2022-08-19 NOTE — ED Notes (Signed)
Pt states pain is located across her mid back x 1 week

## 2022-08-19 NOTE — ED Provider Notes (Signed)
[facility] Provider Note   CSN: [id number] Arrival date & time: 08/19/22  1517     History  Chief Complaint  Patient presents with   Back Pain    lower    Denise Gonzales is a 77 y.o. female.  77 year old female presents today for evaluation of left-sided low back pain that started today.  She states she typically occasionally has back pain but that is typically a dull ache.  She states this feels like her muscle is tight,/spasming.  States she bent over to pick up something and feels that might have been the trigger.  Denies any fever, bowel or bladder incontinence, history of malignancy, saddle anesthesia.  Denies any UTI symptoms such as dysuria, hematuria , or urinary frequency.  The history is provided by the patient. No language interpreter was used.       Home Medications Prior to Admission medications   Medication Sig Start Date End Date Taking? Authorizing Provider  lidocaine (LIDODERM) 5 % Place 1 patch onto the skin daily. Remove & Discard patch within 12 hours or as directed by MD 08/19/22  Yes Deatra Canter, Kariss Longmire, PA-C  methocarbamol (ROBAXIN) 500 MG tablet Take 1 tablet (500 mg total) by mouth 2 (two) times daily. 08/19/22  Yes Margarethe Virgen, PA-C  albuterol (PROAIR HFA) 108 (90 Base) MCG/ACT inhaler Inhale 2 puffs into the lungs every 4 (four) hours as needed. 03/15/22   Parrett, Fonnie Mu, NP  atorvastatin (LIPITOR) 20 MG tablet Take 1 tablet by mouth daily. 09/02/13   [provider]  b complex vitamins tablet Take 1 tablet by mouth daily.    [provider]  diltiazem (CARDIZEM CD) 240 MG 24 hr capsule Take 240 mg by mouth daily.    [provider]  fluticasone Asencion Islam) 50 MCG/ACT nasal spray  11/21/16   [provider]  fluticasone-salmeterol (ADVAIR) 100-50 MCG/ACT AEPB INHALE ONE PUFF BY MOUTH TWICE A DAY 03/15/22   Parrett, Tammy S, NP  hydrochlorothiazide (HYDRODIURIL) 12.5 MG tablet Take 12.5 mg by  mouth daily.    [provider]  lisinopril (PRINIVIL,ZESTRIL) 40 MG tablet Take 40 mg by mouth daily.    [provider]  loratadine (CLARITIN) 10 MG tablet Take 10 mg by mouth daily.    [provider]  montelukast (SINGULAIR) 10 MG tablet Take 1 tablet by mouth daily. 09/02/13   [provider]  Multiple Vitamins-Minerals (CENTRUM SILVER PO) Take 1 tablet by mouth daily.    [provider]  Omega-3 Fatty Acids (FISH OIL PO) Take 1 tablet by mouth daily.    [provider]  OVER THE COUNTER MEDICATION Osteospam (supplement for bone health) 3 daily    [provider]      Allergies    Bactrim [sulfamethoxazole-trimethoprim]    Review of Systems   Review of Systems  Constitutional:  Negative for chills, diaphoresis and fever.  Gastrointestinal:  Negative for abdominal pain, nausea and vomiting.  Genitourinary:  Negative for dysuria, flank pain, frequency and hematuria.  Musculoskeletal:  Positive for back pain. Negative for arthralgias, gait problem and myalgias.  All other systems reviewed and are negative.   Physical Exam Updated Vital Signs BP (!) 146/71 (BP Location: Left Arm)   Pulse 89   Temp 98 F (36.7 C) (Oral)   Resp 20   Wt 53.1 kg   SpO2 98%   BMI 22.29 kg/m  Physical Exam Vitals and nursing note reviewed.  Constitutional:      General: She is not in acute distress.    Appearance: Normal appearance. She is not ill-appearing.  HENT:     Head: Normocephalic and atraumatic.     Nose: Nose normal.  Eyes:     General: No scleral icterus.    Extraocular Movements: Extraocular movements intact.     Conjunctiva/sclera: Conjunctivae normal.  Cardiovascular:     Rate and Rhythm: Normal rate and regular rhythm.     Pulses: Normal pulses.  Pulmonary:     Effort: Pulmonary effort is normal. No respiratory distress.     Breath sounds: Normal breath sounds. No wheezing or rales.  Abdominal:     General:  There is no distension.     Palpations: Abdomen is soft.     Tenderness: There is no abdominal tenderness. There is no right CVA tenderness, left CVA tenderness, guarding or rebound.  Musculoskeletal:        General: Normal range of motion.     Cervical back: Normal range of motion.     Comments: Cervical, thoracic, [place] spine without tenderness to palpation or step-offs.  Mild tenderness palpation over the left paraspinal muscles.  Full range of motion in bilateral upper and lower extremities with 5/5 strength in the extensor and flexor muscle groups.  Neurovascularly intact in bilateral lower extremities.  Sensation intact.  Skin:    General: Skin is warm and dry.  Neurological:     General: No focal deficit present.     Mental Status: She is alert and oriented to person, place, and time. Mental status is at baseline.     ED Results / Procedures / Treatments   Labs (all labs ordered are listed, but only abnormal results are displayed) Labs Reviewed - No data to display  EKG None  Radiology DG Lumbar Spine Complete  Result Date: 08/19/2022 CLINICAL DATA:  Low back pain, 1 week post fall. EXAM: LUMBAR SPINE - COMPLETE 4+ VIEW COMPARISON:  02/09/2021. FINDINGS: No fracture, bone lesion or spondylolisthesis. Curvature, convex the right in the upper lumbar spine and to the left in the lower lumbar spine. Mild loss of disc height at L1-L2 and L2-L3. Marked loss of disc height at L3-L4. Moderate loss of disc height at L4-L5. Scattered aortic atherosclerotic calcifications. Soft tissues otherwise unremarkable. IMPRESSION: 1. No fracture or acute finding. 2. Scoliosis and disc degenerative changes as described, similar to the previous lumbar spine MRI. Electronically Signed   By: Lajean Manes M.D.   On: 08/19/2022 16:57    Procedures Procedures    Medications Ordered in ED Medications  lidocaine (LIDODERM) 5 % 1 patch (1 patch Transdermal Patch Applied 08/19/22 1637)  diazepam (VALIUM)  tablet 5 mg (5 mg Oral Given 08/19/22 1637)    ED Course/ Medical Decision Making/ A&P                             Medical Decision Making Amount and/or Complexity of Data Reviewed Radiology: ordered.  Risk Prescription drug management.   Medical Decision Making / ED Course   This patient presents to the ED for concern of low back pain, this involves an extensive number of treatment options, and is a complaint that carries with it a high risk of complications and morbidity.  The differential diagnosis includes fracture, muscle strain, muscle spasm, UTI, cauda equina syndrome, spinal epidural abscess  MDM: 77 year old female presents today for evaluation of above-mentioned symptoms.  Overall she is well-appearing without acute distress.  Has mild tenderness to palpation over the left lumbar paraspinal muscles.  Describes her symptoms as tightness which is consistent with muscle spasm.  This started after she leaned over to pick up something.  Does have history of low back surgery but states this was laser surgery and no significant instrumentation.  Without red flag signs or symptoms that would raise suspicion for cauda equina syndrome, or spinal epidural abscess.  Will provide dose of Valium in the emergency department along with lidocaine patch.  Will discharge with Robaxin, and lidocaine patch.  Discussed use of Tylenol in addition to this.  Extensive discussion had with patient and her husband as to being very cautious after taking these medications as they can make her very drowsy.  Discussed no driving and being very cautious moves around the house as this would increase risk of fall.  She is in agreement.  Lumbar x-ray obtained which shows no evidence of acute bony abnormality.  Patient is appropriate for discharge.  Discharged in stable condition.  Return precautions discussed.  Patient voices understanding and is in agreement with plan.  Lab Tests: -I ordered, reviewed, and interpreted  labs.   The pertinent results include:   Labs Reviewed - No data to display    EKG  EKG Interpretation  Date/Time:    Ventricular Rate:    PR Interval:    QRS Duration:   QT Interval:    QTC Calculation:   R Axis:     Text Interpretation:           Imaging Studies ordered: I ordered imaging studies including lumbar plain radiographs I independently visualized and interpreted imaging. I agree with the radiologist interpretation   Medicines ordered and prescription drug management: Meds ordered this encounter  Medications   diazepam (VALIUM) tablet 5 mg   lidocaine (LIDODERM) 5 % 1 patch   methocarbamol (ROBAXIN) 500 MG tablet    Sig: Take 1 tablet (500 mg total) by mouth 2 (two) times daily.    Dispense:  20 tablet    Refill:  0    Order Specific Question:   Supervising Provider    Answer:   MILLER, BRIAN [[id number]]   lidocaine (LIDODERM) 5 %    Sig: Place 1 patch onto the skin daily. Remove & Discard patch within 12 hours or as directed by MD    Dispense:  14 patch    Refill:  0    Order Specific Question:   Supervising Provider    Answer:   Sabra Heck, BRIAN [[id number]]    -I have reviewed the patients home medicines and have made adjustments as needed   Reevaluation: After the interventions noted above, I reevaluated the patient and found that they have :stayed the same  Co morbidities that complicate the patient evaluation  Past Medical History:  Diagnosis Date   Asthma    DJD (degenerative joint disease)    GERD (gastroesophageal reflux disease)    History of PSVT (paroxysmal supraventricular tachycardia)    Hyperlipidemia    Hypertension    Osteopenia       Dispostion: Patient is stable for discharge.  Discharged in stable condition.  Return precaution discussed.  Patient voices understanding and is in agreement with plan. Final Clinical Impression(s) / ED Diagnoses Final diagnoses:  Muscle spasm of back    Rx / DC Orders ED Discharge Orders           Ordered    methocarbamol (  ROBAXIN) 500 MG tablet  2 times daily        08/19/22 1711    lidocaine (LIDODERM) 5 %  Every 24 hours        08/19/22 1711              Evlyn Courier, PA-C 08/19/22 1801    Lajean Saver, MD 08/19/22 [phone]

## 2022-08-20 ENCOUNTER — Other Ambulatory Visit (HOSPITAL_BASED_OUTPATIENT_CLINIC_OR_DEPARTMENT_OTHER): Payer: Self-pay

## 2022-08-21 ENCOUNTER — Other Ambulatory Visit (HOSPITAL_BASED_OUTPATIENT_CLINIC_OR_DEPARTMENT_OTHER): Payer: Self-pay

## 2022-09-04 ENCOUNTER — Other Ambulatory Visit: Payer: Self-pay | Admitting: Obstetrics & Gynecology

## 2022-09-04 DIAGNOSIS — Z1231 Encounter for screening mammogram for malignant neoplasm of breast: Secondary | ICD-10-CM

## 2022-10-18 ENCOUNTER — Ambulatory Visit
Admission: RE | Admit: 2022-10-18 | Discharge: 2022-10-18 | Disposition: A | Discharge status: Home / Self Care | Payer: PPO | Source: Ambulatory Visit | Attending: Obstetrics & Gynecology | Admitting: Obstetrics & Gynecology

## 2022-10-18 DIAGNOSIS — Z1231 Encounter for screening mammogram for malignant neoplasm of breast: Secondary | ICD-10-CM

## 2022-10-22 ENCOUNTER — Other Ambulatory Visit: Payer: Self-pay | Admitting: Obstetrics & Gynecology

## 2022-10-22 DIAGNOSIS — R928 Other abnormal and inconclusive findings on diagnostic imaging of breast: Secondary | ICD-10-CM

## 2022-11-14 ENCOUNTER — Ambulatory Visit
Admission: RE | Admit: 2022-11-14 | Discharge: 2022-11-14 | Disposition: A | Discharge status: Home / Self Care | Payer: PPO | Source: Ambulatory Visit | Attending: Obstetrics & Gynecology | Admitting: Obstetrics & Gynecology

## 2022-11-14 ENCOUNTER — Other Ambulatory Visit: Payer: Self-pay | Admitting: Obstetrics & Gynecology

## 2022-11-14 DIAGNOSIS — R928 Other abnormal and inconclusive findings on diagnostic imaging of breast: Secondary | ICD-10-CM

## 2022-11-14 DIAGNOSIS — N6310 Unspecified lump in the right breast, unspecified quadrant: Secondary | ICD-10-CM

## 2022-11-20 ENCOUNTER — Ambulatory Visit
Admission: RE | Admit: 2022-11-20 | Discharge: 2022-11-20 | Disposition: A | Discharge status: Home / Self Care | Payer: PPO | Source: Ambulatory Visit | Attending: Obstetrics & Gynecology | Admitting: Obstetrics & Gynecology

## 2022-11-20 DIAGNOSIS — N6310 Unspecified lump in the right breast, unspecified quadrant: Secondary | ICD-10-CM

## 2022-11-20 HISTORY — PX: BREAST BIOPSY: SHX20

## 2023-03-26 ENCOUNTER — Ambulatory Visit: Payer: PPO | Admitting: Adult Health

## 2023-03-26 ENCOUNTER — Encounter: Payer: Self-pay | Admitting: Adult Health

## 2023-03-26 VITALS — BP 140/68 | HR 70 | Ht 60.75 in | Wt 117.6 lb

## 2023-03-26 DIAGNOSIS — J4530 Mild persistent asthma, uncomplicated: Secondary | ICD-10-CM | POA: Diagnosis not present

## 2023-03-26 DIAGNOSIS — J309 Allergic rhinitis, unspecified: Secondary | ICD-10-CM

## 2023-03-26 MED ORDER — ALBUTEROL SULFATE HFA 108 (90 BASE) MCG/ACT IN AERS: 2.0000 | INHALATION_SPRAY | RESPIRATORY_TRACT | 1 refills | Status: DC | PRN

## 2023-03-26 MED ORDER — FLUTICASONE PROPIONATE 50 MCG/ACT NA SUSP: 2.0000 | Freq: Every day | NASAL | 5 refills | Status: DC | PRN

## 2023-03-26 MED ORDER — MONTELUKAST SODIUM 10 MG PO TABS: 10.0000 mg | ORAL_TABLET | Freq: Every day | ORAL | 11 refills | Status: DC

## 2023-03-26 MED ORDER — FLUTICASONE-SALMETEROL 100-50 MCG/ACT IN AEPB: INHALATION_SPRAY | RESPIRATORY_TRACT | 11 refills | Status: DC

## 2023-03-26 NOTE — Patient Instructions (Addendum)
Continue on Advair 1 puff .Twice daily  , rinse after use.  Continue on Singulair daily  Restart Claritin 10 daily for 2 weeks and then As needed  Restart Flonase 2 puffs daily for 2 weeks and then As needed     Activity as tolerated.  Albuterol Inhaler As needed  Wheezing .  Follow up with Dr. Vassie Loll  or Tou Hayner NP in 1 year with Spirometry .

## 2023-03-26 NOTE — Assessment & Plan Note (Signed)
Mild persistent Asthma -appears controlled.  Continue on current regimen Control for triggers  Check spirometry when she returns   Plan  Patient Instructions  Continue on Advair 1 puff .Twice daily  , rinse after use.  Continue on Singulair daily  Restart Claritin 10 daily for 2 weeks and then As needed  Restart Flonase 2 puffs daily for 2 weeks and then As needed     Activity as tolerated.  Albuterol Inhaler As needed  Wheezing .  Follow up with Dr. Vassie Loll  or Fletcher Ostermiller NP in 1 year with Spirometry .

## 2023-03-26 NOTE — Progress Notes (Signed)
@Patient  ID: Charlane Ferretti, female    DOB: 29-Nov-1945, 77 y.o.   MRN: [id number]  Chief Complaint  Patient presents with   Follow-up    Referring provider: Ronnald Collum  HPI: 77 year old female never smoker followed for asthma and allergic rhinitis Patient is [place] and is bilingual  TEST/EVENTS :  02/2014 Spirometry again showed moderate airway obstruction with FEV1 of 1.39-64%   01/2015 FEV1 63%   11/2016 FEV1 64%, ratio 70   12/2017 FEV1 64%  03/26/2023 Follow up : Asthma and Allergic Rhinitis  Patient returns for a 1 year follow-up.  Patient has mild persistent asthma.  She is on Advair twice daily.  Takes Singulair daily.  Uses Flonase and Claritin as needed.  She says overall her breathing is doing good.  She denies any flare of cough or wheezing.  Has had no increased albuterol use. Remains active at home . Drives. Lives with husband. Does her shopping and cleaning. No formal exercise. Does have sinus drainage and congestion . Not currently using clartin or flonase.       Allergies  Allergen Reactions   Bactrim [Sulfamethoxazole-Trimethoprim] Other (See Comments)    Pt felt like she was having a heart attack.     Immunization History  Administered Date(s) Administered   Influenza Split 04/14/2013   Influenza Whole 04/26/2019   Tdap 05/21/2021    Past Medical History:  Diagnosis Date   Asthma    DJD (degenerative joint disease)    GERD (gastroesophageal reflux disease)    History of PSVT (paroxysmal supraventricular tachycardia)    Hyperlipidemia    Hypertension    Osteopenia     Tobacco History: Social History   Tobacco Use  Smoking Status Never  Smokeless Tobacco Never   Counseling given: Not Answered   Outpatient Medications Prior to Visit  Medication Sig Dispense Refill   atorvastatin (LIPITOR) 20 MG tablet Take 1 tablet by mouth daily.     b complex vitamins tablet Take 1 tablet by mouth daily.     diltiazem (CARDIZEM CD)  240 MG 24 hr capsule Take 240 mg by mouth daily.     hydrochlorothiazide (HYDRODIURIL) 12.5 MG tablet Take 12.5 mg by mouth daily.     lidocaine (LIDODERM) 5 % Place 1 patch onto the skin daily. Remove & Discard patch within 12 hours or as directed by MD 14 patch 0   lisinopril (PRINIVIL,ZESTRIL) 40 MG tablet Take 40 mg by mouth daily.     loratadine (CLARITIN) 10 MG tablet Take 10 mg by mouth daily.     Multiple Vitamins-Minerals (CENTRUM SILVER PO) Take 1 tablet by mouth daily.     Omega-3 Fatty Acids (FISH OIL PO) Take 1 tablet by mouth daily.     OVER THE COUNTER MEDICATION Osteospam (supplement for bone health) 3 daily     albuterol (PROAIR HFA) 108 (90 Base) MCG/ACT inhaler Inhale 2 puffs into the lungs every 4 (four) hours as needed. 18 each 1   fluticasone (FLONASE) 50 MCG/ACT nasal spray      fluticasone-salmeterol (ADVAIR) 100-50 MCG/ACT AEPB INHALE ONE PUFF BY MOUTH TWICE A DAY 60 each 11   montelukast (SINGULAIR) 10 MG tablet Take 1 tablet by mouth daily.     methocarbamol (ROBAXIN) 500 MG tablet Take 1 tablet (500 mg total) by mouth 2 (two) times daily. 20 tablet 0   No facility-administered medications prior to visit.     Review of Systems:   Constitutional:  No  weight loss, night sweats,  Fevers, chills, fatigue, or  lassitude.  HEENT:   No headaches,  Difficulty swallowing,  Tooth/dental problems, or  Sore throat,                No sneezing, itching, ear ache,  +nasal congestion, post nasal drip,   CV:  No chest pain,  Orthopnea, PND, swelling in lower extremities, anasarca, dizziness, palpitations, syncope.   GI  No heartburn, indigestion, abdominal pain, nausea, vomiting, diarrhea, change in bowel habits, loss of appetite, bloody stools.   Resp: No shortness of breath with exertion or at rest.  No excess mucus, no productive cough,  No non-productive cough,  No coughing up of blood.  No change in color of mucus.  No wheezing.  No chest wall deformity  Skin: no rash  or lesions.  GU: no dysuria, change in color of urine, no urgency or frequency.  No flank pain, no hematuria   MS:  No joint pain or swelling.  No decreased range of motion.  No back pain.    Physical Exam  BP (!) 140/68 (BP Location: Left Arm, Cuff Size: Normal)   Pulse 70   Ht 5' 0.75" (1.543 m)   Wt 117 lb 9.6 oz (53.3 kg)   SpO2 99% Comment: on RA  BMI 22.40 kg/m   GEN: A/Ox3; pleasant , NAD, well nourished    HEENT:  [place]/AT,   NOSE-clear, THROAT-clear, no lesions, no postnasal drip or exudate noted.   NECK:  Supple w/ fair ROM; no JVD; normal carotid impulses w/o bruits; no thyromegaly or nodules palpated; no lymphadenopathy.    RESP  Clear  P & A; w/o, wheezes/ rales/ or rhonchi. no accessory muscle use, no dullness to percussion  CARD:  RRR, no m/r/g, no peripheral edema, pulses intact, no cyanosis or clubbing.  GI:   Soft & nt; nml bowel sounds; no organomegaly or masses detected.   Musco: Warm bil, no deformities or joint swelling noted.   Neuro: alert, no focal deficits noted.    Skin: Warm, no lesions or rashes    Lab Results:   BNP No results found for: "BNP"  ProBNP No results found for: "PROBNP"  Imaging: No results found.  Administration History     None           No data to display          No results found for: "NITRICOXIDE"      Assessment & Plan:   Asthma, chronic Mild persistent Asthma -appears controlled.  Continue on current regimen Control for triggers  Check spirometry when she returns   Plan  Patient Instructions  Continue on Advair 1 puff .Twice daily  , rinse after use.  Continue on Singulair daily  Restart Claritin 10 daily for 2 weeks and then As needed  Restart Flonase 2 puffs daily for 2 weeks and then As needed     Activity as tolerated.  Albuterol Inhaler As needed  Wheezing .  Follow up with Dr. Vassie Loll  or Kayle Passarelli NP in 1 year with Spirometry .     Allergic rhinitis Mild flare-restart Flonase and  Claritin daily for the next 2 weeks and then as needed  Plan  Patient Instructions  Continue on Advair 1 puff .Twice daily  , rinse after use.  Continue on Singulair daily  Restart Claritin 10 daily for 2 weeks and then As needed  Restart Flonase 2 puffs daily for 2 weeks and then  As needed     Activity as tolerated.  Albuterol Inhaler As needed  Wheezing .  Follow up with Dr. Vassie Loll  or Ayinde Swim NP in 1 year with Spirometry .       Rubye Oaks, NP 03/26/2023

## 2023-03-26 NOTE — Assessment & Plan Note (Signed)
Mild flare-restart Flonase and Claritin daily for the next 2 weeks and then as needed  Plan  Patient Instructions  Continue on Advair 1 puff .Twice daily  , rinse after use.  Continue on Singulair daily  Restart Claritin 10 daily for 2 weeks and then As needed  Restart Flonase 2 puffs daily for 2 weeks and then As needed     Activity as tolerated.  Albuterol Inhaler As needed  Wheezing .  Follow up with Dr. Vassie Loll  or Jorell Agne NP in 1 year with Spirometry .

## 2023-04-10 ENCOUNTER — Encounter: Payer: PPO | Admitting: Obstetrics and Gynecology

## 2023-05-16 ENCOUNTER — Telehealth: Payer: Self-pay | Admitting: Adult Health

## 2023-05-16 MED ORDER — FLUTICASONE-SALMETEROL 100-50 MCG/ACT IN AEPB: INHALATION_SPRAY | RESPIRATORY_TRACT | 0 refills | Status: DC

## 2023-05-16 NOTE — Telephone Encounter (Signed)
Lm for patient.  Unable to locate pharmacy in [place]. What's the address or phone number?

## 2023-05-16 NOTE — Telephone Encounter (Signed)
One month supply of Advair has been sent to preferred pharmacy.  Lm for patient.

## 2023-05-20 ENCOUNTER — Telehealth: Payer: Self-pay | Admitting: Adult Health

## 2023-05-20 NOTE — Telephone Encounter (Signed)
Patient states Advair needs to be signed by doctor. Patient phone number is [phone].

## 2023-05-22 NOTE — Telephone Encounter (Signed)
Spoke with patient regarding prior message . Patient stated that we sent her Advair to  [id number] - [place], [place] - [address] [facility] [address] [place] [id number]  It needed a provider to sigh . Patient did go to [facility] and no medication waiting for her . Patient stated she will use her Proair until she comes back home .   Advised patient I could resent medication to [facility].Patient stated no thank you .   Patient's voice was understanding.Nothing else further needed.

## 2023-09-03 ENCOUNTER — Encounter (HOSPITAL_BASED_OUTPATIENT_CLINIC_OR_DEPARTMENT_OTHER): Payer: Self-pay

## 2023-09-03 ENCOUNTER — Other Ambulatory Visit: Payer: Self-pay

## 2023-09-03 ENCOUNTER — Emergency Department (HOSPITAL_BASED_OUTPATIENT_CLINIC_OR_DEPARTMENT_OTHER): Payer: PPO

## 2023-09-03 ENCOUNTER — Inpatient Hospital Stay (HOSPITAL_COMMUNITY): Payer: PPO

## 2023-09-03 ENCOUNTER — Inpatient Hospital Stay (HOSPITAL_BASED_OUTPATIENT_CLINIC_OR_DEPARTMENT_OTHER)
Admission: EM | Admit: 2023-09-03 | Discharge: 2023-09-10 | DRG: 481 | Disposition: A | Discharge status: Skilled Nursing Facility | Payer: PPO | Attending: Internal Medicine | Admitting: Internal Medicine

## 2023-09-03 DIAGNOSIS — M8580 Other specified disorders of bone density and structure, unspecified site: Secondary | ICD-10-CM | POA: Diagnosis present

## 2023-09-03 DIAGNOSIS — Z8249 Family history of ischemic heart disease and other diseases of the circulatory system: Secondary | ICD-10-CM | POA: Diagnosis not present

## 2023-09-03 DIAGNOSIS — Z881 Allergy status to other antibiotic agents status: Secondary | ICD-10-CM

## 2023-09-03 DIAGNOSIS — Z825 Family history of asthma and other chronic lower respiratory diseases: Secondary | ICD-10-CM | POA: Diagnosis not present

## 2023-09-03 DIAGNOSIS — W010XXA Fall on same level from slipping, tripping and stumbling without subsequent striking against object, initial encounter: Secondary | ICD-10-CM | POA: Diagnosis present

## 2023-09-03 DIAGNOSIS — S72401P Unspecified fracture of lower end of right femur, subsequent encounter for closed fracture with malunion: Secondary | ICD-10-CM | POA: Diagnosis not present

## 2023-09-03 DIAGNOSIS — E785 Hyperlipidemia, unspecified: Secondary | ICD-10-CM | POA: Diagnosis present

## 2023-09-03 DIAGNOSIS — S72421A Displaced fracture of lateral condyle of right femur, initial encounter for closed fracture: Principal | ICD-10-CM

## 2023-09-03 DIAGNOSIS — D72829 Elevated white blood cell count, unspecified: Secondary | ICD-10-CM | POA: Diagnosis present

## 2023-09-03 DIAGNOSIS — Z800 Family history of malignant neoplasm of digestive organs: Secondary | ICD-10-CM

## 2023-09-03 DIAGNOSIS — E876 Hypokalemia: Secondary | ICD-10-CM | POA: Diagnosis present

## 2023-09-03 DIAGNOSIS — E611 Iron deficiency: Secondary | ICD-10-CM | POA: Diagnosis present

## 2023-09-03 DIAGNOSIS — Z803 Family history of malignant neoplasm of breast: Secondary | ICD-10-CM

## 2023-09-03 DIAGNOSIS — S72451A Displaced supracondylar fracture without intracondylar extension of lower end of right femur, initial encounter for closed fracture: Principal | ICD-10-CM | POA: Diagnosis present

## 2023-09-03 DIAGNOSIS — Z841 Family history of disorders of kidney and ureter: Secondary | ICD-10-CM

## 2023-09-03 DIAGNOSIS — Z7951 Long term (current) use of inhaled steroids: Secondary | ICD-10-CM

## 2023-09-03 DIAGNOSIS — J4489 Other specified chronic obstructive pulmonary disease: Secondary | ICD-10-CM | POA: Diagnosis present

## 2023-09-03 DIAGNOSIS — N179 Acute kidney failure, unspecified: Secondary | ICD-10-CM | POA: Diagnosis not present

## 2023-09-03 DIAGNOSIS — W19XXXA Unspecified fall, initial encounter: Secondary | ICD-10-CM | POA: Diagnosis not present

## 2023-09-03 DIAGNOSIS — M11261 Other chondrocalcinosis, right knee: Secondary | ICD-10-CM | POA: Diagnosis present

## 2023-09-03 DIAGNOSIS — I10 Essential (primary) hypertension: Secondary | ICD-10-CM | POA: Diagnosis present

## 2023-09-03 DIAGNOSIS — J45909 Unspecified asthma, uncomplicated: Secondary | ICD-10-CM | POA: Diagnosis not present

## 2023-09-03 DIAGNOSIS — S72401A Unspecified fracture of lower end of right femur, initial encounter for closed fracture: Secondary | ICD-10-CM

## 2023-09-03 DIAGNOSIS — Z79899 Other long term (current) drug therapy: Secondary | ICD-10-CM | POA: Diagnosis not present

## 2023-09-03 DIAGNOSIS — D62 Acute posthemorrhagic anemia: Secondary | ICD-10-CM | POA: Diagnosis not present

## 2023-09-03 DIAGNOSIS — S728X1A Other fracture of right femur, initial encounter for closed fracture: Secondary | ICD-10-CM | POA: Diagnosis present

## 2023-09-03 DIAGNOSIS — S72431A Displaced fracture of medial condyle of right femur, initial encounter for closed fracture: Secondary | ICD-10-CM | POA: Diagnosis not present

## 2023-09-03 DIAGNOSIS — Y92009 Unspecified place in unspecified non-institutional (private) residence as the place of occurrence of the external cause: Secondary | ICD-10-CM

## 2023-09-03 DIAGNOSIS — Y92000 Kitchen of unspecified non-institutional (private) residence as  the place of occurrence of the external cause: Secondary | ICD-10-CM | POA: Diagnosis not present

## 2023-09-03 LAB — BASIC METABOLIC PANEL
Anion gap: 11 (ref 5–15)
BUN: 32 mg/dL — ABNORMAL HIGH (ref 8–23)
CO2: 22 mmol/L (ref 22–32)
Calcium: 8.8 mg/dL — ABNORMAL LOW (ref 8.9–10.3)
Chloride: 101 mmol/L (ref 98–111)
Creatinine, Ser: 0.77 mg/dL (ref 0.44–1.00)
GFR, Estimated: >60 mL/min (ref >60)
Glucose, Bld: 136 mg/dL — ABNORMAL HIGH (ref 70–99)
Potassium: 3.7 mmol/L (ref 3.5–5.1)
Sodium: 134 mmol/L — ABNORMAL LOW (ref 135–145)

## 2023-09-03 LAB — CBC WITH DIFFERENTIAL/PLATELET
Abs Immature Granulocytes: 0.04 10*3/uL (ref 0.00–0.07)
Basophils Absolute: 0.1 10*3/uL (ref 0.0–0.1)
Basophils Relative: 1 %
Eosinophils Absolute: 0.1 10*3/uL (ref 0.0–0.5)
Eosinophils Relative: 1 %
HCT: 32.1 % — ABNORMAL LOW (ref 36.0–46.0)
Hemoglobin: 10.6 g/dL — ABNORMAL LOW (ref 12.0–15.0)
Immature Granulocytes: 0 %
Lymphocytes Relative: 10 %
Lymphs Abs: 1.3 10*3/uL (ref 0.7–4.0)
MCH: 28.6 pg (ref 26.0–34.0)
MCHC: 33 g/dL (ref 30.0–36.0)
MCV: 86.5 fL (ref 80.0–100.0)
Monocytes Absolute: 0.7 10*3/uL (ref 0.1–1.0)
Monocytes Relative: 5 %
Neutro Abs: 10.3 10*3/uL — ABNORMAL HIGH (ref 1.7–7.7)
Neutrophils Relative %: 83 %
Platelets: 250 10*3/uL (ref 150–400)
RBC: 3.71 MIL/uL — ABNORMAL LOW (ref 3.87–5.11)
RDW: 13.7 % (ref 11.5–15.5)
WBC: 12.4 10*3/uL — ABNORMAL HIGH (ref 4.0–10.5)
nRBC: 0 % (ref 0.0–0.2)

## 2023-09-03 LAB — TSH: TSH: 0.577 u[IU]/mL (ref 0.350–4.500)

## 2023-09-03 LAB — PROTIME-INR
INR: 1 (ref 0.8–1.2)
Prothrombin Time: 13.5 s (ref 11.4–15.2)

## 2023-09-03 LAB — HEMOGLOBIN A1C
Hgb A1c MFr Bld: 5.6 % (ref 4.8–5.6)
Mean Plasma Glucose: 114.02 mg/dL

## 2023-09-03 LAB — T4, FREE: Free T4: 0.84 ng/dL (ref 0.61–1.12)

## 2023-09-03 MED ORDER — CHLORHEXIDINE GLUCONATE 4 % EX SOLN
60.0000 mL | Freq: Once | CUTANEOUS | Status: AC
  Administered 2023-09-03: 4 via TOPICAL
  Filled 2023-09-03: qty 60

## 2023-09-03 MED ORDER — SODIUM CHLORIDE 0.9% FLUSH
3.0000 mL | Freq: Two times a day (BID) | INTRAVENOUS | Status: DC
  Administered 2023-09-03 – 2023-09-09 (×12): 3 mL via INTRAVENOUS

## 2023-09-03 MED ORDER — ACETAMINOPHEN 325 MG PO TABS
650.0000 mg | ORAL_TABLET | Freq: Four times a day (QID) | ORAL | Status: DC | PRN
  Administered 2023-09-04: 650 mg via ORAL
  Filled 2023-09-03 (×2): qty 2

## 2023-09-03 MED ORDER — ALBUTEROL SULFATE (2.5 MG/3ML) 0.083% IN NEBU: 3.0000 mL | INHALATION_SOLUTION | RESPIRATORY_TRACT | Status: DC | PRN

## 2023-09-03 MED ORDER — MONTELUKAST SODIUM 10 MG PO TABS
10.0000 mg | ORAL_TABLET | Freq: Every day | ORAL | Status: DC
  Administered 2023-09-03 – 2023-09-10 (×8): 10 mg via ORAL
  Filled 2023-09-03 (×8): qty 1

## 2023-09-03 MED ORDER — MORPHINE SULFATE (PF) 2 MG/ML IV SOLN: 2.0000 mg | INTRAVENOUS | Status: DC | PRN

## 2023-09-03 MED ORDER — CEFAZOLIN SODIUM-DEXTROSE 2-4 GM/100ML-% IV SOLN
2.0000 g | INTRAVENOUS | Status: AC
  Administered 2023-09-04: 2 g via INTRAVENOUS
  Filled 2023-09-03: qty 100

## 2023-09-03 MED ORDER — DILTIAZEM HCL ER COATED BEADS 120 MG PO CP24: 120.0000 mg | ORAL_CAPSULE | Freq: Every day | ORAL | Status: DC

## 2023-09-03 MED ORDER — HYDROCHLOROTHIAZIDE 12.5 MG PO TABS
12.5000 mg | ORAL_TABLET | Freq: Every day | ORAL | Status: DC
  Filled 2023-09-03: qty 1

## 2023-09-03 MED ORDER — FLUTICASONE PROPIONATE 50 MCG/ACT NA SUSP: 2.0000 | Freq: Every day | NASAL | Status: DC | PRN

## 2023-09-03 MED ORDER — ACETAMINOPHEN 650 MG RE SUPP: 650.0000 mg | Freq: Four times a day (QID) | RECTAL | Status: DC | PRN

## 2023-09-03 MED ORDER — LORATADINE 10 MG PO TABS
10.0000 mg | ORAL_TABLET | Freq: Every day | ORAL | Status: DC
  Administered 2023-09-03 – 2023-09-10 (×8): 10 mg via ORAL
  Filled 2023-09-03 (×8): qty 1

## 2023-09-03 MED ORDER — HYDROCODONE-ACETAMINOPHEN 5-325 MG PO TABS
1.0000 | ORAL_TABLET | Freq: Four times a day (QID) | ORAL | Status: DC | PRN
  Administered 2023-09-03: 1 via ORAL
  Filled 2023-09-03: qty 1
  Filled 2023-09-03: qty 2

## 2023-09-03 MED ORDER — ACETAMINOPHEN 500 MG PO TABS
1000.0000 mg | ORAL_TABLET | Freq: Once | ORAL | Status: AC
  Administered 2023-09-03: 1000 mg via ORAL
  Filled 2023-09-03: qty 2

## 2023-09-03 MED ORDER — DILTIAZEM HCL ER COATED BEADS 120 MG PO CP24: 240.0000 mg | ORAL_CAPSULE | Freq: Every day | ORAL | Status: DC

## 2023-09-03 MED ORDER — LISINOPRIL 20 MG PO TABS
40.0000 mg | ORAL_TABLET | Freq: Every day | ORAL | Status: DC
  Administered 2023-09-04 – 2023-09-05 (×2): 40 mg via ORAL
  Filled 2023-09-03 (×2): qty 2

## 2023-09-03 MED ORDER — SODIUM CHLORIDE 0.9 % IV SOLN: INTRAVENOUS | Status: DC

## 2023-09-03 MED ORDER — POVIDONE-IODINE 10 % EX SWAB
2.0000 | Freq: Once | CUTANEOUS | Status: AC
  Administered 2023-09-04: 2 via TOPICAL

## 2023-09-03 NOTE — Progress Notes (Signed)
Pt placement notified of pt arrival.

## 2023-09-03 NOTE — Progress Notes (Signed)
Patient arrived via EMS to room 5N15. Pt is alert and verbally speaking with staff, has her cell phone and is talking with her family and letting them the room number. Vitals taken and notifying hospitalist and awaiting orders. Pt bed is in low position, wheels locked, call bell within reach, Denise Gonzales oriented to room and use of call bell, bed alarm on.

## 2023-09-03 NOTE — ED Triage Notes (Addendum)
Pt brought by EMS from home.  Turned while at sink and Tripped this morning landing on right knee. Pain and swelling. Unable to extend right leg. Denies LOC and not on blood thinners. Noted to have bruising right chin area

## 2023-09-03 NOTE — ED Provider Notes (Signed)
[facility] Provider Note   CSN: [id number] Arrival date & time: 09/03/23  1046     History  Chief Complaint  Patient presents with   Knee Injury    Denise Gonzales is a 78 y.o. female.  With history of asthma, hypertension, GERD, hyperlipidemia, osteopenia presenting to the ED for evaluation of a fall.  Fall occurred approximately 1.5 hours prior to arrival.  States she was standing in the kitchen when she turned and fell.  She does not know how she fell.  She does not believe she hit her head.  No loss of consciousness.  She is not anticoagulated.  She complains of pain to the right knee.  States there is significant amount of swelling.  She denies numbness, weakness or tingling.  No headaches, vision changes, nausea, vomiting, seizures.  No neck pain.  No facial pain.  She has not taken anything for the pain prior to arrival.  She denies pain anywhere else.  She lives at home with her husband and grandson.  HPI     Home Medications Prior to Admission medications   Medication Sig Start Date End Date Taking? Authorizing Provider  albuterol (PROAIR HFA) 108 (90 Base) MCG/ACT inhaler Inhale 2 puffs into the lungs every 4 (four) hours as needed. 03/26/23   Parrett, Virgel Bouquet, NP  atorvastatin (LIPITOR) 20 MG tablet Take 1 tablet by mouth daily. 09/02/13   [provider]  b complex vitamins tablet Take 1 tablet by mouth daily.    [provider]  diltiazem (CARDIZEM CD) 240 MG 24 hr capsule Take 240 mg by mouth daily.    [provider]  fluticasone (FLONASE) 50 MCG/ACT nasal spray Place 2 sprays into both nostrils daily as needed for allergies or rhinitis. 03/26/23   Parrett, Virgel Bouquet, NP  fluticasone-salmeterol (ADVAIR) 100-50 MCG/ACT AEPB INHALE ONE PUFF BY MOUTH TWICE A DAY 05/16/23   Parrett, Tammy S, NP  hydrochlorothiazide (HYDRODIURIL) 12.5 MG tablet Take 12.5 mg by mouth daily.    [provider]  lidocaine  (LIDODERM) 5 % Place 1 patch onto the skin daily. Remove & Discard patch within 12 hours or as directed by MD 08/19/22   Marita Kansas, PA-C  lisinopril (PRINIVIL,ZESTRIL) 40 MG tablet Take 40 mg by mouth daily.    [provider]  loratadine (CLARITIN) 10 MG tablet Take 10 mg by mouth daily.    [provider]  montelukast (SINGULAIR) 10 MG tablet Take 1 tablet (10 mg total) by mouth daily. 03/26/23   Parrett, Virgel Bouquet, NP  Multiple Vitamins-Minerals (CENTRUM SILVER PO) Take 1 tablet by mouth daily.    [provider]  Omega-3 Fatty Acids (FISH OIL PO) Take 1 tablet by mouth daily.    [provider]  OVER THE COUNTER MEDICATION Osteospam (supplement for bone health) 3 daily    [provider]      Allergies    Bactrim [sulfamethoxazole-trimethoprim]    Review of Systems   Review of Systems  Musculoskeletal:  Positive for arthralgias and joint swelling.  All other systems reviewed and are negative.   Physical Exam Updated Vital Signs BP 138/65   Pulse 64   Temp (!) 97.4 F (36.3 C)   Resp 15   Wt 51.3 kg   SpO2 100%   BMI 21.53 kg/m  Physical Exam Vitals and nursing note reviewed.  Constitutional:      General: She is not in acute distress.  Appearance: Normal appearance. She is normal weight. She is not ill-appearing.  HENT:     Head: Normocephalic.     Comments: Bruise to the right corner of the mouth Neck:     Comments: Normal range of motion.  No midline C-spine TTP or deformities. Pulmonary:     Effort: Pulmonary effort is normal. No respiratory distress.  Abdominal:     General: Abdomen is flat.  Musculoskeletal:     Cervical back: Neck supple.     Comments: Significant swelling to the right knee.  Varus and valgus stress tests positive.  Anterior and posterior drawer test negative.  PT pulse 2+ bilaterally.  Sensation intact distally.  Compartments are soft.  Moderate TTP to the right knee.  No overlying skin changes or  bruising.  Skin:    General: Skin is warm and dry.  Neurological:     Mental Status: She is alert and oriented to person, place, and time.  Psychiatric:        Mood and Affect: Mood normal.        Behavior: Behavior normal.     ED Results / Procedures / Treatments   Labs (all labs ordered are listed, but only abnormal results are displayed) Labs Reviewed  BASIC METABOLIC PANEL - Abnormal; Notable for the following components:      Result Value   Sodium 134 (*)    Glucose, Bld 136 (*)    BUN 32 (*)    Calcium 8.8 (*)    All other components within normal limits  CBC WITH DIFFERENTIAL/PLATELET - Abnormal; Notable for the following components:   WBC 12.4 (*)    RBC 3.71 (*)    Hemoglobin 10.6 (*)    HCT 32.1 (*)    Neutro Abs 10.3 (*)    All other components within normal limits  PROTIME-INR    EKG None  Radiology CT Knee Right Wo Contrast Result Date: 09/03/2023 CLINICAL DATA:  Comminuted fracture distal femur EXAM: CT OF THE RIGHT KNEE WITHOUT CONTRAST TECHNIQUE: Multidetector CT imaging of the right knee was performed according to the standard protocol. Multiplanar CT image reconstructions were also generated. RADIATION DOSE REDUCTION: This exam was performed according to the departmental dose-optimization program which includes automated exposure control, adjustment of the mA and/or kV according to patient size and/or use of iterative reconstruction technique. COMPARISON:  Radiographs 09/03/2023 FINDINGS: Bones/Joint/Cartilage Bony demineralization. OTA 33 A3 extra-articular fracture of the distal femoral metaphysis with substantial comminution. Although components of the fracture extend close to the articular surface, for example anterolaterally near the margin of the lateral femoral trochlear groove, and posteriorly towards the intercondylar notch, no definite extension to the weight-bearing surfaces or definite involvement of the trochlear articular surface is observed.  Meniscal chondrocalcinosis compatible with CPPD arthropathy. Ligaments Suboptimally assessed by CT. Muscles and Tendons Edema tracks along fascia planes in the vicinity of the fractures, including along the vastus musculature Soft tissues Infiltrative edema in the popliteal space especially superiorly. IMPRESSION: 1. Comminuted extra-articular fracture of the distal femoral metaphysis with substantial comminution. Although components of the fracture extend close to the articular surface, no definite extension to the weight-bearing surfaces or definite involvement of the trochlear articular surface is observed. 2. Meniscal chondrocalcinosis compatible with CPPD arthropathy. 3. Bony demineralization. 4. Infiltrative edema in the popliteal space especially superiorly. Electronically Signed   By: Gaylyn Rong M.D.   On: 09/03/2023 13:18   DG Knee Complete 4 Views Right Result Date: 09/03/2023 CLINICAL DATA:  Fall.  Right knee pain and swelling. EXAM: RIGHT KNEE - COMPLETE 4+ VIEW COMPARISON:  None Available. FINDINGS: There is acute, comminuted angulated fracture of the distal right femur. No discrete intra-articular extension seen. No other acute fracture or dislocation. No aggressive osseous lesion. There are degenerative changes of the knee joint in the form of mildly reduced tibio-femoral compartment joint space and osteophytosis. No knee effusion or focal soft tissue swelling. No radiopaque foreign bodies. IMPRESSION: *Acute comminuted fractures of the distal right femur. Electronically Signed   By: Jules Schick M.D.   On: 09/03/2023 12:13   CT Cervical Spine Wo Contrast Result Date: 09/03/2023 CLINICAL DATA:  78 year old female status post trip and fall this morning. Pain and swelling. EXAM: CT CERVICAL SPINE WITHOUT CONTRAST TECHNIQUE: Multidetector CT imaging of the cervical spine was performed without intravenous contrast. Multiplanar CT image reconstructions were also generated. RADIATION DOSE  REDUCTION: This exam was performed according to the departmental dose-optimization program which includes automated exposure control, adjustment of the mA and/or kV according to patient size and/or use of iterative reconstruction technique. COMPARISON:  Head CT today.  CTA head and neck 12/27/2019. FINDINGS: Alignment: Chronic straightening of cervical lordosis. Cervicothoracic junction alignment is within normal limits. Bilateral posterior element alignment is within normal limits. Skull base and vertebrae: Stable bone mineralization since 2021. Visualized skull base is intact. No atlanto-occipital dissociation. C1 and C2 appear intact and aligned. No acute osseous abnormality identified. Soft tissues and spinal canal: No prevertebral fluid or swelling. No visible canal hematoma. Negative visible noncontrast neck soft tissues aside from calcified carotid atherosclerosis. Disc levels: Chronic cervical spine degeneration appears stable since 2021, fairly age-appropriate. Upper chest: Upper thoracic vertebrae appear intact. Chronic apical lung scarring is stable. IMPRESSION: 1. No acute traumatic injury identified in the cervical spine. 2. Stable cervical spine degeneration since 2021. Electronically Signed   By: Odessa Fleming M.D.   On: 09/03/2023 11:46   CT Head Wo Contrast Result Date: 09/03/2023 CLINICAL DATA:  78 year old female status post trip and fall this morning. Pain and swelling. EXAM: CT HEAD WITHOUT CONTRAST TECHNIQUE: Contiguous axial images were obtained from the base of the skull through the vertex without intravenous contrast. RADIATION DOSE REDUCTION: This exam was performed according to the departmental dose-optimization program which includes automated exposure control, adjustment of the mA and/or kV according to patient size and/or use of iterative reconstruction technique. COMPARISON:  Head CT 12/27/2019. FINDINGS: Brain: Cerebral volume loss since 2021 appears to be generalized. No midline shift,  ventriculomegaly, mass effect, evidence of mass lesion, intracranial hemorrhage or evidence of cortically based acute infarction. Chronic but progressed, Patchy and confluent bilateral cerebral white matter hypodensity, moderate for age. Vascular: No suspicious intracranial vascular hyperdensity. Calcified atherosclerosis at the skull base. Skull: Stable, intact. Sinuses/Orbits: Visualized paranasal sinuses and mastoids are clear. Other: Visualized orbits and scalp soft tissues are within normal limits. IMPRESSION: 1. No acute intracranial abnormality or acute traumatic injury identified. 2. Generalized cerebral volume loss and progressed chronic white matter disease since 2021. Electronically Signed   By: Odessa Fleming M.D.   On: 09/03/2023 11:43    Procedures Procedures    Medications Ordered in ED Medications  acetaminophen (TYLENOL) tablet 1,000 mg (1,000 mg Oral Given 09/03/23 1108)    ED Course/ Medical Decision Making/ A&P Clinical Course as of 09/03/23 1406  Wed Sep 03, 2023  1238 Discussed case with orthopedic surgery Dr. Dion Saucier who recommends knee immobilizer, admit to hospitalist at [facility] [AS]  1340 Spoke with  hospitalist who will admit [AS]    Clinical Course User Index [AS] Rebbeca Sheperd, Edsel Petrin, PA-C                                 Medical Decision Making Amount and/or Complexity of Data Reviewed Labs: ordered. Radiology: ordered.  Risk OTC drugs. Decision regarding hospitalization.  This patient presents to the ED for concern of fall, this involves an extensive number of treatment options, and is a complaint that carries with it a high risk of complications and morbidity.  The emergent differential diagnosis for trauma is extensive and requires complex medical decision making. The differential includes, but is not limited to traumatic brain injury, Orbital trauma, maxillofacial trauma, skull fracture, blunt/penetrating neck trauma, vertebral artery dissection, whiplash,  cervical fracture, neurogenic shock, spinal cord injury, thoracic trauma (blunt/penetrating) cardiac trauma, thoracic and lumbar spine trauma. Abdominal trauma (blunt. Penetrating), genitourinary trauma, extremity fractures, skin lacerations/ abrasions, vascular injuries.   My initial workup includes imaging, pain control  Additional history obtained from: Nursing notes from this visit. EMS provides a portion of the history  I ordered, reviewed and interpreted labs which include: CBC, BMP, INR.  Leukocytosis of 12.4.  Anemia with hemoglobin 10.6.  Mild hyponatremia 134, hyperglycemia 136, BUN of 32 with a normal creatinine.   I ordered imaging studies including CT head, C-spine, x-ray right knee, CT right knee I independently visualized and interpreted imaging which showed comminuted extra-articular fracture of the right distal femur with angulation.  Negative CT head and C-spine. I agree with the radiologist interpretation  Consultations Obtained:  I requested consultation with orthopedic surgery Dr. Dion Saucier,  and discussed lab and imaging findings as well as pertinent plan - they recommend: Knee immobilizer, hospitalist admission  I discussed case with hospitalist Dr. Hanley Ben who will admit.  Afebrile, initially hypertensive which improved in the emergency department.  Otherwise hemodynamically stable.  78 year old female presenting to the ED for evaluation of a mechanical fall that occurred just prior to arrival.  She fell to her right knee.  No loss of consciousness.  On exam, she does have significant swelling and decreased ROM to the right knee.  Neurovascular status is intact.  She is alert and oriented.  CT head and C-spine negative.  Imaging of the right knee concerning for comminuted and angulated fracture of the distal femur.  Discussed case with orthopedics and hospitalist as above.  Discussed plan with patient and family at bedside who are in agreement.  Pain is well-controlled.   Stable at the time of admission.  Patient's case discussed with Dr. Maple Hudson who agrees with plan to admit.   Note: Portions of this report may have been transcribed using voice recognition software. Every effort was made to ensure accuracy; however, inadvertent computerized transcription errors may still be present.        Final Clinical Impression(s) / ED Diagnoses Final diagnoses:  Closed bicondylar fracture of right femur, initial encounter [facility])  Fall, initial encounter    Rx / DC Orders ED Discharge Orders     None         Michelle Piper, PA-C 09/03/23 1406    Coral Spikes, DO 09/03/23 1415

## 2023-09-03 NOTE — ED Notes (Signed)
Attempted to call son-in-law x2 for pt transport status per pt request. Sent to voicemail both times.

## 2023-09-03 NOTE — Consult Note (Addendum)
ORTHOPAEDIC CONSULTATION  REQUESTING PHYSICIAN: Gertha Calkin, MD  Chief Complaint: Right leg pain  HPI: Denise Gonzales is a 78 y.o. female who complains of right knee pain after mechanical fall today.  Difficulty bearing weight.  She was seen at [facility] and transferred here for follow-up surgical care.  Patient reports severe pain with movement, better with rest.  She has a knee immobilizer on.  Past Medical History:  Diagnosis Date   Asthma    DJD (degenerative joint disease)    GERD (gastroesophageal reflux disease)    History of PSVT (paroxysmal supraventricular tachycardia)    Hyperlipidemia    Hypertension    Osteopenia    Past Surgical History:  Procedure Laterality Date   BACK SURGERY  1990 and 2004   BREAST BIOPSY Right 11/20/2022   [place] RT BREAST BX W LOC DEV 1ST LESION IMG BX SPEC US GUIDE 11/20/2022 [facility]   BREAST EXCISIONAL BIOPSY     BREAST LUMPECTOMY     CERVICAL POLYPECTOMY     Social History   Socioeconomic History   Marital status: Married    Spouse name: Not on file   Number of children: 3   Years of education: Not on file   Highest education level: Not on file  Occupational History   Occupation: Retired    Associate Professor: RETIRED  Tobacco Use   Smoking status: Never   Smokeless tobacco: Never  Vaping Use   Vaping status: Never Used  Substance and Sexual Activity   Alcohol use: No   Drug use: No   Sexual activity: Not Currently    Partners: Male    Birth control/protection: Post-menopausal    Comment: 1st intercourse- 21, partners- 1  Other Topics Concern   Not on file  Social History Narrative   Not on file   Social Drivers of Health   Financial Resource Strain: Not on file  Food Insecurity: No Food Insecurity (09/03/2023)   Hunger Vital Sign    Worried About Running Out of Food in the Last Year: Never true    Ran Out of Food in the Last Year: Never true  Transportation Needs: No Transportation Needs (09/03/2023)    PRAPARE - Administrator, Civil Service (Medical): No    Lack of Transportation (Non-Medical): No  Physical Activity: Not on file  Stress: Not on file  Social Connections: Socially Integrated (09/03/2023)   Social Connection and Isolation Panel [NHANES]    Frequency of Communication with Friends and Family: More than three times a week    Frequency of Social Gatherings with Friends and Family: Three times a week    Attends Religious Services: More than 4 times per year    Active Member of Clubs or Organizations: Yes    Attends Engineer, structural: More than 4 times per year    Marital Status: Married   Family History  Problem Relation Age of Onset   Asthma Sister    Allergies Sister    Breast cancer Sister    Colon cancer Other        neice at 78 years old   Hypertension Other        multiple family members   Kidney failure Father    Allergies  Allergen Reactions   Bactrim [Sulfamethoxazole-Trimethoprim] Other (See Comments)    Pt felt like she was having a heart attack.      Positive ROS: All other systems have been reviewed and were  otherwise negative with the exception of those mentioned in the HPI and as above.  Physical Exam: General: Alert, no acute distress Cardiovascular: No pedal edema Respiratory: No cyanosis, no use of accessory musculature GI: No organomegaly, abdomen is soft and non-tender Skin: No lesions in the area of chief complaint Neurologic: Sensation intact distally Psychiatric: Patient is competent for consent with normal mood and affect Lymphatic: No axillary or cervical lymphadenopathy  MUSCULOSKELETAL: Right leg has intact neurologic function distally, EHL and FHL are intact.  Positive pain to palpation over the distal femur.  No skin breaks.  Assessment: Principal Problem:   Other fracture of right femur, initial encounter for closed fracture [facility]) Active Problems:   HTN (hypertension)   Fall at home, initial  encounter    Plan: This is an acute severe injury, I recommended surgical fixation with our traumatology specialists.  Plan for surgery tomorrow, I discussed risks benefits and alternatives, further discussion to be had with Dr. Jena Gauss.  N.p.o. after midnight per protocol.  I appreciate medical optimization.  Patient request hydrocodone for pain control, and I have written her for Norco 5/325 1 to 2 tablets p.o. every 6 hours as needed moderate pain.   Eulas Post, MD Cell [phone]   09/03/2023 6:16 PM

## 2023-09-03 NOTE — Progress Notes (Signed)
Patient with right distal femur fracture.  Will need ORIF.  Plan for admission to [facility].  [facility] consult vs. Admission, defer to them.  Will see once gets to [facility].  Surgery tomorrow likely.   Eulas Post, MD

## 2023-09-03 NOTE — Anesthesia Preprocedure Evaluation (Signed)
Anesthesia Evaluation  Patient identified by MRN, date of birth, ID band Patient awake    Reviewed: Allergy & Precautions, NPO status , Patient's Chart, lab work & pertinent test results  Airway Mallampati: II  TM Distance: >3 FB Neck ROM: Full    Dental no notable dental hx.    Pulmonary asthma    Pulmonary exam normal breath sounds clear to auscultation       Cardiovascular hypertension, Pt. on medications Normal cardiovascular exam Rhythm:Regular Rate:Normal     Neuro/Psych negative neurological ROS     GI/Hepatic Neg liver ROS,GERD  Medicated,,  Endo/Other  negative endocrine ROS    Renal/GU negative Renal ROS     Musculoskeletal  (+) Arthritis ,    Abdominal   Peds  Hematology negative hematology ROS (+)   Anesthesia Other Findings   Reproductive/Obstetrics                             Anesthesia Physical Anesthesia Plan  ASA: 3  Anesthesia Plan: General   Post-op Pain Management: Tylenol PO (pre-op)* and Gabapentin PO (pre-op)*   Induction: Intravenous  PONV Risk Score and Plan: Ondansetron, Dexamethasone and Treatment may vary due to age or medical condition  Airway Management Planned: Oral ETT  Additional Equipment:   Intra-op Plan:   Post-operative Plan: Extubation in OR  Informed Consent: I have reviewed the patients History and Physical, chart, labs and discussed the procedure including the risks, benefits and alternatives for the proposed anesthesia with the patient or authorized representative who has indicated his/her understanding and acceptance.     Dental advisory given  Plan Discussed with: CRNA  Anesthesia Plan Comments:        Anesthesia Quick Evaluation

## 2023-09-03 NOTE — H&P (Addendum)
History and Physical    Patient: Denise Gonzales ZOX:[id number] DOB: 1945/12/27 DOA: 09/03/2023 DOS: the patient was seen and examined on 09/03/2023 PCP: Pcp, No  Patient coming from:  [facility] Chief complaint: Chief Complaint  Patient presents with   Knee Injury   HPI:  Denise Gonzales is a 78 y.o. female with past medical history  of allergies to [facility], hypertension, GERD, hyperlipidemia, osteopenia  Brought from med [employer] for fall.  Patient tripped landed on her right right side in the kitchen today,and since then unable to move and extend leg. Fall was about an hour or so before her coming to ED.  NO LOC or AC or AP or aspirin.   >>ED Course:Direct admit.  At bedside patient is alert awake oriented. Vitals:   09/03/23 1330 09/03/23 1400 09/03/23 1558 09/03/23 1622  BP: 138/65 (!) 129/56 (!) 127/59   Pulse: 64 69 78   Temp:   98.6 F (37 C)   Resp: 15 16 18    Height:    5' 2.99" (1.6 m)  Weight:    54.7 kg  SpO2: 100% 99% 98%   TempSrc:   Oral   BMI (Calculated):    21.37   ED evaluation  so far shows: Basic metabolic panel showing glucose 136 sodium 134 normal kidney function. CBC shows leukocytosis of 12.4 hemoglobin of 10.6 and normal platelet count. CT knee shows fracture of femur: 1. Comminuted extra-articular fracture of the distal femoral metaphysis with substantial comminution. Although components of the fracture extend close to the articular surface, no definite extension to the weight-bearing surfaces or definite involvement of the trochlear articular surface is observed. 2. Meniscal chondrocalcinosis compatible with CPPD arthropathy. 3. Bony demineralization. 4. Infiltrative edema in the popliteal space especially superiorly.  In the emergency room  pt has received the following treatment thus far: Medications  acetaminophen (TYLENOL) tablet 1,000 mg (1,000 mg Oral Given 09/03/23 1108)   Review of Systems  Musculoskeletal:  Positive for falls and  joint pain.   Past Medical History:  Diagnosis Date   Asthma    DJD (degenerative joint disease)    GERD (gastroesophageal reflux disease)    History of PSVT (paroxysmal supraventricular tachycardia)    Hyperlipidemia    Hypertension    Osteopenia    Past Surgical History:  Procedure Laterality Date   BACK SURGERY  1990 and 2004   BREAST BIOPSY Right 11/20/2022   [place] RT BREAST BX W LOC DEV 1ST LESION IMG BX SPEC US GUIDE 11/20/2022 [facility]   BREAST EXCISIONAL BIOPSY     BREAST LUMPECTOMY     CERVICAL POLYPECTOMY      reports that she has never smoked. She has never used smokeless tobacco. She reports that she does not drink alcohol and does not use drugs.  Allergies  Allergen Reactions   Bactrim [Sulfamethoxazole-Trimethoprim] Other (See Comments)    Pt felt like she was having a heart attack.     Family History  Problem Relation Age of Onset   Asthma Sister    Allergies Sister    Breast cancer Sister    Colon cancer Other        neice at 78 years old   Hypertension Other        multiple family members   Kidney failure Father     Prior to Admission medications   Medication Sig Start Date End Date Taking? Authorizing Provider  albuterol (PROAIR HFA) 108 (90 Base) MCG/ACT inhaler Inhale  2 puffs into the lungs every 4 (four) hours as needed. 03/26/23   Parrett, Virgel Bouquet, NP  atorvastatin (LIPITOR) 20 MG tablet Take 1 tablet by mouth daily. 09/02/13   [provider]  b complex vitamins tablet Take 1 tablet by mouth daily.    [provider]  diltiazem (CARDIZEM CD) 240 MG 24 hr capsule Take 240 mg by mouth daily.    [provider]  fluticasone (FLONASE) 50 MCG/ACT nasal spray Place 2 sprays into both nostrils daily as needed for allergies or rhinitis. 03/26/23   Parrett, Virgel Bouquet, NP  fluticasone-salmeterol (ADVAIR) 100-50 MCG/ACT AEPB INHALE ONE PUFF BY MOUTH TWICE A DAY 05/16/23   Parrett, Tammy S, NP  hydrochlorothiazide (HYDRODIURIL) 12.5  MG tablet Take 12.5 mg by mouth daily.    [provider]  lidocaine (LIDODERM) 5 % Place 1 patch onto the skin daily. Remove & Discard patch within 12 hours or as directed by MD 08/19/22   Marita Kansas, PA-C  lisinopril (PRINIVIL,ZESTRIL) 40 MG tablet Take 40 mg by mouth daily.    [provider]  loratadine (CLARITIN) 10 MG tablet Take 10 mg by mouth daily.    [provider]  montelukast (SINGULAIR) 10 MG tablet Take 1 tablet (10 mg total) by mouth daily. 03/26/23   Parrett, Virgel Bouquet, NP  Multiple Vitamins-Minerals (CENTRUM SILVER PO) Take 1 tablet by mouth daily.    [provider]  Omega-3 Fatty Acids (FISH OIL PO) Take 1 tablet by mouth daily.    [provider]  OVER THE COUNTER MEDICATION Osteospam (supplement for bone health) 3 daily    [provider]     Vitals:   09/03/23 1330 09/03/23 1400 09/03/23 1558 09/03/23 1622  BP: 138/65 (!) 129/56 (!) 127/59   Pulse: 64 69 78   Resp: 15 16 18    Temp:   98.6 F (37 C)   TempSrc:   Oral   SpO2: 100% 99% 98%   Weight:    54.7 kg  Height:    5' 2.99" (1.6 m)   Physical Exam Vitals and nursing note reviewed.  Constitutional:      General: She is not in acute distress. HENT:     Head: Normocephalic.     Right Ear: Hearing and external ear normal.     Left Ear: Hearing and external ear normal.     Nose: Nose normal. No nasal deformity.     Mouth/Throat:     Lips: Pink.     Tongue: No lesions.     Pharynx: Oropharynx is clear.  Eyes:     General: Lids are normal.     Extraocular Movements: Extraocular movements intact.  Cardiovascular:     Rate and Rhythm: Normal rate and regular rhythm.     Pulses: Normal pulses.     Heart sounds: Normal heart sounds.  Pulmonary:     Effort: Pulmonary effort is normal.     Breath sounds: Normal breath sounds.  Abdominal:     General: Bowel sounds are normal. There is no distension.     Palpations: Abdomen is soft. There is no mass.      Tenderness: There is no abdominal tenderness.  Musculoskeletal:        General: Tenderness, deformity and signs of injury present.     Right lower leg: No edema.     Left lower leg: No edema.  Skin:    General: Skin is warm.  Neurological:  General: No focal deficit present.     Mental Status: She is alert and oriented to person, place, and time.     Cranial Nerves: Cranial nerves 2-12 are intact.  Psychiatric:        Attention and Perception: Attention normal.        Mood and Affect: Mood normal.        Speech: Speech normal.        Behavior: Behavior normal. Behavior is cooperative.     Labs on Admission: I have personally reviewed following labs and imaging studies Results for orders placed or performed during the hospital encounter of 09/03/23 (from the past 24 hours)  Basic metabolic panel     Status: Abnormal   Collection Time: 09/03/23 12:01 PM  Result Value Ref Range   Sodium 134 (L) 135 - 145 mmol/L   Potassium 3.7 3.5 - 5.1 mmol/L   Chloride 101 98 - 111 mmol/L   CO2 22 22 - 32 mmol/L   Glucose, Bld 136 (H) 70 - 99 mg/dL   BUN 32 (H) 8 - 23 mg/dL   Creatinine, Ser 8.11 0.44 - 1.00 mg/dL   Calcium 8.8 (L) 8.9 - 10.3 mg/dL   GFR, Estimated >91 >47 mL/min   Anion gap 11 5 - 15  CBC with Differential     Status: Abnormal   Collection Time: 09/03/23 12:01 PM  Result Value Ref Range   WBC 12.4 (H) 4.0 - 10.5 K/uL   RBC 3.71 (L) 3.87 - 5.11 MIL/uL   Hemoglobin 10.6 (L) 12.0 - 15.0 g/dL   HCT 82.9 (L) 56.2 - 13.0 %   MCV 86.5 80.0 - 100.0 fL   MCH 28.6 26.0 - 34.0 pg   MCHC 33.0 30.0 - 36.0 g/dL   RDW 86.5 78.4 - 69.6 %   Platelets 250 150 - 400 K/uL   nRBC 0.0 0.0 - 0.2 %   Neutrophils Relative % 83 %   Neutro Abs 10.3 (H) 1.7 - 7.7 K/uL   Lymphocytes Relative 10 %   Lymphs Abs 1.3 0.7 - 4.0 K/uL   Monocytes Relative 5 %   Monocytes Absolute 0.7 0.1 - 1.0 K/uL   Eosinophils Relative 1 %   Eosinophils Absolute 0.1 0.0 - 0.5 K/uL   Basophils Relative 1 %    Basophils Absolute 0.1 0.0 - 0.1 K/uL   Immature Granulocytes 0 %   Abs Immature Granulocytes 0.04 0.00 - 0.07 K/uL  Protime-INR     Status: None   Collection Time: 09/03/23 12:01 PM  Result Value Ref Range   Prothrombin Time 13.5 11.4 - 15.2 seconds   INR 1.0 0.8 - 1.2  Hemoglobin A1c     Status: None   Collection Time: 09/03/23  6:21 PM  Result Value Ref Range   Hgb A1c MFr Bld 5.6 4.8 - 5.6 %   Mean Plasma Glucose 114.02 mg/dL   No results found for this or any previous visit (from the past 720 hours). CBC:    Latest Ref Rng & Units 09/03/2023   12:01 PM 12/27/2019    1:48 AM 04/23/2017    7:09 PM  CBC  WBC 4.0 - 10.5 K/uL 12.4  10.2  6.7   Hemoglobin 12.0 - 15.0 g/dL 29.5  28.4  13.2   Hematocrit 36.0 - 46.0 % 32.1  36.5  36.9   Platelets 150 - 400 K/uL 250  282  286    Basic Metabolic Panel: Recent Labs  Lab 09/03/23 1201  NA 134*  K 3.7  CL 101  CO2 22  GLUCOSE 136*  BUN 32*  CREATININE 0.77  CALCIUM 8.8*   Creatinine: Lab Results  Component Value Date   CREATININE 0.77 09/03/2023   CREATININE 0.75 12/27/2019   CREATININE 0.98 04/23/2017   Liver Function Tests:     No data to display         Coagulation Profile: Recent Labs  Lab 09/03/23 1201  INR 1.0   Cardiac Enzymes: No results for input(s): "CKTOTAL", "CKMB", "CKMBINDEX", "TROPONINI" in the last 168 hours. BNP (last 3 results) No results for input(s): "PROBNP" in the last 8760 hours. HbA1C: Recent Labs    09/03/23 1821  HGBA1C 5.6   Lipid Profile: No results for input(s): "CHOL", "HDL", "LDLCALC", "TRIG", "CHOLHDL", "LDLDIRECT" in the last 72 hours.  Radiological Exams on Admission: CT Knee Right Wo Contrast Result Date: 09/03/2023 CLINICAL DATA:  Comminuted fracture distal femur EXAM: CT OF THE RIGHT KNEE WITHOUT CONTRAST TECHNIQUE: Multidetector CT imaging of the right knee was performed according to the standard protocol. Multiplanar CT image reconstructions were also generated.  RADIATION DOSE REDUCTION: This exam was performed according to the departmental dose-optimization program which includes automated exposure control, adjustment of the mA and/or kV according to patient size and/or use of iterative reconstruction technique. COMPARISON:  Radiographs 09/03/2023 FINDINGS: Bones/Joint/Cartilage Bony demineralization. OTA 33 A3 extra-articular fracture of the distal femoral metaphysis with substantial comminution. Although components of the fracture extend close to the articular surface, for example anterolaterally near the margin of the lateral femoral trochlear groove, and posteriorly towards the intercondylar notch, no definite extension to the weight-bearing surfaces or definite involvement of the trochlear articular surface is observed. Meniscal chondrocalcinosis compatible with CPPD arthropathy. Ligaments Suboptimally assessed by CT. Muscles and Tendons Edema tracks along fascia planes in the vicinity of the fractures, including along the vastus musculature Soft tissues Infiltrative edema in the popliteal space especially superiorly. IMPRESSION: 1. Comminuted extra-articular fracture of the distal femoral metaphysis with substantial comminution. Although components of the fracture extend close to the articular surface, no definite extension to the weight-bearing surfaces or definite involvement of the trochlear articular surface is observed. 2. Meniscal chondrocalcinosis compatible with CPPD arthropathy. 3. Bony demineralization. 4. Infiltrative edema in the popliteal space especially superiorly. Electronically Signed   By: Gaylyn Rong M.D.   On: 09/03/2023 13:18   DG Knee Complete 4 Views Right Result Date: 09/03/2023 CLINICAL DATA:  Fall.  Right knee pain and swelling. EXAM: RIGHT KNEE - COMPLETE 4+ VIEW COMPARISON:  None Available. FINDINGS: There is acute, comminuted angulated fracture of the distal right femur. No discrete intra-articular extension seen. No other acute  fracture or dislocation. No aggressive osseous lesion. There are degenerative changes of the knee joint in the form of mildly reduced tibio-femoral compartment joint space and osteophytosis. No knee effusion or focal soft tissue swelling. No radiopaque foreign bodies. IMPRESSION: *Acute comminuted fractures of the distal right femur. Electronically Signed   By: Jules Schick M.D.   On: 09/03/2023 12:13   CT Cervical Spine Wo Contrast Result Date: 09/03/2023 CLINICAL DATA:  78 year old female status post trip and fall this morning. Pain and swelling. EXAM: CT CERVICAL SPINE WITHOUT CONTRAST TECHNIQUE: Multidetector CT imaging of the cervical spine was performed without intravenous contrast. Multiplanar CT image reconstructions were also generated. RADIATION DOSE REDUCTION: This exam was performed according to the departmental dose-optimization program which includes automated exposure control, adjustment of the mA and/or kV according to patient size  and/or use of iterative reconstruction technique. COMPARISON:  Head CT today.  CTA head and neck 12/27/2019. FINDINGS: Alignment: Chronic straightening of cervical lordosis. Cervicothoracic junction alignment is within normal limits. Bilateral posterior element alignment is within normal limits. Skull base and vertebrae: Stable bone mineralization since 2021. Visualized skull base is intact. No atlanto-occipital dissociation. C1 and C2 appear intact and aligned. No acute osseous abnormality identified. Soft tissues and spinal canal: No prevertebral fluid or swelling. No visible canal hematoma. Negative visible noncontrast neck soft tissues aside from calcified carotid atherosclerosis. Disc levels: Chronic cervical spine degeneration appears stable since 2021, fairly age-appropriate. Upper chest: Upper thoracic vertebrae appear intact. Chronic apical lung scarring is stable. IMPRESSION: 1. No acute traumatic injury identified in the cervical spine. 2. Stable cervical  spine degeneration since 2021. Electronically Signed   By: Odessa Fleming M.D.   On: 09/03/2023 11:46   CT Head Wo Contrast Result Date: 09/03/2023 CLINICAL DATA:  78 year old female status post trip and fall this morning. Pain and swelling. EXAM: CT HEAD WITHOUT CONTRAST TECHNIQUE: Contiguous axial images were obtained from the base of the skull through the vertex without intravenous contrast. RADIATION DOSE REDUCTION: This exam was performed according to the departmental dose-optimization program which includes automated exposure control, adjustment of the mA and/or kV according to patient size and/or use of iterative reconstruction technique. COMPARISON:  Head CT 12/27/2019. FINDINGS: Brain: Cerebral volume loss since 2021 appears to be generalized. No midline shift, ventriculomegaly, mass effect, evidence of mass lesion, intracranial hemorrhage or evidence of cortically based acute infarction. Chronic but progressed, Patchy and confluent bilateral cerebral white matter hypodensity, moderate for age. Vascular: No suspicious intracranial vascular hyperdensity. Calcified atherosclerosis at the skull base. Skull: Stable, intact. Sinuses/Orbits: Visualized paranasal sinuses and mastoids are clear. Other: Visualized orbits and scalp soft tissues are within normal limits. IMPRESSION: 1. No acute intracranial abnormality or acute traumatic injury identified. 2. Generalized cerebral volume loss and progressed chronic white matter disease since 2021. Electronically Signed   By: Odessa Fleming M.D.   On: 09/03/2023 11:43    Data Reviewed: Relevant notes from primary care and specialist visits, past discharge summaries as available in EHR, including Care Everywhere. Prior diagnostic testing as pertinent to current admission diagnoses, Updated medications and problem lists for reconciliation ED course, including vitals, labs, imaging, treatment and response to treatment,Triage notes, nursing and pharmacy notes and ED provider's  notes Notable results as noted in HPI.Discussed case with EDMD/ ED APP/ or Specialty MD on call and as needed.  >>Assessment and Plan: >>Right distal femur fracture: Confirmed by CT, Admitted to [facility] team for ORIF repair per orthopedic.  Bed rest and immobilizer per ortho. Fall precaution.  Prn meds for pain with morphine. Neurovascular checks, SCDs, heparin postop.  >>Essential HTN: Vitals:   09/03/23 1052 09/03/23 1200 09/03/23 1330 09/03/23 1400  BP: (!) 181/64 (!) 172/59 138/65 (!) 129/56   09/03/23 1558  BP: (!) 127/59  We will continue lisinopril and hydrochlorothiazide. PT has already taken all three of her meds at home.  Will hold diltiazem and start at reduced dose to prevent fluctuation as pt is also having low bp episodes putting her at risk for dizziness and falls.    >>Fall at home: Fall precaution. Orthostatic vitals.    >>Anemia: Somewhat chronic from 2021.  Type/screen. IV PPI.  Stool occult.   >>Leucocytosis: Could be from fall or infection. We will follow.  Pt is afebrile otherwise and nontoxic. We will get urinalysis, chest  xray and cultures or pt has fever.   DVT prophylaxis:  SCD's Consults:  Orthopedic.  Advance Care Planning:    Code Status: Full Code   Family Communication:  None  Disposition Plan:  TBD.  Severity of Illness: The appropriate patient status for this patient is INPATIENT. Inpatient status is judged to be reasonable and necessary in order to provide the required intensity of service to ensure the patient's safety. The patient's presenting symptoms, physical exam findings, and initial radiographic and laboratory data in the context of their chronic comorbidities is felt to place them at high risk for further clinical deterioration. Furthermore, it is not anticipated that the patient will be medically stable for discharge from the hospital within 2 midnights of admission.   * I certify that at the point of admission it is my  clinical judgment that the patient will require inpatient hospital care spanning beyond 2 midnights from the point of admission due to high intensity of service, high risk for further deterioration and high frequency of surveillance required.*  Author: Gertha Calkin, MD 09/03/2023 6:50 PM  For on call review [email or web address].   Unresulted Labs (From admission, onward)     Start     Ordered   09/04/23 0500  Comprehensive metabolic panel  Tomorrow morning,   R        09/03/23 1718   09/04/23 0500  CBC  Tomorrow morning,   R        09/03/23 1718   09/03/23 1717  TSH  Once,   R        09/03/23 1718   09/03/23 1717  T4, free  Add-on,   AD        09/03/23 1718   Unscheduled  Occult blood card to lab, stool RN will collect  As needed,   R     Question:  Specimen to be collected by:  Answer:  RN will collect   09/03/23 1845            Orders Placed This Encounter  Procedures   Procedural/ Surgical Case Request: OPEN REDUCTION INTERNAL FIXATION (ORIF) DISTAL FEMUR FRACTURE   DG Knee Complete 4 Views Right   CT Head Wo Contrast   CT Cervical Spine Wo Contrast   CT Knee Right Wo Contrast   Basic metabolic panel   CBC with Differential   Protime-INR   Comprehensive metabolic panel   CBC   TSH   Hemoglobin A1c   T4, free   Occult blood card to lab, stool RN will collect   Diet Heart Room service appropriate? Yes; Fluid consistency: Thin   Diet NPO time specified   Diet NPO time specified   Apply ice to affected area (if injury is <48 hours old)   Immobilize affected extremity   Remove jewelry   Apply knee immobilizer   Maintain IV access   Vital signs   Notify physician (specify)   Mobility Protocol: No Restrictions RN to initiate protocols based on patient's level of care   Refer to Sidebar Report Refer to ICU, Med-Surg, Progressive, and Step-Down Mobility Protocol Sidebars   Initiate Adult Central Line Maintenance and Catheter Protocol for patients with central line (CVC,  PICC, Port, Hemodialysis, Trialysis)   Daily weights   Intake and Output   Do not place and if present remove PureWick   Initiate Oral Care Protocol   Initiate Carrier Fluid Protocol   RN may order General Admission PRN Orders utilizing "General Admission PRN medications" (  through manage orders) for the following patient needs: allergy symptoms (Claritin), cold sores (Carmex), cough (Robitussin DM), eye irritation (Liquifilm Tears), hemorrhoids (Tucks), indigestion (Maalox), minor skin irritation (Hydrocortisone Cream), muscle pain Romeo Apple Gay), nose irritation (saline nasal spray) and sore throat (Chloraseptic spray).   Patient has an active order for admit to inpatient/place in observation   Cardiac Monitoring Continuous x 48 hours Indications for use: Other; Other indications for use: fall ? syncope.   Diet per anesthesia ENHANCED RECOVERY AFTER SURGERY (ERAS) Pathway.  Clear liquids after midnight until 4:30am. Finish prescribed drink (Pre-Surgery Ensure or G2 if patient is diabetic) by 4:30am. NPO except meds with a sip of water at 4:30am.   Low Sugar G2 for Diabetic Patients (355 ML)  [SUPPLIED BY DIETARY]   Neurovascular checks   Full code   Consult to orthopedic surgery  Femur fracture [phone]   Consult to hospitalist  Distal femur fracture [phone]   Pulse oximetry check with vital signs   Oxygen therapy Mode or (Route): Nasal cannula; Liters Per Minute: 2; Keep O2 saturation between: greater than 92 %   EKG   Admit to Inpatient (patient's expected length of stay will be greater than 2 midnights or inpatient only procedure)   Admit to Inpatient (patient's expected length of stay will be greater than 2 midnights or inpatient only procedure)   Aspiration precautions   Fall precautions

## 2023-09-04 ENCOUNTER — Other Ambulatory Visit: Payer: Self-pay

## 2023-09-04 ENCOUNTER — Inpatient Hospital Stay (HOSPITAL_COMMUNITY): Payer: Self-pay | Admitting: Anesthesiology

## 2023-09-04 ENCOUNTER — Inpatient Hospital Stay (HOSPITAL_COMMUNITY): Payer: PPO

## 2023-09-04 ENCOUNTER — Encounter (HOSPITAL_COMMUNITY)
Admission: EM | Disposition: A | Discharge status: Skilled Nursing Facility | Payer: Self-pay | Source: Home / Self Care | Attending: Internal Medicine

## 2023-09-04 ENCOUNTER — Inpatient Hospital Stay (HOSPITAL_COMMUNITY): Payer: PPO | Admitting: Anesthesiology

## 2023-09-04 ENCOUNTER — Encounter (HOSPITAL_COMMUNITY): Payer: Self-pay | Admitting: Internal Medicine

## 2023-09-04 DIAGNOSIS — E785 Hyperlipidemia, unspecified: Secondary | ICD-10-CM | POA: Diagnosis not present

## 2023-09-04 DIAGNOSIS — S728X1A Other fracture of right femur, initial encounter for closed fracture: Secondary | ICD-10-CM | POA: Diagnosis not present

## 2023-09-04 DIAGNOSIS — I10 Essential (primary) hypertension: Secondary | ICD-10-CM

## 2023-09-04 DIAGNOSIS — S72451A Displaced supracondylar fracture without intracondylar extension of lower end of right femur, initial encounter for closed fracture: Secondary | ICD-10-CM

## 2023-09-04 DIAGNOSIS — J45909 Unspecified asthma, uncomplicated: Secondary | ICD-10-CM | POA: Diagnosis not present

## 2023-09-04 HISTORY — PX: ORIF FEMUR FRACTURE: SHX2119

## 2023-09-04 LAB — CBC
HCT: 25.3 % — ABNORMAL LOW (ref 36.0–46.0)
HCT: 27.8 % — ABNORMAL LOW (ref 36.0–46.0)
Hemoglobin: 8.5 g/dL — ABNORMAL LOW (ref 12.0–15.0)
Hemoglobin: 9.2 g/dL — ABNORMAL LOW (ref 12.0–15.0)
MCH: 29.2 pg (ref 26.0–34.0)
MCH: 29.1 pg (ref 26.0–34.0)
MCHC: 33.6 g/dL (ref 30.0–36.0)
MCHC: 33.1 g/dL (ref 30.0–36.0)
MCV: 86.9 fL (ref 80.0–100.0)
MCV: 88 fL (ref 80.0–100.0)
Platelets: 201 10*3/uL (ref 150–400)
Platelets: 177 10*3/uL (ref 150–400)
RBC: 2.91 MIL/uL — ABNORMAL LOW (ref 3.87–5.11)
RBC: 3.16 MIL/uL — ABNORMAL LOW (ref 3.87–5.11)
RDW: 13.7 % (ref 11.5–15.5)
RDW: 13.8 % (ref 11.5–15.5)
WBC: 9 10*3/uL (ref 4.0–10.5)
WBC: 10.7 10*3/uL — ABNORMAL HIGH (ref 4.0–10.5)
nRBC: 0 % (ref 0.0–0.2)
nRBC: 0 % (ref 0.0–0.2)

## 2023-09-04 LAB — COMPREHENSIVE METABOLIC PANEL
ALT: 14 U/L (ref 0–44)
AST: 16 U/L (ref 15–41)
Albumin: 3.2 g/dL — ABNORMAL LOW (ref 3.5–5.0)
Alkaline Phosphatase: 54 U/L (ref 38–126)
Anion gap: 9 (ref 5–15)
BUN: 39 mg/dL — ABNORMAL HIGH (ref 8–23)
CO2: 24 mmol/L (ref 22–32)
Calcium: 8.5 mg/dL — ABNORMAL LOW (ref 8.9–10.3)
Chloride: 101 mmol/L (ref 98–111)
Creatinine, Ser: 1.32 mg/dL — ABNORMAL HIGH (ref 0.44–1.00)
GFR, Estimated: 42 mL/min — ABNORMAL LOW (ref >60)
Glucose, Bld: 134 mg/dL — ABNORMAL HIGH (ref 70–99)
Potassium: 3.4 mmol/L — ABNORMAL LOW (ref 3.5–5.1)
Sodium: 134 mmol/L — ABNORMAL LOW (ref 135–145)
Total Bilirubin: 0.6 mg/dL (ref 0.0–1.2)
Total Protein: 5.4 g/dL — ABNORMAL LOW (ref 6.5–8.1)

## 2023-09-04 LAB — VITAMIN D 25 HYDROXY (VIT D DEFICIENCY, FRACTURES): Vit D, 25-Hydroxy: 97.45 ng/mL (ref 30–100)

## 2023-09-04 LAB — CREATININE, SERUM
Creatinine, Ser: 1.16 mg/dL — ABNORMAL HIGH (ref 0.44–1.00)
GFR, Estimated: 49 mL/min — ABNORMAL LOW (ref >60)

## 2023-09-04 LAB — SURGICAL PCR SCREEN
MRSA, PCR: NEGATIVE
Staphylococcus aureus: POSITIVE — AB

## 2023-09-04 SURGERY — OPEN REDUCTION INTERNAL FIXATION (ORIF) DISTAL FEMUR FRACTURE
Anesthesia: General | Laterality: Right

## 2023-09-04 MED ORDER — HYDROCODONE-ACETAMINOPHEN 7.5-325 MG PO TABS
1.0000 | ORAL_TABLET | ORAL | Status: DC | PRN
  Administered 2023-09-04 – 2023-09-05 (×3): 1 via ORAL
  Administered 2023-09-05 – 2023-09-10 (×5): 2 via ORAL
  Filled 2023-09-04: qty 2
  Filled 2023-09-04 (×2): qty 1
  Filled 2023-09-04 (×2): qty 2
  Filled 2023-09-04: qty 1
  Filled 2023-09-04 (×2): qty 2

## 2023-09-04 MED ORDER — HYDROCODONE-ACETAMINOPHEN 5-325 MG PO TABS
1.0000 | ORAL_TABLET | ORAL | Status: DC | PRN
  Administered 2023-09-05 – 2023-09-09 (×5): 2 via ORAL
  Filled 2023-09-04 (×4): qty 2
  Filled 2023-09-04: qty 1
  Filled 2023-09-04: qty 2
  Filled 2023-09-04: qty 1

## 2023-09-04 MED ORDER — POTASSIUM CHLORIDE CRYS ER 20 MEQ PO TBCR
40.0000 meq | EXTENDED_RELEASE_TABLET | Freq: Once | ORAL | Status: AC
  Administered 2023-09-04: 40 meq via ORAL
  Filled 2023-09-04: qty 2

## 2023-09-04 MED ORDER — CHLORHEXIDINE GLUCONATE 0.12 % MT SOLN
OROMUCOSAL | Status: AC
  Administered 2023-09-04: 15 mL
  Filled 2023-09-04: qty 15

## 2023-09-04 MED ORDER — DROPERIDOL 2.5 MG/ML IJ SOLN: 0.6250 mg | Freq: Once | INTRAMUSCULAR | Status: DC | PRN

## 2023-09-04 MED ORDER — ACETAMINOPHEN 325 MG PO TABS
325.0000 mg | ORAL_TABLET | Freq: Four times a day (QID) | ORAL | Status: DC | PRN
  Administered 2023-09-07 – 2023-09-09 (×2): 650 mg via ORAL
  Filled 2023-09-04 (×2): qty 2

## 2023-09-04 MED ORDER — FENTANYL CITRATE (PF) 250 MCG/5ML IJ SOLN
INTRAMUSCULAR | Status: AC
  Filled 2023-09-04: qty 5

## 2023-09-04 MED ORDER — SUGAMMADEX SODIUM 200 MG/2ML IV SOLN
INTRAVENOUS | Status: DC | PRN
  Administered 2023-09-04: 110 mg via INTRAVENOUS

## 2023-09-04 MED ORDER — MUPIROCIN 2 % EX OINT: 1.0000 | TOPICAL_OINTMENT | Freq: Two times a day (BID) | CUTANEOUS | 0 refills | Status: DC

## 2023-09-04 MED ORDER — CEFAZOLIN SODIUM-DEXTROSE 2-4 GM/100ML-% IV SOLN
2.0000 g | Freq: Two times a day (BID) | INTRAVENOUS | Status: AC
  Administered 2023-09-04 – 2023-09-05 (×2): 2 g via INTRAVENOUS
  Filled 2023-09-04 (×2): qty 100

## 2023-09-04 MED ORDER — DILTIAZEM HCL ER COATED BEADS 120 MG PO CP24
240.0000 mg | ORAL_CAPSULE | Freq: Every day | ORAL | Status: DC
  Administered 2023-09-04 – 2023-09-10 (×7): 240 mg via ORAL
  Filled 2023-09-04 (×7): qty 2

## 2023-09-04 MED ORDER — BACITRACIN ZINC 500 UNIT/GM EX OINT
TOPICAL_OINTMENT | CUTANEOUS | Status: AC
  Filled 2023-09-04: qty 28.35

## 2023-09-04 MED ORDER — LIDOCAINE 2% (20 MG/ML) 5 ML SYRINGE
INTRAMUSCULAR | Status: DC | PRN
  Administered 2023-09-04: 50 mg via INTRAVENOUS

## 2023-09-04 MED ORDER — DEXAMETHASONE SODIUM PHOSPHATE 10 MG/ML IJ SOLN
INTRAMUSCULAR | Status: DC | PRN
  Administered 2023-09-04: 5 mg via INTRAVENOUS

## 2023-09-04 MED ORDER — TRANEXAMIC ACID-NACL 1000-0.7 MG/100ML-% IV SOLN
1000.0000 mg | Freq: Once | INTRAVENOUS | Status: AC
  Administered 2023-09-04: 1000 mg via INTRAVENOUS
  Filled 2023-09-04: qty 100

## 2023-09-04 MED ORDER — FENTANYL CITRATE (PF) 250 MCG/5ML IJ SOLN
INTRAMUSCULAR | Status: DC | PRN
  Administered 2023-09-04 (×5): 50 ug via INTRAVENOUS

## 2023-09-04 MED ORDER — POLYETHYLENE GLYCOL 3350 17 G PO PACK: 17.0000 g | PACK | Freq: Every day | ORAL | Status: DC | PRN

## 2023-09-04 MED ORDER — HYDRALAZINE HCL 10 MG PO TABS: 10.0000 mg | ORAL_TABLET | Freq: Four times a day (QID) | ORAL | Status: DC | PRN

## 2023-09-04 MED ORDER — ENOXAPARIN SODIUM 40 MG/0.4ML IJ SOSY: 40.0000 mg | PREFILLED_SYRINGE | INTRAMUSCULAR | Status: DC

## 2023-09-04 MED ORDER — HYDROMORPHONE HCL 1 MG/ML IJ SOLN
INTRAMUSCULAR | Status: AC
  Filled 2023-09-04: qty 1

## 2023-09-04 MED ORDER — METOCLOPRAMIDE HCL 5 MG/ML IJ SOLN: 5.0000 mg | Freq: Three times a day (TID) | INTRAMUSCULAR | Status: DC | PRN

## 2023-09-04 MED ORDER — CHLORHEXIDINE GLUCONATE 4 % EX SOLN: 1.0000 | CUTANEOUS | 1 refills | Status: AC

## 2023-09-04 MED ORDER — ONDANSETRON HCL 4 MG PO TABS: 4.0000 mg | ORAL_TABLET | Freq: Four times a day (QID) | ORAL | Status: DC | PRN

## 2023-09-04 MED ORDER — VANCOMYCIN HCL 1000 MG IV SOLR
INTRAVENOUS | Status: AC
  Filled 2023-09-04: qty 20

## 2023-09-04 MED ORDER — VANCOMYCIN HCL 1000 MG IV SOLR
INTRAVENOUS | Status: DC | PRN
  Administered 2023-09-04: 1000 mg via TOPICAL

## 2023-09-04 MED ORDER — MORPHINE SULFATE (PF) 2 MG/ML IV SOLN: 0.5000 mg | INTRAVENOUS | Status: DC | PRN

## 2023-09-04 MED ORDER — SENNA 8.6 MG PO TABS
1.0000 | ORAL_TABLET | Freq: Every day | ORAL | Status: DC
  Administered 2023-09-04 – 2023-09-05 (×2): 8.6 mg via ORAL
  Filled 2023-09-04 (×2): qty 1

## 2023-09-04 MED ORDER — METOCLOPRAMIDE HCL 5 MG PO TABS: 5.0000 mg | ORAL_TABLET | Freq: Three times a day (TID) | ORAL | Status: DC | PRN

## 2023-09-04 MED ORDER — FLUTICASONE-SALMETEROL 100-50 MCG/ACT IN AEPB
1.0000 | INHALATION_SPRAY | Freq: Two times a day (BID) | RESPIRATORY_TRACT | Status: DC
  Administered 2023-09-04 – 2023-09-10 (×9): 1 via RESPIRATORY_TRACT
  Filled 2023-09-04: qty 14

## 2023-09-04 MED ORDER — EPHEDRINE SULFATE-NACL 50-0.9 MG/10ML-% IV SOSY
PREFILLED_SYRINGE | INTRAVENOUS | Status: DC | PRN
  Administered 2023-09-04: 10 mg via INTRAVENOUS

## 2023-09-04 MED ORDER — METHOCARBAMOL 1000 MG/10ML IJ SOLN: 500.0000 mg | Freq: Four times a day (QID) | INTRAMUSCULAR | Status: DC | PRN

## 2023-09-04 MED ORDER — MUPIROCIN 2 % EX OINT
1.0000 | TOPICAL_OINTMENT | Freq: Two times a day (BID) | CUTANEOUS | Status: AC
  Administered 2023-09-04 – 2023-09-08 (×10): 1 via NASAL
  Filled 2023-09-04 (×3): qty 22

## 2023-09-04 MED ORDER — HYDROMORPHONE HCL 1 MG/ML IJ SOLN
0.2500 mg | INTRAMUSCULAR | Status: DC | PRN
  Administered 2023-09-04: 0.5 mg via INTRAVENOUS

## 2023-09-04 MED ORDER — CHLORHEXIDINE GLUCONATE CLOTH 2 % EX PADS
6.0000 | MEDICATED_PAD | Freq: Every day | CUTANEOUS | Status: AC
  Administered 2023-09-04 – 2023-09-08 (×5): 6 via TOPICAL

## 2023-09-04 MED ORDER — ONDANSETRON HCL 4 MG/2ML IJ SOLN
4.0000 mg | Freq: Four times a day (QID) | INTRAMUSCULAR | Status: DC | PRN
Start: 2023-09-04 — End: 2023-09-10
  Administered 2023-09-04: 4 mg via INTRAVENOUS
  Filled 2023-09-04: qty 2

## 2023-09-04 MED ORDER — PROPOFOL 10 MG/ML IV BOLUS
INTRAVENOUS | Status: AC
  Filled 2023-09-04: qty 20

## 2023-09-04 MED ORDER — ONDANSETRON HCL 4 MG/2ML IJ SOLN
INTRAMUSCULAR | Status: DC | PRN
  Administered 2023-09-04: 4 mg via INTRAVENOUS

## 2023-09-04 MED ORDER — PROPOFOL 10 MG/ML IV BOLUS
INTRAVENOUS | Status: DC | PRN
  Administered 2023-09-04: 80 mg via INTRAVENOUS
  Administered 2023-09-04: 20 mg via INTRAVENOUS

## 2023-09-04 MED ORDER — METHOCARBAMOL 500 MG PO TABS
500.0000 mg | ORAL_TABLET | Freq: Four times a day (QID) | ORAL | Status: DC | PRN
  Administered 2023-09-05 – 2023-09-08 (×4): 500 mg via ORAL
  Filled 2023-09-04 (×5): qty 1

## 2023-09-04 MED ORDER — ROCURONIUM BROMIDE 10 MG/ML (PF) SYRINGE
PREFILLED_SYRINGE | INTRAVENOUS | Status: DC | PRN
  Administered 2023-09-04: 40 mg via INTRAVENOUS

## 2023-09-04 MED ORDER — LACTATED RINGERS IV SOLN: INTRAVENOUS | Status: DC | PRN

## 2023-09-04 MED ORDER — DOCUSATE SODIUM 100 MG PO CAPS
100.0000 mg | ORAL_CAPSULE | Freq: Two times a day (BID) | ORAL | Status: DC
  Administered 2023-09-04 – 2023-09-09 (×9): 100 mg via ORAL
  Filled 2023-09-04 (×11): qty 1

## 2023-09-04 SURGICAL SUPPLY — 63 items
BAG COUNTER SPONGE SURGICOUNT (BAG) ×1 IMPLANT
BIT DRILL 4.3X300MM (BIT) IMPLANT
BIT DRILL LONG 3.3 (BIT) IMPLANT
BIT DRILL QC 3.3X195 (BIT) IMPLANT
BLADE CLIPPER SURG (BLADE) IMPLANT
BNDG COHESIVE 6X5 TAN ST LF (GAUZE/BANDAGES/DRESSINGS) ×1 IMPLANT
BNDG ELASTIC 4INX 5YD STR LF (GAUZE/BANDAGES/DRESSINGS) IMPLANT
BNDG ELASTIC 6INX 5YD STR LF (GAUZE/BANDAGES/DRESSINGS) IMPLANT
BNDG ELASTIC 6X10 VLCR STRL LF (GAUZE/BANDAGES/DRESSINGS) ×1 IMPLANT
BRUSH SCRUB EZ PLAIN DRY (MISCELLANEOUS) ×2 IMPLANT
CAP LOCK NCB (Cap) IMPLANT
CHLORAPREP W/TINT 26 (MISCELLANEOUS) ×1 IMPLANT
COVER SURGICAL LIGHT HANDLE (MISCELLANEOUS) ×1 IMPLANT
DERMABOND ADVANCED .7 DNX6 (GAUZE/BANDAGES/DRESSINGS) IMPLANT
DRAPE C-ARM 42X72 X-RAY (DRAPES) ×1 IMPLANT
DRAPE C-ARMOR (DRAPES) ×1 IMPLANT
DRAPE HALF SHEET 40X57 (DRAPES) ×2 IMPLANT
DRAPE SURG 17X23 STRL (DRAPES) ×1 IMPLANT
DRAPE SURG ORHT 6 SPLT 77X108 (DRAPES) ×2 IMPLANT
DRAPE U-SHAPE 47X51 STRL (DRAPES) ×1 IMPLANT
DRESSING MEPILEX FLEX 4X4 (GAUZE/BANDAGES/DRESSINGS) IMPLANT
DRSG ADAPTIC 3X8 NADH LF (GAUZE/BANDAGES/DRESSINGS) IMPLANT
DRSG MEPILEX FLEX 4X4 (GAUZE/BANDAGES/DRESSINGS) IMPLANT
DRSG MEPILEX POST OP 4X12 (GAUZE/BANDAGES/DRESSINGS) IMPLANT
DRSG MEPILEX POST OP 4X8 (GAUZE/BANDAGES/DRESSINGS) IMPLANT
ELECT REM PT RETURN 9FT ADLT (ELECTROSURGICAL) ×1 IMPLANT
ELECTRODE REM PT RTRN 9FT ADLT (ELECTROSURGICAL) ×1 IMPLANT
GAUZE PAD ABD 8X10 STRL (GAUZE/BANDAGES/DRESSINGS) ×3 IMPLANT
GAUZE SPONGE 4X4 12PLY STRL (GAUZE/BANDAGES/DRESSINGS) ×1 IMPLANT
GLOVE BIO SURGEON STRL SZ 6.5 (GLOVE) ×3 IMPLANT
GLOVE BIO SURGEON STRL SZ7.5 (GLOVE) ×4 IMPLANT
GLOVE BIOGEL PI IND STRL 6.5 (GLOVE) ×1 IMPLANT
GLOVE BIOGEL PI IND STRL 7.5 (GLOVE) ×1 IMPLANT
GOWN STRL REUS W/ TWL LRG LVL3 (GOWN DISPOSABLE) ×3 IMPLANT
K-WIRE FXSTD 280X2XNS SS (WIRE) ×1 IMPLANT
KIT BASIN OR (CUSTOM PROCEDURE TRAY) ×1 IMPLANT
KIT TURNOVER KIT B (KITS) ×1 IMPLANT
KWIRE FXSTD 280X2XNS SS (WIRE) IMPLANT
NS IRRIG 1000ML POUR BTL (IV SOLUTION) ×1 IMPLANT
PACK TOTAL JOINT (CUSTOM PROCEDURE TRAY) ×1 IMPLANT
PAD ARMBOARD 7.5X6 YLW CONV (MISCELLANEOUS) ×1 IMPLANT
PAD CAST 4YDX4 CTTN HI CHSV (CAST SUPPLIES) ×1 IMPLANT
PADDING CAST COTTON 6X4 STRL (CAST SUPPLIES) ×1 IMPLANT
PADDING CAST SYNTHETIC 6X4 NS (CAST SUPPLIES) IMPLANT
PLATE FEM DIST NCB PP 278MM (Plate) IMPLANT
SCREW 5.0 32MM (Screw) IMPLANT
SCREW 5.0 70MM (Screw) IMPLANT
SCREW 5.0 80MM (Screw) IMPLANT
SCREW NCB 3.5X75X5X6.2XST (Screw) IMPLANT
SCREW NCB 4.0 32MM (Screw) IMPLANT
SCREW NCB 5.0X34MM (Screw) IMPLANT
SPONGE T-LAP 18X18 ~~LOC~~+RFID (SPONGE) IMPLANT
SUCTION TUBE FRAZIER 10FR DISP (SUCTIONS) ×1 IMPLANT
SUT ETHILON 3 0 PS 1 (SUTURE) ×2 IMPLANT
SUT MNCRL AB 3-0 PS2 27 (SUTURE) IMPLANT
SUT MON AB 2-0 CT1 36 (SUTURE) IMPLANT
SUT VIC AB 0 CT1 27XBRD ANBCTR (SUTURE) IMPLANT
SUT VIC AB 0 CT1 36 (SUTURE) IMPLANT
SUT VIC AB 1 CT1 27XBRD ANBCTR (SUTURE) IMPLANT
SUT VIC AB 2-0 CT1 TAPERPNT 27 (SUTURE) ×2 IMPLANT
TOWEL GREEN STERILE (TOWEL DISPOSABLE) ×2 IMPLANT
TRAY FOLEY MTR SLVR 16FR STAT (SET/KITS/TRAYS/PACK) IMPLANT
WATER STERILE IRR 1000ML POUR (IV SOLUTION) ×2 IMPLANT

## 2023-09-04 NOTE — Interval H&P Note (Signed)
History and Physical Interval Note:  09/04/2023 7:36 AM  Denise Gonzales  has presented today for surgery, with the diagnosis of Right distal femur fracture.  The various methods of treatment have been discussed with the patient and family. After consideration of risks, benefits and other options for treatment, the patient has consented to  Procedure(s): OPEN REDUCTION INTERNAL FIXATION (ORIF) DISTAL FEMUR FRACTURE (Right) as a surgical intervention.  The patient's history has been reviewed, patient examined, no change in status, stable for surgery.  I have reviewed the patient's chart and labs.  Questions were answered to the patient's satisfaction.     Caryn Bee P Laylynn Campanella

## 2023-09-04 NOTE — Op Note (Signed)
Orthopaedic Surgery Operative Note (CSN: [id number] ) Date of Surgery: 09/04/2023  Admit Date: 09/03/2023   Diagnoses: Pre-Op Diagnoses: Right supracondylar distal femur fracture  Post-Op Diagnosis: Same  Procedures: CPT 27511-Open reduction internal fixation of right distal femur fracture  Surgeons : Primary: Roby Lofts, MD  Assistant: Thyra Breed, PA-C  Location: OR 3   Anesthesia:General   Antibiotics: Ancef 2g preop with 1 gm vancomycin powder placed topically   Tourniquet time: None    Estimated Blood Loss: 15 mL  Complications: None  Specimens:None   Implants: Implant Name Type Inv. Item Serial No. Manufacturer Lot No. LRB No. Used Action  CAP LOCK NCB - [id number] Cap CAP LOCK NCB  [employer](ORTH,TRAU,BIO,SG)  Right 7 Implanted  PLATE FEM DIST NCB PP - [id number] Plate PLATE FEM DIST NCB PP  [employer](ORTH,TRAU,BIO,SG)  Right 1 Implanted  SCREW NCB 4.0 - [id number] Screw SCREW NCB 4.0  [employer](ORTH,TRAU,BIO,SG)  Right 1 Implanted  SCREW 5.0 - [id number] Screw SCREW 5.0  [employer](ORTH,TRAU,BIO,SG)  Right 1 Implanted  SCREW 5.0 - [id number] Screw SCREW 5.0  [employer](ORTH,TRAU,BIO,SG)  Right 3 Implanted  SCREW NCB 5.0X34MM - [id number] Screw SCREW NCB 5.0X34MM  [employer](ORTH,TRAU,BIO,SG)  Right 1 Implanted  SCREW 5.0 - [id number] Screw SCREW 5.0  [employer](ORTH,TRAU,BIO,SG)  Right 1 Implanted  SCREW NCB 6.0F09N2T5.2XST - [id number] Screw SCREW NCB [id number]  [employer](ORTH,TRAU,BIO,SG)  Right 1 Implanted     Indications for Surgery: 78 year old female who sustained a ground-level fall with a right supracondylar distal femur fracture.  Due to the unstable nature of her injury I recommend proceeding with open reduction internal fixation.  Risks and benefits were discussed with the patient.  Risks included but not limited to bleeding, infection, malunion, nonunion, hardware  failure, hardware rotation, nerve and blood vessel injury, DVT, even possibility anesthetic complications.  She agreed to proceed with surgery and consent was obtained.  Operative Findings: Open reduction fixation of right extra-articular supracondylar distal femur fracture using [employer] NCB 12 hole distal femoral locking plate.  Procedure: The patient was identified in the preoperative holding area. Consent was confirmed with the patient and their family and all questions were answered. The operative extremity was marked after confirmation with the patient. she was then brought back to the operating room by our anesthesia colleagues.  She was placed under general anesthetic and carefully transferred over to radiolucent flattop table.  A bump was placed under her operative hip.  The right lower extremity was then prepped and draped in usual sterile fashion.  A timeout was performed to verify the patient, the procedure, and the extremity.  Preoperative antibiotics were dosed.  Fluoroscopic imaging was obtained to show the unstable nature of her injury.  The hip and knee were flexed over a triangle.  Traction was applied by my assistant and the fracture was aligned appropriately.  A lateral approach to the distal femur was made and carried down through skin subcutaneous tissue.  I incised through the IT band and mobilized the vastus lateralis off the lateral portion of the femur.  I then chose a 12 hole [employer] NCB distal femoral locking plate and slid this submuscularly along the lateral cortex of the femur.  I attached to a targeting arm and aligned the distal portion with a 2.0 mm K wire.  I then aligned the proximal portion with a 3.3 mm drill bit through the proximal hole of the plate.  I then drilled and placed 5.0 millimeter screws distally to bring the plate flush to bone.  I then percutaneously placed 5.0 millimeter screws in the femoral shaft through the targeting arm.  I then removed the  3.3 mm drill bit proximally and placed 4.0 millimeter screw in its position.  I then placed locking caps on the 5.0 millimeter screws in the femoral shaft.  Return to the distal segment and proceeded to place 3 more 5.0 millimeter screws.  Excellent fixation was obtained and locking caps were placed on all the distal screws.  Final fluoroscopic imaging was obtained.  The incisions were copiously irrigated.  A gram of vancomycin powder was placed into the incision.  A layered closure of 0 Vicryl, 2-0 Monocryl and 3-0 Monocryl were used to close the skin.  Sterile dressings were applied.  The patient was then awoken from anesthesia and taken to the PACU in stable condition.  Post Op Plan/Instructions: The patient will be weightbearing as tolerated to the right lower extremity.  She will receive postoperative Ancef.  She will be placed on Lovenox for DVT prophylaxis and discharged on oral DOAC.  Will have her mobilize with physical and Occupational Therapy.  I was present and performed the entire surgery.  Thyra Breed, PA-C did assist me throughout the case. An assistant was necessary given the difficulty in approach, maintenance of reduction and ability to instrument the fracture.   Truitt Merle, MD [facility]

## 2023-09-04 NOTE — H&P (View-Only) (Signed)
Orthopaedic Trauma Service (OTS) Consult   Patient ID: Denise Gonzales MRN: [id number] DOB/AGE: 1946/05/18 78 y.o.  Reason for Consult:Right supracondylar distal femur Referring Physician: Dr. Dorthula Nettles, MD Denise Gonzales  HPI: Denise Gonzales is an 78 y.o. female who is being seen in consultation at the request of Dr. Dion Gonzales for evaluation of right supracondylar distal femur fracture.  Patient had a fall sustaining the above injury.  She presented to med [facility] at which point she was transferred here to [facility] to undergo surgical evaluation.  Due to complexity of her fracture Dr. Dion Gonzales felt that this would best be treated with an orthopedic traumatologist.  Patient was seen and evaluated in the preoperative holding area.  Her daughter is at bedside.  She lives at home with her husband as well as her grandson she ambulates mostly without assist device occasionally uses a cane or a walker.  She denies any history of diabetes denies any other injuries.  Past Medical History:  Diagnosis Date   Asthma    DJD (degenerative joint disease)    GERD (gastroesophageal reflux disease)    History of PSVT (paroxysmal supraventricular tachycardia)    Hyperlipidemia    Hypertension    Osteopenia     Past Surgical History:  Procedure Laterality Date   BACK SURGERY  1990 and 2004   BREAST BIOPSY Right 11/20/2022   [place] RT BREAST BX W LOC DEV 1ST LESION IMG BX SPEC US GUIDE 11/20/2022 [facility]   BREAST EXCISIONAL BIOPSY     BREAST LUMPECTOMY     CERVICAL POLYPECTOMY      Family History  Problem Relation Age of Onset   Asthma Sister    Allergies Sister    Breast cancer Sister    Colon cancer Other        neice at 78 years old   Hypertension Other        multiple family members   Kidney failure Father     Social History:  reports that she has never smoked. She has never used smokeless tobacco. She reports that she does not drink alcohol and does not use drugs.  Allergies:   Allergies  Allergen Reactions   Bactrim [Sulfamethoxazole-Trimethoprim] Other (See Comments)    Pt felt like she was having a heart attack.     Medications:  No current facility-administered medications on file prior to encounter.   Current Outpatient Medications on File Prior to Encounter  Medication Sig Dispense Refill   albuterol (PROAIR HFA) 108 (90 Base) MCG/ACT inhaler Inhale 2 puffs into the lungs every 4 (four) hours as needed. 18 each 1   b complex vitamins tablet Take 1 tablet by mouth daily.     Multiple Vitamins-Minerals (CENTRUM SILVER PO) Take 1 tablet by mouth daily.     Omega-3 Fatty Acids (FISH OIL PO) Take 1 tablet by mouth daily.     OVER THE COUNTER MEDICATION Osteospam (supplement for bone health) 3 daily     atorvastatin (LIPITOR) 20 MG tablet Take 1 tablet by mouth daily.     diltiazem (CARDIZEM CD) 240 MG 24 hr capsule Take 240 mg by mouth daily.     fluticasone (FLONASE) 50 MCG/ACT nasal spray Place 2 sprays into both nostrils daily as needed for allergies or rhinitis. 1 g 5   fluticasone-salmeterol (ADVAIR) 100-50 MCG/ACT AEPB INHALE ONE PUFF BY MOUTH TWICE A DAY 60 each 0   hydrochlorothiazide (HYDRODIURIL) 12.5 MG tablet Take 12.5 mg by mouth  daily.     lidocaine (LIDODERM) 5 % Place 1 patch onto the skin daily. Remove & Discard patch within 12 hours or as directed by MD (Patient not taking: Reported on 09/03/2023) 14 patch 0   lisinopril (PRINIVIL,ZESTRIL) 40 MG tablet Take 40 mg by mouth daily.     loratadine (CLARITIN) 10 MG tablet Take 10 mg by mouth daily.     montelukast (SINGULAIR) 10 MG tablet Take 1 tablet (10 mg total) by mouth daily. 30 tablet 11   Vitamin D, Ergocalciferol, (DRISDOL) 1.25 MG (50000 UNIT) CAPS capsule Take 50,000 Units by mouth once a week. (Patient not taking: Reported on 09/03/2023)       ROS: Constitutional: No fever or chills Vision: No changes in vision ENT: No difficulty swallowing CV: No chest pain Pulm: No SOB or  wheezing GI: No nausea or vomiting GU: No urgency or inability to hold urine Skin: No poor wound healing Neurologic: No numbness or tingling Psychiatric: No depression or anxiety Heme: No bruising Allergic: No reaction to medications or food   Exam: Blood pressure 102/70, pulse 71, temperature 97.9 F (36.6 C), temperature source Oral, resp. rate 18, height 5\' 2"  (1.575 m), weight 50.8 kg, SpO2 97%. General: No acute distress Orientation: Awake alert and oriented x 3 Mood and Affect: Cooperative and pleasant Gait: Unable to assess due to her fracture Coordination and balance: Within normal limits  Right lower extremity: Knee immobilizer is in place.  Compartments are soft compressible.  She has active dorsiflexion plantarflexion of foot and ankle.  She is warm well-perfused foot with 2+ DP and PT pulses.  She endorses sensation to the entirety of her foot.  She has moderate swelling to her knee no previous surgical incisions.  No significant deformity to her leg.  Left lower extremity: Skin without lesions. No tenderness to palpation. Full painless ROM, full strength in each muscle groups without evidence of instability.   Medical Decision Making: Data: Imaging: X-rays and CT scan have been reviewed which shows an extra-articular supracondylar distal femur fracture.  Labs:  Results for orders placed or performed during the hospital encounter of 09/03/23 (from the past 24 hours)  Basic metabolic panel     Status: Abnormal   Collection Time: 09/03/23 12:01 PM  Result Value Ref Range   Sodium 134 (L) 135 - 145 mmol/L   Potassium 3.7 3.5 - 5.1 mmol/L   Chloride 101 98 - 111 mmol/L   CO2 22 22 - 32 mmol/L   Glucose, Bld 136 (H) 70 - 99 mg/dL   BUN 32 (H) 8 - 23 mg/dL   Creatinine, Ser 3.87 0.44 - 1.00 mg/dL   Calcium 8.8 (L) 8.9 - 10.3 mg/dL   GFR, Estimated >56 >43 mL/min   Anion gap 11 5 - 15  CBC with Differential     Status: Abnormal   Collection Time: 09/03/23 12:01 PM   Result Value Ref Range   WBC 12.4 (H) 4.0 - 10.5 K/uL   RBC 3.71 (L) 3.87 - 5.11 MIL/uL   Hemoglobin 10.6 (L) 12.0 - 15.0 g/dL   HCT 32.9 (L) 51.8 - 84.1 %   MCV 86.5 80.0 - 100.0 fL   MCH 28.6 26.0 - 34.0 pg   MCHC 33.0 30.0 - 36.0 g/dL   RDW 66.0 63.0 - 16.0 %   Platelets 250 150 - 400 K/uL   nRBC 0.0 0.0 - 0.2 %   Neutrophils Relative % 83 %   Neutro Abs 10.3 (H) 1.7 -  7.7 K/uL   Lymphocytes Relative 10 %   Lymphs Abs 1.3 0.7 - 4.0 K/uL   Monocytes Relative 5 %   Monocytes Absolute 0.7 0.1 - 1.0 K/uL   Eosinophils Relative 1 %   Eosinophils Absolute 0.1 0.0 - 0.5 K/uL   Basophils Relative 1 %   Basophils Absolute 0.1 0.0 - 0.1 K/uL   Immature Granulocytes 0 %   Abs Immature Granulocytes 0.04 0.00 - 0.07 K/uL  Protime-INR     Status: None   Collection Time: 09/03/23 12:01 PM  Result Value Ref Range   Prothrombin Time 13.5 11.4 - 15.2 seconds   INR 1.0 0.8 - 1.2  TSH     Status: None   Collection Time: 09/03/23  6:21 PM  Result Value Ref Range   TSH 0.577 0.350 - 4.500 uIU/mL  Hemoglobin A1c     Status: None   Collection Time: 09/03/23  6:21 PM  Result Value Ref Range   Hgb A1c MFr Bld 5.6 4.8 - 5.6 %   Mean Plasma Glucose 114.02 mg/dL  T4, free     Status: None   Collection Time: 09/03/23  6:21 PM  Result Value Ref Range   Free T4 0.84 0.61 - 1.12 ng/dL  Surgical pcr screen     Status: Abnormal   Collection Time: 09/04/23 12:12 AM   Specimen: Nasal Mucosa; Nasal Swab  Result Value Ref Range   MRSA, PCR NEGATIVE NEGATIVE   Staphylococcus aureus POSITIVE (A) NEGATIVE  Comprehensive metabolic panel     Status: Abnormal   Collection Time: 09/04/23  4:55 AM  Result Value Ref Range   Sodium 134 (L) 135 - 145 mmol/L   Potassium 3.4 (L) 3.5 - 5.1 mmol/L   Chloride 101 98 - 111 mmol/L   CO2 24 22 - 32 mmol/L   Glucose, Bld 134 (H) 70 - 99 mg/dL   BUN 39 (H) 8 - 23 mg/dL   Creatinine, Ser 1.61 (H) 0.44 - 1.00 mg/dL   Calcium 8.5 (L) 8.9 - 10.3 mg/dL   Total Protein  5.4 (L) 6.5 - 8.1 g/dL   Albumin 3.2 (L) 3.5 - 5.0 g/dL   AST 16 15 - 41 U/L   ALT 14 0 - 44 U/L   Alkaline Phosphatase 54 38 - 126 U/L   Total Bilirubin 0.6 0.0 - 1.2 mg/dL   GFR, Estimated 42 (L) >60 mL/min   Anion gap 9 5 - 15  CBC     Status: Abnormal   Collection Time: 09/04/23  4:55 AM  Result Value Ref Range   WBC 9.0 4.0 - 10.5 K/uL   RBC 2.91 (L) 3.87 - 5.11 MIL/uL   Hemoglobin 8.5 (L) 12.0 - 15.0 g/dL   HCT 09.6 (L) 04.5 - 40.9 %   MCV 86.9 80.0 - 100.0 fL   MCH 29.2 26.0 - 34.0 pg   MCHC 33.6 30.0 - 36.0 g/dL   RDW 81.1 91.4 - 78.2 %   Platelets 201 150 - 400 K/uL   nRBC 0.0 0.0 - 0.2 %     Imaging or Labs ordered: None  Medical history and chart was reviewed and case discussed with medical provider.  Assessment/Plan: 78 year old female with right supracondylar distal femur fracture.  Due to the unstable nature of her injury I recommend proceeding with open reduction internal fixation.  Risks and benefits were discussed with the patient.  Risks include but not limited to bleeding, infection, malunion, nonunion, hardware failure, hardware rotation, nerve and  blood vessel injury, DVT, posttraumatic arthritis, even the possibility anesthetic complications.  She agreed to proceed with surgery and consent was obtained.  Denise Lofts, MD [facility] [phone] (office) [email or web address]

## 2023-09-04 NOTE — Plan of Care (Signed)

## 2023-09-04 NOTE — Discharge Instructions (Addendum)
 Orthopaedic Trauma Service Discharge Instructions   General Discharge Instructions  WEIGHT BEARING STATUS:weightbearing as tolerated  RANGE OF MOTION/ACTIVITY: ok for knee motion as tolerated  Wound Care: You may remove your surgical dressing on post op day 2, (Saturday 09/06/23). Incisions can be left open to air if there is no drainage. Once the incision is completely dry and without drainage, it may be left open to air out.  Showering may begin post op day 3, (Sunday 09/07/23).  Clean incision gently with soap and water.  DVT/PE prophylaxis:  Eliquis x 30 days  Diet: as you were eating previously.  Can use over the counter stool softeners and bowel preparations, such as Miralax, to help with bowel movements.  Narcotics can be constipating.  Be sure to drink plenty of fluids  PAIN MEDICATION USE AND EXPECTATIONS  You have likely been given narcotic medications to help control your pain.  After a traumatic event that results in an fracture (broken bone) with or without surgery, it is ok to use narcotic pain medications to help control one's pain.  We understand that everyone responds to pain differently and each individual patient will be evaluated on a regular basis for the continued need for narcotic medications. Ideally, narcotic medication use should last no more than 6-8 weeks (coinciding with fracture healing).   As a patient it is your responsibility as well to monitor narcotic medication use and report the amount and frequency you use these medications when you come to your office visit.   We would also advise that if you are using narcotic medications, you should take a dose prior to therapy to maximize you participation.  IF YOU ARE ON NARCOTIC MEDICATIONS IT IS NOT PERMISSIBLE TO OPERATE A MOTOR VEHICLE (MOTORCYCLE/CAR/TRUCK/MOPED) OR HEAVY MACHINERY DO NOT MIX NARCOTICS WITH OTHER CNS (CENTRAL NERVOUS SYSTEM) DEPRESSANTS SUCH AS ALCOHOL   STOP SMOKING OR USING NICOTINE  PRODUCTS!!!!  As discussed nicotine severely impairs your body's ability to heal surgical and traumatic wounds but also impairs bone healing.  Wounds and bone heal by forming microscopic blood vessels (angiogenesis) and nicotine is a vasoconstrictor (essentially, shrinks blood vessels).  Therefore, if vasoconstriction occurs to these microscopic blood vessels they essentially disappear and are unable to deliver necessary nutrients to the healing tissue.  This is one modifiable factor that you can do to dramatically increase your chances of healing your injury.    (This means no smoking, no nicotine gum, patches, etc)  DO NOT USE NONSTEROIDAL ANTI-INFLAMMATORY DRUGS (NSAID'S)  Using products such as Advil (ibuprofen), Aleve (naproxen), Motrin (ibuprofen) for additional pain control during fracture healing can delay and/or prevent the healing response.  If you would like to take over the counter (OTC) medication, Tylenol (acetaminophen) is ok.  However, some narcotic medications that are given for pain control contain acetaminophen as well. Therefore, you should not exceed more than 4000 mg of tylenol in a day if you do not have liver disease.  Also note that there are may OTC medicines, such as cold medicines and allergy medicines that my contain tylenol as well.  If you have any questions about medications and/or interactions please ask your doctor/PA or your pharmacist.      ICE AND ELEVATE INJURED/OPERATIVE EXTREMITY  Using ice and elevating the injured extremity above your heart can help with swelling and pain control.  Icing in a pulsatile fashion, such as 20 minutes on and 20 minutes off, can be followed.    Do not place ice  directly on skin. Make sure there is a barrier between to skin and the ice pack.    Using frozen items such as frozen peas works well as the conform nicely to the are that needs to be iced.  USE AN ACE WRAP OR TED HOSE FOR SWELLING CONTROL  In addition to icing and elevation,  Ace wraps or TED hose are used to help limit and resolve swelling.  It is recommended to use Ace wraps or TED hose until you are informed to stop.    When using Ace Wraps start the wrapping distally (farthest away from the body) and wrap proximally (closer to the body)   Example: If you had surgery on your leg or thing and you do not have a splint on, start the ace wrap at the toes and work your way up to the thigh        If you had surgery on your upper extremity and do not have a splint on, start the ace wrap at your fingers and work your way up to the upper arm   CALL THE OFFICE WITH ANY QUESTIONS OR CONCERNS: [phone]   VISIT OUR WEBSITE FOR ADDITIONAL INFORMATION: [email or web address]    Discharge Wound Care Instructions  Do NOT apply any ointments, solutions or lotions to pin sites or surgical wounds.  These prevent needed drainage and even though solutions like hydrogen peroxide kill bacteria, they also damage cells lining the pin sites that help fight infection.  Applying lotions or ointments can keep the wounds moist and can cause them to breakdown and open up as well. This can increase the risk for infection. When in doubt call the office.  Surgical incisions should be dressed daily.  If any drainage is noted, use one layer of adaptic or Mepitel, then gauze, Kerlix, and an ace wrap. - These dressing supplies should be available at local medical supply stores [place], [facility], etc) as well as Insurance claims handler ([facility], [facility], [place] [place], etc)  Once the incision is completely dry and without drainage, it may be left open to air out.  Showering may begin 36-48 hours later.  Cleaning gently with soap and water.  ===============================  Information on my medicine - ELIQUIS (apixaban)  This medication education was reviewed with me or my healthcare representative as part of my discharge preparation.     Why was Eliquis prescribed for you? Eliquis was  prescribed for you to reduce the risk of blood clots forming after orthopedic surgery.    What do You need to know about Eliquis? Take your Eliquis TWICE DAILY - one tablet in the morning and one tablet in the evening with or without food.  It would be best to take the dose about the same time each day.  If you have difficulty swallowing the tablet whole please discuss with your pharmacist how to take the medication safely.  Take Eliquis exactly as prescribed by your doctor and DO NOT stop taking Eliquis without talking to the doctor who prescribed the medication.  Stopping without other medication to take the place of Eliquis may increase your risk of developing a clot.  After discharge, you should have regular check-up appointments with your healthcare provider that is prescribing your Eliquis.  What do you do if you miss a dose? If a dose of ELIQUIS is not taken at the scheduled time, take it as soon as possible on the same day and twice-daily administration should be resumed.  The dose should not be doubled  to make up for a missed dose.  Do not take more than one tablet of ELIQUIS at the same time.  Important Safety Information A possible side effect of Eliquis is bleeding. You should call your healthcare provider right away if you experience any of the following: Bleeding from an injury or your nose that does not stop. Unusual colored urine (red or dark brown) or unusual colored stools (red or black). Unusual bruising for unknown reasons. A serious fall or if you hit your head (even if there is no bleeding).  Some medicines may interact with Eliquis and might increase your risk of bleeding or clotting while on Eliquis. To help avoid this, consult your healthcare provider or pharmacist prior to using any new prescription or non-prescription medications, including herbals, vitamins, non-steroidal anti-inflammatory drugs (NSAIDs) and supplements.  This website has more information  on Eliquis (apixaban): http://www.eliquis.com/eliquis/home

## 2023-09-04 NOTE — Progress Notes (Incomplete)
S/P ORIF of right distal femur fracture, 2/20   09/04/23 1503  TOC Brief Assessment  Insurance and Status Reviewed  Patient has primary care physician No (pt without documented PCP, TOC team to f/u)  Home environment has been reviewed From home with family.  Prior level of function: PTA independent with ADL's. Pt with cane , RW @ home.  Prior/Current Home Services No current home services  Social Drivers of Health Review SDOH reviewed no interventions necessary  Readmission risk has been reviewed No  Transition of care needs transition of care needs identified, TOC will continue to follow   PT/OT evaluations pending.  Gae Gallop RN,BSN,CM [phone]

## 2023-09-04 NOTE — Progress Notes (Signed)
Patient arrived to unit from PACU she is alert and verbally complains of allergies. Continous cardiac monitor placed and tele called, ice pack to surgical site on, continuous pulse on, scds placed on and plugged in to wall, pt on room air, vitals taken, bed placed in low position, wheels locked, call bell within reach, son in law present at bedside.

## 2023-09-04 NOTE — Anesthesia Procedure Notes (Signed)
Procedure Name: Intubation Date/Time: 09/04/2023 7:50 AM  Performed by: Yolonda Kida, CRNAPre-anesthesia Checklist: Patient identified, Emergency Drugs available, Suction available and Patient being monitored Patient Re-evaluated:Patient Re-evaluated prior to induction Oxygen Delivery Method: Circle System Utilized Preoxygenation: Pre-oxygenation with 100% oxygen Induction Type: IV induction Ventilation: Mask ventilation without difficulty Laryngoscope Size: Mac and 3 Grade View: Grade I Tube type: Oral Tube size: 7.0 mm Number of attempts: 1 Airway Equipment and Method: Stylet Placement Confirmation: ETT inserted through vocal cords under direct vision, positive ETCO2 and breath sounds checked- equal and bilateral Secured at: 22 cm Tube secured with: Tape Dental Injury: Teeth and Oropharynx as per pre-operative assessment

## 2023-09-04 NOTE — Progress Notes (Signed)
Liana Crocker NP triad hospitalist notified via chat bladder scanned for volume 438.

## 2023-09-04 NOTE — Consult Note (Signed)
Orthopaedic Trauma Service (OTS) Consult   Patient ID: Denise Gonzales MRN: [id number] DOB/AGE: 1946/05/18 78 y.o.  Reason for Consult:Right supracondylar distal femur Referring Physician: Dr. Dorthula Nettles, MD Delbert Harness  HPI: Denise Gonzales is an 78 y.o. female who is being seen in consultation at the request of Dr. Dion Saucier for evaluation of right supracondylar distal femur fracture.  Patient had a fall sustaining the above injury.  She presented to med [facility] at which point she was transferred here to [facility] to undergo surgical evaluation.  Due to complexity of her fracture Dr. Dion Saucier felt that this would best be treated with an orthopedic traumatologist.  Patient was seen and evaluated in the preoperative holding area.  Her daughter is at bedside.  She lives at home with her husband as well as her grandson she ambulates mostly without assist device occasionally uses a cane or a walker.  She denies any history of diabetes denies any other injuries.  Past Medical History:  Diagnosis Date   Asthma    DJD (degenerative joint disease)    GERD (gastroesophageal reflux disease)    History of PSVT (paroxysmal supraventricular tachycardia)    Hyperlipidemia    Hypertension    Osteopenia     Past Surgical History:  Procedure Laterality Date   BACK SURGERY  1990 and 2004   BREAST BIOPSY Right 11/20/2022   [place] RT BREAST BX W LOC DEV 1ST LESION IMG BX SPEC US GUIDE 11/20/2022 [facility]   BREAST EXCISIONAL BIOPSY     BREAST LUMPECTOMY     CERVICAL POLYPECTOMY      Family History  Problem Relation Age of Onset   Asthma Sister    Allergies Sister    Breast cancer Sister    Colon cancer Other        neice at 78 years old   Hypertension Other        multiple family members   Kidney failure Father     Social History:  reports that she has never smoked. She has never used smokeless tobacco. She reports that she does not drink alcohol and does not use drugs.  Allergies:   Allergies  Allergen Reactions   Bactrim [Sulfamethoxazole-Trimethoprim] Other (See Comments)    Pt felt like she was having a heart attack.     Medications:  No current facility-administered medications on file prior to encounter.   Current Outpatient Medications on File Prior to Encounter  Medication Sig Dispense Refill   albuterol (PROAIR HFA) 108 (90 Base) MCG/ACT inhaler Inhale 2 puffs into the lungs every 4 (four) hours as needed. 18 each 1   b complex vitamins tablet Take 1 tablet by mouth daily.     Multiple Vitamins-Minerals (CENTRUM SILVER PO) Take 1 tablet by mouth daily.     Omega-3 Fatty Acids (FISH OIL PO) Take 1 tablet by mouth daily.     OVER THE COUNTER MEDICATION Osteospam (supplement for bone health) 3 daily     atorvastatin (LIPITOR) 20 MG tablet Take 1 tablet by mouth daily.     diltiazem (CARDIZEM CD) 240 MG 24 hr capsule Take 240 mg by mouth daily.     fluticasone (FLONASE) 50 MCG/ACT nasal spray Place 2 sprays into both nostrils daily as needed for allergies or rhinitis. 1 g 5   fluticasone-salmeterol (ADVAIR) 100-50 MCG/ACT AEPB INHALE ONE PUFF BY MOUTH TWICE A DAY 60 each 0   hydrochlorothiazide (HYDRODIURIL) 12.5 MG tablet Take 12.5 mg by mouth  daily.     lidocaine (LIDODERM) 5 % Place 1 patch onto the skin daily. Remove & Discard patch within 12 hours or as directed by MD (Patient not taking: Reported on 09/03/2023) 14 patch 0   lisinopril (PRINIVIL,ZESTRIL) 40 MG tablet Take 40 mg by mouth daily.     loratadine (CLARITIN) 10 MG tablet Take 10 mg by mouth daily.     montelukast (SINGULAIR) 10 MG tablet Take 1 tablet (10 mg total) by mouth daily. 30 tablet 11   Vitamin D, Ergocalciferol, (DRISDOL) 1.25 MG (50000 UNIT) CAPS capsule Take 50,000 Units by mouth once a week. (Patient not taking: Reported on 09/03/2023)       ROS: Constitutional: No fever or chills Vision: No changes in vision ENT: No difficulty swallowing CV: No chest pain Pulm: No SOB or  wheezing GI: No nausea or vomiting GU: No urgency or inability to hold urine Skin: No poor wound healing Neurologic: No numbness or tingling Psychiatric: No depression or anxiety Heme: No bruising Allergic: No reaction to medications or food   Exam: Blood pressure 102/70, pulse 71, temperature 97.9 F (36.6 C), temperature source Oral, resp. rate 18, height 5\' 2"  (1.575 m), weight 50.8 kg, SpO2 97%. General: No acute distress Orientation: Awake alert and oriented x 3 Mood and Affect: Cooperative and pleasant Gait: Unable to assess due to her fracture Coordination and balance: Within normal limits  Right lower extremity: Knee immobilizer is in place.  Compartments are soft compressible.  She has active dorsiflexion plantarflexion of foot and ankle.  She is warm well-perfused foot with 2+ DP and PT pulses.  She endorses sensation to the entirety of her foot.  She has moderate swelling to her knee no previous surgical incisions.  No significant deformity to her leg.  Left lower extremity: Skin without lesions. No tenderness to palpation. Full painless ROM, full strength in each muscle groups without evidence of instability.   Medical Decision Making: Data: Imaging: X-rays and CT scan have been reviewed which shows an extra-articular supracondylar distal femur fracture.  Labs:  Results for orders placed or performed during the hospital encounter of 09/03/23 (from the past 24 hours)  Basic metabolic panel     Status: Abnormal   Collection Time: 09/03/23 12:01 PM  Result Value Ref Range   Sodium 134 (L) 135 - 145 mmol/L   Potassium 3.7 3.5 - 5.1 mmol/L   Chloride 101 98 - 111 mmol/L   CO2 22 22 - 32 mmol/L   Glucose, Bld 136 (H) 70 - 99 mg/dL   BUN 32 (H) 8 - 23 mg/dL   Creatinine, Ser 3.87 0.44 - 1.00 mg/dL   Calcium 8.8 (L) 8.9 - 10.3 mg/dL   GFR, Estimated >56 >43 mL/min   Anion gap 11 5 - 15  CBC with Differential     Status: Abnormal   Collection Time: 09/03/23 12:01 PM   Result Value Ref Range   WBC 12.4 (H) 4.0 - 10.5 K/uL   RBC 3.71 (L) 3.87 - 5.11 MIL/uL   Hemoglobin 10.6 (L) 12.0 - 15.0 g/dL   HCT 32.9 (L) 51.8 - 84.1 %   MCV 86.5 80.0 - 100.0 fL   MCH 28.6 26.0 - 34.0 pg   MCHC 33.0 30.0 - 36.0 g/dL   RDW 66.0 63.0 - 16.0 %   Platelets 250 150 - 400 K/uL   nRBC 0.0 0.0 - 0.2 %   Neutrophils Relative % 83 %   Neutro Abs 10.3 (H) 1.7 -  7.7 K/uL   Lymphocytes Relative 10 %   Lymphs Abs 1.3 0.7 - 4.0 K/uL   Monocytes Relative 5 %   Monocytes Absolute 0.7 0.1 - 1.0 K/uL   Eosinophils Relative 1 %   Eosinophils Absolute 0.1 0.0 - 0.5 K/uL   Basophils Relative 1 %   Basophils Absolute 0.1 0.0 - 0.1 K/uL   Immature Granulocytes 0 %   Abs Immature Granulocytes 0.04 0.00 - 0.07 K/uL  Protime-INR     Status: None   Collection Time: 09/03/23 12:01 PM  Result Value Ref Range   Prothrombin Time 13.5 11.4 - 15.2 seconds   INR 1.0 0.8 - 1.2  TSH     Status: None   Collection Time: 09/03/23  6:21 PM  Result Value Ref Range   TSH 0.577 0.350 - 4.500 uIU/mL  Hemoglobin A1c     Status: None   Collection Time: 09/03/23  6:21 PM  Result Value Ref Range   Hgb A1c MFr Bld 5.6 4.8 - 5.6 %   Mean Plasma Glucose 114.02 mg/dL  T4, free     Status: None   Collection Time: 09/03/23  6:21 PM  Result Value Ref Range   Free T4 0.84 0.61 - 1.12 ng/dL  Surgical pcr screen     Status: Abnormal   Collection Time: 09/04/23 12:12 AM   Specimen: Nasal Mucosa; Nasal Swab  Result Value Ref Range   MRSA, PCR NEGATIVE NEGATIVE   Staphylococcus aureus POSITIVE (A) NEGATIVE  Comprehensive metabolic panel     Status: Abnormal   Collection Time: 09/04/23  4:55 AM  Result Value Ref Range   Sodium 134 (L) 135 - 145 mmol/L   Potassium 3.4 (L) 3.5 - 5.1 mmol/L   Chloride 101 98 - 111 mmol/L   CO2 24 22 - 32 mmol/L   Glucose, Bld 134 (H) 70 - 99 mg/dL   BUN 39 (H) 8 - 23 mg/dL   Creatinine, Ser 1.61 (H) 0.44 - 1.00 mg/dL   Calcium 8.5 (L) 8.9 - 10.3 mg/dL   Total Protein  5.4 (L) 6.5 - 8.1 g/dL   Albumin 3.2 (L) 3.5 - 5.0 g/dL   AST 16 15 - 41 U/L   ALT 14 0 - 44 U/L   Alkaline Phosphatase 54 38 - 126 U/L   Total Bilirubin 0.6 0.0 - 1.2 mg/dL   GFR, Estimated 42 (L) >60 mL/min   Anion gap 9 5 - 15  CBC     Status: Abnormal   Collection Time: 09/04/23  4:55 AM  Result Value Ref Range   WBC 9.0 4.0 - 10.5 K/uL   RBC 2.91 (L) 3.87 - 5.11 MIL/uL   Hemoglobin 8.5 (L) 12.0 - 15.0 g/dL   HCT 09.6 (L) 04.5 - 40.9 %   MCV 86.9 80.0 - 100.0 fL   MCH 29.2 26.0 - 34.0 pg   MCHC 33.6 30.0 - 36.0 g/dL   RDW 81.1 91.4 - 78.2 %   Platelets 201 150 - 400 K/uL   nRBC 0.0 0.0 - 0.2 %     Imaging or Labs ordered: None  Medical history and chart was reviewed and case discussed with medical provider.  Assessment/Plan: 78 year old female with right supracondylar distal femur fracture.  Due to the unstable nature of her injury I recommend proceeding with open reduction internal fixation.  Risks and benefits were discussed with the patient.  Risks include but not limited to bleeding, infection, malunion, nonunion, hardware failure, hardware rotation, nerve and  blood vessel injury, DVT, posttraumatic arthritis, even the possibility anesthetic complications.  She agreed to proceed with surgery and consent was obtained.  Roby Lofts, MD [facility] [phone] (office) [email or web address]

## 2023-09-04 NOTE — Transfer of Care (Signed)
Immediate Anesthesia Transfer of Care Note  Patient: Denise Gonzales  Procedure(s) Performed: OPEN REDUCTION INTERNAL FIXATION (ORIF) DISTAL FEMUR FRACTURE (Right)  Patient Location: PACU  Anesthesia Type:General  Level of Consciousness: awake  Airway & Oxygen Therapy: Patient Spontanous Breathing and Patient connected to nasal cannula oxygen  Post-op Assessment: Report given to RN and Post -op Vital signs reviewed and stable  Post vital signs: Reviewed and stable  Last Vitals:  Vitals Value Taken Time  BP 160/62 09/04/23 0854  Temp    Pulse 92 09/04/23 0857  Resp 23 09/04/23 0857  SpO2 100 % 09/04/23 0857  Vitals shown include unfiled device data.  Last Pain:  Vitals:   09/04/23 0639  TempSrc: Oral  PainSc:       Patients Stated Pain Goal: 4 (09/03/23 1624)  Complications: No notable events documented.

## 2023-09-04 NOTE — Progress Notes (Signed)
Transition of Care [facility]) - CAGE-AID Screening   Patient Details  Name: Denise Gonzales MRN: [id number] Date of Birth: 10/01/1945  [facility] of [facility]) CM/SW Contact:    Katha Hamming, RN Phone Number: 09/04/2023, 10:53 PM   CAGE-AID Screening:    Have You Ever Felt You Ought to Cut Down on Your Drinking or Drug Use?: No Have People Annoyed You By Critizing Your Drinking Or Drug Use?: No Have You Felt Bad Or Guilty About Your Drinking Or Drug Use?: No Have You Ever Had a Drink or Used Drugs First Thing In The Morning to Steady Your Nerves or to Get Rid of a Hangover?: No CAGE-AID Score: 0  Substance Abuse Education Offered: No (No drug/alcohol use)

## 2023-09-04 NOTE — Evaluation (Signed)
Physical Therapy Evaluation Patient Details Name: Denise Gonzales MRN: [id number] DOB: [birth year] Today's Date: 09/04/2023  History of Present Illness  Patient is 78 y.o. female presented 09/03/23 to [facility] after a fall resulting in Rt supracondylar distal femur fracture. Pt s/p Rt ORIF on 09/04/23 and is WBAT. PMH significant for asthma, GERD, PSVT, HTN, HLD, osteopenia.   Clinical Impression  Denise Gonzales is 78 y.o. female admitted with above HPI and diagnosis. Patient is currently limited by functional impairments below (see PT problem list). Patient lives with family and is independent with no AD at baseline. Currently pt requires min assist with cues for sequencing transfers with RW. Pt greatly limited by pain in Rt LE with weight bearing and limited by UE weakness in use of RW to reduce pressure on Rt LE. Patient will benefit from continued skilled PT interventions to address impairments and progress independence with mobility. Patient will benefit from continued inpatient follow up therapy, <3 hours/day, will update recs as able if pt progresses. Acute PT will follow and progress as able.         If plan is discharge home, recommend the following: Two people to help with walking and/or transfers;A lot of help with bathing/dressing/bathroom;Assistance with cooking/housework;Direct supervision/assist for medications management;Assist for transportation;Help with stairs or ramp for entrance   Can travel by private vehicle   Yes    Equipment Recommendations Rolling walker (2 wheels);BSC/3in1 (youth RW)  Recommendations for Other Services       Functional Status Assessment Patient has had a recent decline in their functional status and demonstrates the ability to make significant improvements in function in a reasonable and predictable amount of time.     Precautions / Restrictions Precautions Precautions: Fall Recall of Precautions/Restrictions: Intact Restrictions Weight Bearing Restrictions  Per Provider Order: Yes RLE Weight Bearing Per Provider Order: Weight bearing as tolerated      Mobility  Bed Mobility Overal bed mobility: Needs Assistance             General bed mobility comments: pt transferring BSC>bed and sitting EOB on arrival    Transfers Overall transfer level: Needs assistance Equipment used: Rolling walker (2 wheels) Transfers: Sit to/from Stand, Bed to chair/wheelchair/BSC Sit to Stand: Min assist Stand pivot transfers: Min assist         General transfer comment: cues for hand placement with power up, min assist to steady rise. pt unable to reduce pressure on Rt LE sufficiently with use of RW. Demonstration required with verbal cues for sequencing stand pivot by scooting on Lt LE for bed>recliner. pt unable to move towards Rt and transfer towards strong Lt side.    Ambulation/Gait                  Stairs            Wheelchair Mobility     Tilt Bed    Modified Rankin (Stroke Patients Only)       Balance Overall balance assessment: Needs assistance Sitting-balance support: Feet supported Sitting balance-Leahy Scale: Fair     Standing balance support: During functional activity, Reliant on assistive device for balance, Bilateral upper extremity supported Standing balance-Leahy Scale: Poor                               Pertinent Vitals/Pain Pain Assessment Pain Assessment: Faces Faces Pain Scale: Hurts whole lot Pain Location: Rt knee Pain Descriptors / Indicators: Aching,  Discomfort, Guarding, Operative site guarding Pain Intervention(s): Limited activity within patient's tolerance, Monitored during session, Premedicated before session, Repositioned, Ice applied    Home Living Family/patient expects to be discharged to:: Private residence Living Arrangements: Spouse/significant other;Other relatives (grandson) Available Help at Discharge: Family Type of Home: House Home Access: Stairs to  enter Entrance Stairs-Rails: None Entrance Stairs-Number of Steps: 1 Alternate Level Stairs-Number of Steps: 12-16 Home Layout: One level;Multi-level Home Equipment: None      Prior Function Prior Level of Function : Independent/Modified Independent;Driving;Working/employed                     Extremity/Trunk Assessment   Upper Extremity Assessment Upper Extremity Assessment: Defer to OT evaluation;Generalized weakness    Lower Extremity Assessment Lower Extremity Assessment: Generalized weakness    Cervical / Trunk Assessment Cervical / Trunk Assessment: Normal  Communication   Communication Communication: No apparent difficulties    Cognition Arousal: Alert Behavior During Therapy: WFL for tasks assessed/performed   PT - Cognitive impairments: No apparent impairments                         Following commands: Intact       Cueing Cueing Techniques: Verbal cues, Visual cues     General Comments      Exercises     Assessment/Plan    PT Assessment Patient needs continued PT services  PT Problem List Decreased strength;Decreased range of motion;Decreased activity tolerance;Decreased balance;Decreased mobility;Decreased knowledge of use of DME;Decreased safety awareness;Decreased knowledge of precautions;Pain       PT Treatment Interventions DME instruction;Gait training;Stair training;Functional mobility training;Therapeutic activities;Therapeutic exercise;Balance training;Neuromuscular re-education;Cognitive remediation;Patient/family education;Wheelchair mobility training    PT Goals (Current goals can be found in the Care Plan section)  Acute Rehab PT Goals Patient Stated Goal: regain mobility, stop hurting PT Goal Formulation: With patient Time For Goal Achievement: 09/18/23 Potential to Achieve Goals: Good    Frequency Min 1X/week     Co-evaluation               AM-PAC PT "6 Clicks" Mobility  Outcome Measure Help needed  turning from your back to your side while in a flat bed without using bedrails?: A Little Help needed moving from lying on your back to sitting on the side of a flat bed without using bedrails?: A Little Help needed moving to and from a bed to a chair (including a wheelchair)?: A Little Help needed standing up from a chair using your arms (e.g., wheelchair or bedside chair)?: A Little Help needed to walk in hospital room?: Total Help needed climbing 3-5 steps with a railing? : Total 6 Click Score: 14    End of Session Equipment Utilized During Treatment: Gait belt Activity Tolerance: Patient tolerated treatment well Patient left: in chair;with call bell/phone within reach;with chair alarm set;with family/visitor present Nurse Communication: Mobility status PT Visit Diagnosis: Unsteadiness on feet (R26.81);Other abnormalities of gait and mobility (R26.89);Muscle weakness (generalized) (M62.81);Difficulty in walking, not elsewhere classified (R26.2);Pain Pain - Right/Left: Right Pain - part of body: Knee    Time: 1410-1444 PT Time Calculation (min) (ACUTE ONLY): 34 min   Charges:   PT Evaluation $PT Eval Moderate Complexity: 1 Mod PT Treatments $Therapeutic Activity: 8-22 mins PT General Charges $$ ACUTE PT VISIT: 1 Visit         Wynn Maudlin, DPT Acute Rehabilitation Services Office [phone]  09/04/23 2:55 PM

## 2023-09-04 NOTE — Progress Notes (Signed)
PROGRESS NOTE    Denise Gonzales  ZOX:[id number] DOB: 1946/03/08 DOA: 09/03/2023 PCP: Pcp, No   Brief Narrative: 78 year old with past medical history significant for allergies to Bactrim, asthma, hypertension, GERD, hyperlipidemia, osteopenia present with med [facility] after a fall.  Patient tripped and landed on her right side.  She was unable to move her leg or extended.  Evaluation in the ED CT knee showed fracture of the femur.  Comminuted extra-articular fracture of the distal femoral metaphysis with substantial comminution.   Assessment & Plan:   Principal Problem:   Other fracture of right femur, initial encounter for closed fracture [facility]) Active Problems:   HTN (hypertension)   Fall at home, initial encounter  1-Right distal femur fracture: -Presented after a fall found to have right distal femoral fracture -Underwent open reduction internal fixation of right distal femur fracture by Dr. Jena Gauss 2 2/20 -Pain management with hydrocodone as needed -Scheduled laxative -PT OT per orthopedic -IV Fluids  COPD; Stable. She prefer to use home inhaler, Advair. Order in. Continue with Singulair PRN albuterol.  HTN; Continue with Cardizem and lisinopril.  With hold hydrochlorothiazide for now  Fall: -Will need PT OT eval  Anemia: -Check anemia panel in a.m.  Leukocytosis: -WBC on admission at 12, trending down. 10 -Chest X ray: No acute finding.   Hypokalemia -Replete orally.       Estimated body mass index is 20.49 kg/m as calculated from the following:   Height as of this encounter: 5\' 2"  (1.575 m).   Weight as of this encounter: 50.8 kg.   DVT prophylaxis: Lovenox Code Status: Full code Family Communication: Care discussed with patient and daughter who was at bedside Disposition Plan:  Status is: Inpatient Remains inpatient appropriate because: Management postop femur fracture    Consultants:  Dr Jena Gauss  Procedures:  none  Antimicrobials:     Subjective: She came from sx. Denies dyspnea. Home meds review with daughter   Objective: Vitals:   09/04/23 0000 09/04/23 0435 09/04/23 0600 09/04/23 0639  BP: (!) 115/50 (!) 105/39  102/70  Pulse:  72  71  Resp: 18   18  Temp: 98.6 F (37 C)  98 F (36.7 C) 97.9 F (36.6 C)  TempSrc: Oral  Oral Oral  SpO2: 98% 97%  97%  Weight:    50.8 kg  Height:    5\' 2"  (1.575 m)    Intake/Output Summary (Last 24 hours) at 09/04/2023 0813 Last data filed at 09/04/2023 0747 Gross per 24 hour  Intake 100 ml  Output --  Net 100 ml   Filed Weights   09/03/23 1054 09/03/23 1622 09/04/23 0639  Weight: 51.3 kg 54.7 kg 50.8 kg    Examination:  General exam: Appears calm and comfortable  Respiratory system: Clear to auscultation. Respiratory effort normal. Cardiovascular system: S1 & S2 heard, RRR.  Gastrointestinal system: Abdomen is nondistended, soft and nontender. No organomegaly or masses felt. Normal bowel sounds heard. Central nervous system: Alert and oriented. No focal neurological deficits. Extremities: Symmetric 5 x 5 power.    Data Reviewed: I have personally reviewed following labs and imaging studies  CBC: Recent Labs  Lab 09/03/23 1201 09/04/23 0455  WBC 12.4* 9.0  NEUTROABS 10.3*  --   HGB 10.6* 8.5*  HCT 32.1* 25.3*  MCV 86.5 86.9  PLT 250 201   Basic Metabolic Panel: Recent Labs  Lab 09/03/23 1201 09/04/23 0455  NA 134* 134*  K 3.7 3.4*  CL 101 101  CO2 22 24  GLUCOSE 136* 134*  BUN 32* 39*  CREATININE 0.77 1.32*  CALCIUM 8.8* 8.5*   GFR: Estimated Creatinine Clearance: 28.2 mL/min (A) (by C-G formula based on SCr of 1.32 mg/dL (H)). Liver Function Tests: Recent Labs  Lab 09/04/23 0455  AST 16  ALT 14  ALKPHOS 54  BILITOT 0.6  PROT 5.4*  ALBUMIN 3.2*   No results for input(s): "LIPASE", "AMYLASE" in the last 168 hours. No results for input(s): "AMMONIA" in the last 168 hours. Coagulation Profile: Recent Labs  Lab  09/03/23 1201  INR 1.0   Cardiac Enzymes: No results for input(s): "CKTOTAL", "CKMB", "CKMBINDEX", "TROPONINI" in the last 168 hours. BNP (last 3 results) No results for input(s): "PROBNP" in the last 8760 hours. HbA1C: Recent Labs    09/03/23 1821  HGBA1C 5.6   CBG: No results for input(s): "GLUCAP" in the last 168 hours. Lipid Profile: No results for input(s): "CHOL", "HDL", "LDLCALC", "TRIG", "CHOLHDL", "LDLDIRECT" in the last 72 hours. Thyroid Function Tests: Recent Labs    09/03/23 1821  TSH 0.577  FREET4 0.84   Anemia Panel: No results for input(s): "VITAMINB12", "FOLATE", "FERRITIN", "TIBC", "IRON", "RETICCTPCT" in the last 72 hours. Sepsis Labs: No results for input(s): "PROCALCITON", "LATICACIDVEN" in the last 168 hours.  Recent Results (from the past 240 hours)  Surgical pcr screen     Status: Abnormal   Collection Time: 09/04/23 12:12 AM   Specimen: Nasal Mucosa; Nasal Swab  Result Value Ref Range Status   MRSA, PCR NEGATIVE NEGATIVE Final   Staphylococcus aureus POSITIVE (A) NEGATIVE Final    Comment: (NOTE) The Xpert SA Assay (FDA approved for NASAL specimens in patients 42 years of age and older), is one component of a comprehensive surveillance program. It is not intended to diagnose infection nor to guide or monitor treatment. Performed at [facility], [address], [place], [place] [address]          Radiology Studies: DG Chest 2 View Result Date: 09/03/2023 CLINICAL DATA:  Preop chest exam.  Femur fracture. EXAM: CHEST - 2 VIEW COMPARISON:  04/23/2017 FINDINGS: The cardiomediastinal contours are normal. Chronic coarsened lung markings. Pulmonary vasculature is normal. No consolidation, pleural effusion, or pneumothorax. No acute osseous abnormalities are seen. The bones are diffusely under mineralized. IMPRESSION: No acute findings. Electronically Signed   By: Narda Rutherford M.D.   On: 09/03/2023 21:00   CT Knee Right Wo  Contrast Result Date: 09/03/2023 CLINICAL DATA:  Comminuted fracture distal femur EXAM: CT OF THE RIGHT KNEE WITHOUT CONTRAST TECHNIQUE: Multidetector CT imaging of the right knee was performed according to the standard protocol. Multiplanar CT image reconstructions were also generated. RADIATION DOSE REDUCTION: This exam was performed according to the departmental dose-optimization program which includes automated exposure control, adjustment of the mA and/or kV according to patient size and/or use of iterative reconstruction technique. COMPARISON:  Radiographs 09/03/2023 FINDINGS: Bones/Joint/Cartilage Bony demineralization. OTA 33 A3 extra-articular fracture of the distal femoral metaphysis with substantial comminution. Although components of the fracture extend close to the articular surface, for example anterolaterally near the margin of the lateral femoral trochlear groove, and posteriorly towards the intercondylar notch, no definite extension to the weight-bearing surfaces or definite involvement of the trochlear articular surface is observed. Meniscal chondrocalcinosis compatible with CPPD arthropathy. Ligaments Suboptimally assessed by CT. Muscles and Tendons Edema tracks along fascia planes in the vicinity of the fractures, including along the vastus musculature Soft tissues Infiltrative edema in the popliteal  space especially superiorly. IMPRESSION: 1. Comminuted extra-articular fracture of the distal femoral metaphysis with substantial comminution. Although components of the fracture extend close to the articular surface, no definite extension to the weight-bearing surfaces or definite involvement of the trochlear articular surface is observed. 2. Meniscal chondrocalcinosis compatible with CPPD arthropathy. 3. Bony demineralization. 4. Infiltrative edema in the popliteal space especially superiorly. Electronically Signed   By: Gaylyn Rong M.D.   On: 09/03/2023 13:18   DG Knee Complete 4 Views  Right Result Date: 09/03/2023 CLINICAL DATA:  Fall.  Right knee pain and swelling. EXAM: RIGHT KNEE - COMPLETE 4+ VIEW COMPARISON:  None Available. FINDINGS: There is acute, comminuted angulated fracture of the distal right femur. No discrete intra-articular extension seen. No other acute fracture or dislocation. No aggressive osseous lesion. There are degenerative changes of the knee joint in the form of mildly reduced tibio-femoral compartment joint space and osteophytosis. No knee effusion or focal soft tissue swelling. No radiopaque foreign bodies. IMPRESSION: *Acute comminuted fractures of the distal right femur. Electronically Signed   By: Jules Schick M.D.   On: 09/03/2023 12:13   CT Cervical Spine Wo Contrast Result Date: 09/03/2023 CLINICAL DATA:  78 year old female status post trip and fall this morning. Pain and swelling. EXAM: CT CERVICAL SPINE WITHOUT CONTRAST TECHNIQUE: Multidetector CT imaging of the cervical spine was performed without intravenous contrast. Multiplanar CT image reconstructions were also generated. RADIATION DOSE REDUCTION: This exam was performed according to the departmental dose-optimization program which includes automated exposure control, adjustment of the mA and/or kV according to patient size and/or use of iterative reconstruction technique. COMPARISON:  Head CT today.  CTA head and neck 12/27/2019. FINDINGS: Alignment: Chronic straightening of cervical lordosis. Cervicothoracic junction alignment is within normal limits. Bilateral posterior element alignment is within normal limits. Skull base and vertebrae: Stable bone mineralization since 2021. Visualized skull base is intact. No atlanto-occipital dissociation. C1 and C2 appear intact and aligned. No acute osseous abnormality identified. Soft tissues and spinal canal: No prevertebral fluid or swelling. No visible canal hematoma. Negative visible noncontrast neck soft tissues aside from calcified carotid  atherosclerosis. Disc levels: Chronic cervical spine degeneration appears stable since 2021, fairly age-appropriate. Upper chest: Upper thoracic vertebrae appear intact. Chronic apical lung scarring is stable. IMPRESSION: 1. No acute traumatic injury identified in the cervical spine. 2. Stable cervical spine degeneration since 2021. Electronically Signed   By: Odessa Fleming M.D.   On: 09/03/2023 11:46   CT Head Wo Contrast Result Date: 09/03/2023 CLINICAL DATA:  78 year old female status post trip and fall this morning. Pain and swelling. EXAM: CT HEAD WITHOUT CONTRAST TECHNIQUE: Contiguous axial images were obtained from the base of the skull through the vertex without intravenous contrast. RADIATION DOSE REDUCTION: This exam was performed according to the departmental dose-optimization program which includes automated exposure control, adjustment of the mA and/or kV according to patient size and/or use of iterative reconstruction technique. COMPARISON:  Head CT 12/27/2019. FINDINGS: Brain: Cerebral volume loss since 2021 appears to be generalized. No midline shift, ventriculomegaly, mass effect, evidence of mass lesion, intracranial hemorrhage or evidence of cortically based acute infarction. Chronic but progressed, Patchy and confluent bilateral cerebral white matter hypodensity, moderate for age. Vascular: No suspicious intracranial vascular hyperdensity. Calcified atherosclerosis at the skull base. Skull: Stable, intact. Sinuses/Orbits: Visualized paranasal sinuses and mastoids are clear. Other: Visualized orbits and scalp soft tissues are within normal limits. IMPRESSION: 1. No acute intracranial abnormality or acute traumatic injury identified. 2. Generalized cerebral  volume loss and progressed chronic white matter disease since 2021. Electronically Signed   By: Odessa Fleming M.D.   On: 09/03/2023 11:43        Scheduled Meds:  [MAR Hold] Chlorhexidine Gluconate Cloth  6 each Topical Daily   [MAR Hold]  hydrochlorothiazide  12.5 mg Oral Daily   [MAR Hold] lisinopril  40 mg Oral Daily   [MAR Hold] loratadine  10 mg Oral Daily   [MAR Hold] montelukast  10 mg Oral Daily   [MAR Hold] mupirocin ointment  1 Application Nasal BID   [MAR Hold] sodium chloride flush  3 mL Intravenous Q12H   Continuous Infusions:  sodium chloride 50 mL/hr at 09/04/23 0739    ceFAZolin (ANCEF) IV       LOS: 1 day    Time spent: 35 minutes     Velva Molinari A Cordale Manera, MD [facility]   If 7PM-7AM, please contact night-coverage [email or web address]  09/04/2023, 8:13 AM

## 2023-09-04 NOTE — Anesthesia Postprocedure Evaluation (Signed)
Anesthesia Post Note  Patient: Denise Gonzales  Procedure(s) Performed: OPEN REDUCTION INTERNAL FIXATION (ORIF) DISTAL FEMUR FRACTURE (Right)     Patient location during evaluation: PACU Anesthesia Type: General Level of consciousness: sedated and patient cooperative Pain management: pain level controlled Vital Signs Assessment: post-procedure vital signs reviewed and stable Respiratory status: spontaneous breathing Cardiovascular status: stable Anesthetic complications: no   No notable events documented.  Last Vitals:  Vitals:   09/04/23 1000 09/04/23 1246  BP: (!) 149/68 (!) 152/78  Pulse: 84 93  Resp: 17 17  Temp: 36.4 C   SpO2: 98% 93%    Last Pain:  Vitals:   09/04/23 1200  TempSrc:   PainSc: 7                  Lewie Loron

## 2023-09-05 ENCOUNTER — Encounter (HOSPITAL_COMMUNITY): Payer: Self-pay | Admitting: Student

## 2023-09-05 DIAGNOSIS — S728X1A Other fracture of right femur, initial encounter for closed fracture: Secondary | ICD-10-CM | POA: Diagnosis not present

## 2023-09-05 LAB — RETICULOCYTES
Immature Retic Fract: 3 % (ref 2.3–15.9)
RBC.: 2.53 MIL/uL — ABNORMAL LOW (ref 3.87–5.11)
Retic Count, Absolute: 34.4 10*3/uL (ref 19.0–186.0)
Retic Ct Pct: 1.4 % (ref 0.4–3.1)

## 2023-09-05 LAB — BASIC METABOLIC PANEL
Anion gap: 11 (ref 5–15)
BUN: 25 mg/dL — ABNORMAL HIGH (ref 8–23)
CO2: 23 mmol/L (ref 22–32)
Calcium: 8.9 mg/dL (ref 8.9–10.3)
Chloride: 104 mmol/L (ref 98–111)
Creatinine, Ser: 1.01 mg/dL — ABNORMAL HIGH (ref 0.44–1.00)
GFR, Estimated: 57 mL/min — ABNORMAL LOW (ref >60)
Glucose, Bld: 145 mg/dL — ABNORMAL HIGH (ref 70–99)
Potassium: 4.4 mmol/L (ref 3.5–5.1)
Sodium: 138 mmol/L (ref 135–145)

## 2023-09-05 LAB — CBC
HCT: 22 % — ABNORMAL LOW (ref 36.0–46.0)
Hemoglobin: 7.3 g/dL — ABNORMAL LOW (ref 12.0–15.0)
MCH: 29 pg (ref 26.0–34.0)
MCHC: 33.2 g/dL (ref 30.0–36.0)
MCV: 87.3 fL (ref 80.0–100.0)
Platelets: 174 10*3/uL (ref 150–400)
RBC: 2.52 MIL/uL — ABNORMAL LOW (ref 3.87–5.11)
RDW: 14 % (ref 11.5–15.5)
WBC: 12.2 10*3/uL — ABNORMAL HIGH (ref 4.0–10.5)
nRBC: 0 % (ref 0.0–0.2)

## 2023-09-05 LAB — VITAMIN B12: Vitamin B-12: 809 pg/mL (ref 180–914)

## 2023-09-05 LAB — ABO/RH: ABO/RH(D): O POS

## 2023-09-05 LAB — FOLATE: Folate: 18 ng/mL (ref >5.9)

## 2023-09-05 LAB — PREPARE RBC (CROSSMATCH)

## 2023-09-05 LAB — IRON AND TIBC
Iron: 20 ug/dL — ABNORMAL LOW (ref 28–170)
Saturation Ratios: 6 % — ABNORMAL LOW (ref 10.4–31.8)
TIBC: 315 ug/dL (ref 250–450)
UIBC: 295 ug/dL

## 2023-09-05 LAB — FERRITIN: Ferritin: 27 ng/mL (ref 11–307)

## 2023-09-05 MED ORDER — POLYSACCHARIDE IRON COMPLEX 150 MG PO CAPS
150.0000 mg | ORAL_CAPSULE | Freq: Every day | ORAL | Status: DC
Start: 2023-09-05 — End: 2023-09-10
  Administered 2023-09-05 – 2023-09-10 (×6): 150 mg via ORAL
  Filled 2023-09-05 (×6): qty 1

## 2023-09-05 MED ORDER — SENNA 8.6 MG PO TABS
1.0000 | ORAL_TABLET | Freq: Two times a day (BID) | ORAL | Status: DC
  Administered 2023-09-05 – 2023-09-09 (×6): 8.6 mg via ORAL
  Filled 2023-09-05 (×9): qty 1

## 2023-09-05 MED ORDER — HYDROCODONE-ACETAMINOPHEN 7.5-325 MG PO TABS: 1.0000 | ORAL_TABLET | ORAL | 0 refills | Status: DC | PRN

## 2023-09-05 MED ORDER — ENOXAPARIN SODIUM 40 MG/0.4ML IJ SOSY: 40.0000 mg | PREFILLED_SYRINGE | INTRAMUSCULAR | Status: DC

## 2023-09-05 MED ORDER — APIXABAN 2.5 MG PO TABS: 2.5000 mg | ORAL_TABLET | Freq: Two times a day (BID) | ORAL | 0 refills | Status: DC

## 2023-09-05 MED ORDER — POLYETHYLENE GLYCOL 3350 17 G PO PACK
17.0000 g | PACK | Freq: Every day | ORAL | Status: DC
Start: 2023-09-05 — End: 2023-09-10
  Administered 2023-09-05: 17 g via ORAL
  Filled 2023-09-05 (×4): qty 1

## 2023-09-05 MED ORDER — METHOCARBAMOL 500 MG PO TABS
500.0000 mg | ORAL_TABLET | Freq: Three times a day (TID) | ORAL | 0 refills | Status: DC | PRN
Start: 2023-09-05 — End: 2023-09-08

## 2023-09-05 MED ORDER — SODIUM CHLORIDE 0.9% IV SOLUTION: Freq: Once | INTRAVENOUS | Status: AC

## 2023-09-05 NOTE — Progress Notes (Addendum)
Physical Therapy Treatment Patient Details Name: Denise Gonzales MRN: [id number] DOB: 08-05-1945 Today's Date: 09/05/2023   History of Present Illness Patient is 78 y.o. female presented 09/03/23 to [facility] after a fall resulting in Rt supracondylar distal femur fracture. Pt s/p Rt ORIF on 09/04/23 and is WBAT. PMH significant for asthma, GERD, PSVT, HTN, HLD, osteopenia.    PT Comments  Pt received in chair after premedication, pt still c/o RLE significant pain but slightly improved from previous attempt, pt able to participate in transfer training from chair<>bed with heavy reliance on RW and pt still with very limited RLE AROM and not able to tolerate more than RLE TDWB during standing/transfer to bed, pt utilizing heel to toe pivot method with LLE and unable to lift LLE from floor or hop due to pain/fatigue. Discussed use of ice/elevation for pain control and increased ROM throughout the day to prevent pain from becoming so severe. Pt will continue to benefit from skilled rehab in a post acute setting < 3 hours per day to maximize functional gains before returning home.     If plan is discharge home, recommend the following: Two people to help with walking and/or transfers;A lot of help with bathing/dressing/bathroom;Assistance with cooking/housework;Direct supervision/assist for medications management;Assist for transportation;Help with stairs or ramp for entrance   Can travel by private vehicle     Yes  Equipment Recommendations  Rolling walker (2 wheels);BSC/3in1 (youth height RW; currently would need WC but likely to progress past needing it post-acute)    Recommendations for Other Services       Precautions / Restrictions Precautions Precautions: Fall Recall of Precautions/Restrictions: Intact Restrictions Weight Bearing Restrictions Per Provider Order: Yes RLE Weight Bearing Per Provider Order: Weight bearing as tolerated     Mobility  Bed Mobility Overal bed mobility: Needs  Assistance Bed Mobility: Sit to Supine       Sit to supine: Mod assist, HOB elevated, Used rails   General bed mobility comments: Pt encouraged to try using LLE hooked under RLE to assist with transfer, but due to severe c/o pain and pt internal distraction, pt requesting increased BLE assist and unable to also use gait belt as leg lifter when PTA offered this technique.    Transfers Overall transfer level: Needs assistance Equipment used: Rolling walker (2 wheels) Transfers: Sit to/from Stand, Bed to chair/wheelchair/BSC Sit to Stand: Min assist Stand pivot transfers: Min assist         General transfer comment: chair>RW and RW>EOB, dense multimodal cues for safe RLE placement for pain mgmt with each transfer and pt taking greatly increased time to perform due to anticipatory pain. Heavy minA for each transfer with PTA holding her RLE out more with stand>sit due to pt c/o anxiety surrounding pain even though meds given ~30 mins prior to transfer. Pt unable to hop or deweight LLE for stepping so performed heel to toe pivot with LLE and mostly keeping RLE at TDWB due to pain.    Ambulation/Gait               General Gait Details: pain too severe today to WB more than 10% on RLE   Stairs             Wheelchair Mobility     Tilt Bed    Modified Rankin (Stroke Patients Only)       Balance Overall balance assessment: Needs assistance Sitting-balance support: Bilateral upper extremity supported, Feet supported Sitting balance-Leahy Scale: Fair Sitting balance - Comments:  tending to rely on BUE support due to pain   Standing balance support: During functional activity, Reliant on assistive device for balance, Bilateral upper extremity supported Standing balance-Leahy Scale: Poor Standing balance comment: BUE support and external assist                            Communication Communication Communication: No apparent difficulties Factors Affecting  Communication:  (pt is bilingual, also speaks [place])  Cognition Arousal: Alert Behavior During Therapy: WFL for tasks assessed/performed   PT - Cognitive impairments: Sequencing, Problem solving                       PT - Cognition Comments: Decreased insight into need to premedicate to reduce swelling and allow for more frequent mobility. Following commands: Intact      Cueing Cueing Techniques: Verbal cues, Visual cues     General Comments General comments (skin integrity, edema, etc.): SpO2/HR WFL on RA      Pertinent Vitals/Pain Pain Assessment Pain Assessment: 0-10 Pain Score: 7  Faces Pain Scale: Hurts worst Pain Location: Rt knee and thigh Pain Descriptors / Indicators: Aching, Discomfort, Guarding, Operative site guarding, Moaning, Sore, Tightness, Constant Pain Intervention(s): Limited activity within patient's tolerance, Monitored during session, Premedicated before session, Repositioned, Ice applied    PT Goals (current goals can now be found in the care plan section) Acute Rehab PT Goals Patient Stated Goal: regain mobility, stop hurting PT Goal Formulation: With patient Time For Goal Achievement: 09/18/23 Progress towards PT goals: Progressing toward goals    Frequency    Min 1X/week      PT Plan      Co-evaluation              AM-PAC PT "6 Clicks" Mobility   Outcome Measure  Help needed turning from your back to your side while in a flat bed without using bedrails?: A Little Help needed moving from lying on your back to sitting on the side of a flat bed without using bedrails?: A Lot Help needed moving to and from a bed to a chair (including a wheelchair)?: A Lot Help needed standing up from a chair using your arms (e.g., wheelchair or bedside chair)?: A Lot Help needed to walk in hospital room?: Total Help needed climbing 3-5 steps with a railing? : Total 6 Click Score: 11    End of Session Equipment Utilized During Treatment:  Gait belt Activity Tolerance: Patient limited by pain Patient left: with call bell/phone within reach;with family/visitor present;in bed;with bed alarm set (son in her room) Nurse Communication: Mobility status;Precautions (RLE elevated, ice over limb) PT Visit Diagnosis: Unsteadiness on feet (R26.81);Other abnormalities of gait and mobility (R26.89);Muscle weakness (generalized) (M62.81);Difficulty in walking, not elsewhere classified (R26.2);Pain Pain - Right/Left: Right Pain - part of body: Knee     Time: 1655-1711 PT Time Calculation (min) (ACUTE ONLY): 16 min  Charges:    $Therapeutic Activity: 8-22 mins PT General Charges $$ ACUTE PT VISIT: 1 Visit                     Kimbra Marcelino P., PTA Acute Rehabilitation Services Secure Chat Preferred 9a-5:30pm Office: [phone]    Dorathy Kinsman [facility] 09/05/2023, 5:59 PM

## 2023-09-05 NOTE — Plan of Care (Signed)
   Problem: Activity: Goal: Risk for activity intolerance will decrease Outcome: Progressing   Problem: Coping: Goal: Level of anxiety will decrease Outcome: Progressing   Problem: Pain Managment: Goal: General experience of comfort will improve and/or be controlled Outcome: Progressing

## 2023-09-05 NOTE — TOC Initial Note (Signed)
Transition of Care [facility]) - Initial/Assessment Note    Patient Details  Name: Denise Gonzales MRN: [id number] Date of Birth: April 18, 1946  Transition of Care [facility]) CM/SW Contact:    Lorri Frederick, LCSW Phone Number: 09/05/2023, 10:11 AM  Clinical Narrative:    CSW met with pt regarding PT recommendation for SNF.  Pt from home with husband Renae Fickle, grandson Terry.  No current services.  Permission given to speak with them and also with son Gregary Signs, daughter Lurena Joiner.    Pt agreeable to SNF, medicare choice document provided, permission given to send out referral in the hub.  Referral sent out in hub for SNF.             Expected Discharge Plan: Skilled Nursing Facility Barriers to Discharge: Continued Medical Work up, SNF Pending bed offer   Patient Goals and CMS Choice Patient states their goals for this hospitalization and ongoing recovery are:: back to normal CMS Medicare.gov Compare Post Acute Care list provided to:: Patient Choice offered to / list presented to : Patient      Expected Discharge Plan and Services In-house Referral: Clinical Social Work   Post Acute Care Choice: Skilled Nursing Facility Living arrangements for the past 2 months: Single Family Home                                      Prior Living Arrangements/Services Living arrangements for the past 2 months: Single Family Home Lives with:: Spouse, Relatives (grandson Air cabin crew) Patient language and need for interpreter reviewed:: Yes Do you feel safe going back to the place where you live?: Yes      Need for Family Participation in Patient Care: Yes (Comment) Care giver support system in place?: Yes (comment) Current home services: Other (comment) (none) Criminal Activity/Legal Involvement Pertinent to Current Situation/Hospitalization: No - Comment as needed  Activities of Daily Living   ADL Screening (condition at time of admission) Independently performs ADLs?: Yes (appropriate for developmental  age) Is the patient deaf or have difficulty hearing?: No Does the patient have difficulty seeing, even when wearing glasses/contacts?: No Does the patient have difficulty concentrating, remembering, or making decisions?: No  Permission Sought/Granted Permission sought to share information with : Family Supports Permission granted to share information with : Yes, Verbal Permission Granted  Share Information with NAME: husband Renae Fickle, son Gregary Signs, daughter Lurena Joiner, grandson Air cabin crew  Permission granted to share info w AGENCY: SNF        Emotional Assessment Appearance:: Appears stated age Attitude/Demeanor/Rapport: Engaged Affect (typically observed): Appropriate, Pleasant Orientation: : Oriented to Self, Oriented to Place, Oriented to  Time, Oriented to Situation      Admission diagnosis:  Other fracture of right femur, initial encounter for closed fracture (HCC) [S72.8X1A] Fall, initial encounter [W19.XXXA] Closed bicondylar fracture of right femur, initial encounter (HCC) [Q46.962X, S72.431A] Fall [W19.XXXA] Patient Active Problem List   Diagnosis Date Noted   Other fracture of right femur, initial encounter for closed fracture (HCC) 09/03/2023   Fall at home, initial encounter 09/03/2023   Fall 09/03/2023   Allergic rhinitis 11/10/2019   Asthma, chronic 09/28/2013   HTN (hypertension) 09/28/2013   PCP:  Oneita Hurt, No Pharmacy:   [id number] - [place], [place] - [address] [place] [address] Phone: [phone] Fax: [phone]  [facility] - [place], [place] - [phone]  [address] [place] [address] Phone: [phone] Fax: [phone]  [facility] [place] - [facility] [address] [place] [address] Phone: [phone] Fax: [phone]  [id number] [place], [place] - [address] [facility] [address] [place] [address] Phone: [phone] Fax: [phone]     Social Drivers of Health (SDOH) Social History: SDOH Screenings   Food Insecurity: No Food Insecurity (09/03/2023)  Housing: Low Risk  (09/03/2023)  Transportation Needs: No Transportation Needs (09/03/2023)  Utilities: Not At Risk (09/03/2023)  Social Connections: Socially Integrated (09/03/2023)  Tobacco Use: Low Risk  (09/04/2023)   SDOH Interventions:     Readmission Risk Interventions     No data to display

## 2023-09-05 NOTE — [place] FL2 (Signed)
[place] MEDICAID FL2 LEVEL OF CARE FORM     IDENTIFICATION  Patient Name: Denise Gonzales Birthdate: [birth year] Sex: female Admission Date (Current Location): 09/03/2023  [facility] and [place] Number:  Producer, television/film/video and Address:  [facility] [place], [address] [place], [place] [address]      Provider Number: [id number]  Attending Physician Name and Address:  Alba Cory, MD  Relative Name and Phone Number:  Stephan, Draughn  [phone]    Current Level of Care: Hospital Recommended Level of Care: Skilled Nursing Facility Prior Approval Number:    Date Approved/Denied:   PASRR Number: [id number]  Discharge Plan: SNF    Current Diagnoses: Patient Active Problem List   Diagnosis Date Noted   Other fracture of right femur, initial encounter for closed fracture (HCC) 09/03/2023   Fall at home, initial encounter 09/03/2023   Fall 09/03/2023   Allergic rhinitis 11/10/2019   Asthma, chronic 09/28/2013   HTN (hypertension) 09/28/2013    Orientation RESPIRATION BLADDER Height & Weight     Self, Time, Situation, Place  Normal Continent Weight: 126 lb 12.2 oz (57.5 kg) Height:  5\' 2"  (157.5 cm)  BEHAVIORAL SYMPTOMS/MOOD NEUROLOGICAL BOWEL NUTRITION STATUS      Continent Diet  AMBULATORY STATUS COMMUNICATION OF NEEDS Skin   Total Care Verbally Surgical wounds, Other (Comment) (ecchymosis)                       Personal Care Assistance Level of Assistance  Bathing, Feeding, Dressing Bathing Assistance: Limited assistance Feeding assistance: Independent Dressing Assistance: Limited assistance     Functional Limitations Info  Sight, Hearing, Speech Sight Info: Adequate Hearing Info: Adequate Speech Info: Adequate    SPECIAL CARE FACTORS FREQUENCY  PT (By licensed PT), OT (By licensed OT)     PT Frequency: 5x week OT Frequency: 5x week            Contractures Contractures Info: Not present    Additional Factors Info   Code Status, Allergies Code Status Info: full Allergies Info: Bactrim (Sulfamethoxazole-trimethoprim)           Current Medications (09/05/2023):  This is the current hospital active medication list Current Facility-Administered Medications  Medication Dose Route Frequency Provider Last Rate Last Admin   0.9 %  sodium chloride infusion (Manually program via Guardrails IV Fluids)   Intravenous Once Regalado, Belkys A, MD       0.9 %  sodium chloride infusion   Intravenous Continuous West Bali, PA-C 50 mL/hr at 09/03/23 1837 New Bag at 09/03/23 1837   acetaminophen (TYLENOL) tablet 325-650 mg  325-650 mg Oral Q6H PRN West Bali, PA-C       albuterol (PROVENTIL) (2.5 MG/3ML) 0.083% nebulizer solution 3 mL  3 mL Inhalation Q4H PRN Sharon Seller, Sarah A, PA-C       Chlorhexidine Gluconate Cloth 2 % PADS 6 each  6 each Topical Daily West Bali, PA-C   6 each at 09/04/23 1201   diltiazem (CARDIZEM CD) 24 hr capsule 240 mg  240 mg Oral Daily Regalado, Belkys A, MD   240 mg at 09/05/23 0934   docusate sodium (COLACE) capsule 100 mg  100 mg Oral BID Thyra Breed A, PA-C   100 mg at 09/05/23 0935   [START ON 09/06/2023] enoxaparin (LOVENOX) injection 40 mg  40 mg Subcutaneous Q24H McClung, Sarah A, PA-C       fluticasone (FLONASE) 50 MCG/ACT nasal  spray 2 spray  2 spray Each Nare Daily PRN West Bali, PA-C       fluticasone-salmeterol (ADVAIR) 100-50 MCG/ACT inhaler 1 puff  1 puff Inhalation BID Regalado, Belkys A, MD   1 puff at 09/04/23 2136   hydrALAZINE (APRESOLINE) tablet 10 mg  10 mg Oral Q6H PRN West Bali, PA-C       HYDROcodone-acetaminophen (NORCO) 7.5-325 MG per tablet 1-2 tablet  1-2 tablet Oral Q4H PRN West Bali, PA-C   1 tablet at 09/05/23 0448   HYDROcodone-acetaminophen (NORCO/VICODIN) 5-325 MG per tablet 1-2 tablet  1-2 tablet Oral Q4H PRN West Bali, PA-C       lisinopril (ZESTRIL) tablet 40 mg  40 mg Oral Daily West Bali, PA-C   40 mg at  09/05/23 0934   loratadine (CLARITIN) tablet 10 mg  10 mg Oral Daily West Bali, PA-C   10 mg at 09/05/23 1610   methocarbamol (ROBAXIN) tablet 500 mg  500 mg Oral Q6H PRN West Bali, PA-C       Or   methocarbamol (ROBAXIN) injection 500 mg  500 mg Intravenous Q6H PRN Sharon Seller, Sarah A, PA-C       metoCLOPramide (REGLAN) tablet 5-10 mg  5-10 mg Oral Q8H PRN Sharon Seller, Sarah A, PA-C       Or   metoCLOPramide (REGLAN) injection 5-10 mg  5-10 mg Intravenous Q8H PRN McClung, Sarah A, PA-C       montelukast (SINGULAIR) tablet 10 mg  10 mg Oral Daily West Bali, PA-C   10 mg at 09/05/23 0935   morphine (PF) 2 MG/ML injection 0.5-1 mg  0.5-1 mg Intravenous Q2H PRN West Bali, PA-C       mupirocin ointment (BACTROBAN) 2 % 1 Application  1 Application Nasal BID West Bali, PA-C   1 Application at 09/05/23 0934   ondansetron (ZOFRAN) tablet 4 mg  4 mg Oral Q6H PRN West Bali, PA-C       Or   ondansetron (ZOFRAN) injection 4 mg  4 mg Intravenous Q6H PRN West Bali, PA-C   4 mg at 09/04/23 1242   polyethylene glycol (MIRALAX / GLYCOLAX) packet 17 g  17 g Oral Daily West Bali, PA-C   17 g at 09/05/23 0935   senna (SENOKOT) tablet 8.6 mg  1 tablet Oral Daily Regalado, Belkys A, MD   8.6 mg at 09/05/23 0935   sodium chloride flush (NS) 0.9 % injection 3 mL  3 mL Intravenous Q12H West Bali, PA-C   3 mL at 09/05/23 0501     Discharge Medications: Please see discharge summary for a list of discharge medications.  Relevant Imaging Results:  Relevant Lab Results:   Additional Information SSN: [id number]  Lorri Frederick, LCSW

## 2023-09-05 NOTE — Progress Notes (Signed)
Physical Therapy Treatment Patient Details Name: Denise Gonzales MRN: [id number] DOB: May 18, 1946 Today's Date: 09/05/2023   History of Present Illness Patient is 78 y.o. female presented 09/03/23 to [facility] after a fall resulting in Rt supracondylar distal femur fracture. Pt s/p Rt ORIF on 09/04/23 and is WBAT. PMH significant for asthma, GERD, PSVT, HTN, HLD, osteopenia.    PT Comments  Pt received in chair, per RN pt had deferred pain meds when offered prior to session, when PTA arrived pt reporting only minimal RLE pain however once chair leg rest lowered and pt encouraged to attempt repositioning RLE and working on ROM, pt reporting severe unrelenting pain and with difficulty flexing R knee >50* or fully extending it. Pt given HEP handout and some AAROM as tolerated, RN notified of pt pain score and pt agreeable to RN premedication prior to standing transfer training. Pt will continue to benefit from skilled rehab in a post acute setting < 3 hours per day to maximize functional gains before returning home.     If plan is discharge home, recommend the following: Two people to help with walking and/or transfers;A lot of help with bathing/dressing/bathroom;Assistance with cooking/housework;Direct supervision/assist for medications management;Assist for transportation;Help with stairs or ramp for entrance   Can travel by private vehicle     Yes  Equipment Recommendations  Rolling walker (2 wheels);BSC/3in1 (youth height RW)    Recommendations for Other Services       Precautions / Restrictions Precautions Precautions: Fall Recall of Precautions/Restrictions: Intact Restrictions Weight Bearing Restrictions Per Provider Order: Yes RLE Weight Bearing Per Provider Order: Weight bearing as tolerated     Mobility  Bed Mobility Overal bed mobility: Needs Assistance             General bed mobility comments: pt received up in chair, too painful to stand during session    Transfers Overall  transfer level: Needs assistance                 General transfer comment: minA for posterior scooting in chair    Ambulation/Gait                   Stairs             Wheelchair Mobility     Tilt Bed    Modified Rankin (Stroke Patients Only)       Balance Overall balance assessment: Needs assistance Sitting-balance support: Bilateral upper extremity supported, Feet supported Sitting balance-Leahy Scale: Fair Sitting balance - Comments: tending to rely on BUE support due to pain       Standing balance comment: defer, pain too severe when PTA arrived, pt unable to tolerate R knee flexion >30 deg                            Communication Communication Communication: No apparent difficulties Factors Affecting Communication:  (pt is bilingual, also speaks [place])  Cognition Arousal: Alert Behavior During Therapy: WFL for tasks assessed/performed   PT - Cognitive impairments: Sequencing, Problem solving                       PT - Cognition Comments: Per RN, pt had refused pain meds just prior to session, however with instruction on need to mobilize RLE more often, pt c/o severe pain (when leg rest lowered) and pt then agreeable to PTA notifying RN for pt to have pain meds. Pt instructed on  need to self-monitor throughout the day with more frequent RLE AROM and positional changes to ensure pain does not become this severe. Following commands: Intact      Cueing Cueing Techniques: Verbal cues, Visual cues  Exercises General Exercises - Lower Extremity Ankle Circles/Pumps: AROM, Both, 20 reps, Supine Long Arc Quad: AROM, AAROM, Right, 10 reps, Seated (AA for improved ROM, but pt not able to maintain R knee flexion >50* and difficulty extending past 10*) Hip ABduction/ADduction: AAROM, Right, 10 reps, Seated (recliner) Straight Leg Raises: AAROM, Right, 5 reps, Supine (recliner)    General Comments General comments (skin integrity,  edema, etc.): VSS on RA, HR 70's bpm      Pertinent Vitals/Pain Pain Assessment Pain Assessment: Faces Faces Pain Scale: Hurts worst Pain Location: Rt knee Pain Descriptors / Indicators: Aching, Discomfort, Guarding, Operative site guarding, Moaning, Sore, Tightness, Constant Pain Intervention(s): Limited activity within patient's tolerance, Monitored during session, Repositioned, Patient requesting pain meds-RN notified, Ice applied    Home Living                          Prior Function            PT Goals (current goals can now be found in the care plan section) Acute Rehab PT Goals Patient Stated Goal: regain mobility, stop hurting PT Goal Formulation: With patient Time For Goal Achievement: 09/18/23 Progress towards PT goals: Not progressing toward goals - comment (pain limiting her)    Frequency    Min 1X/week      PT Plan      Co-evaluation              AM-PAC PT "6 Clicks" Mobility   Outcome Measure  Help needed turning from your back to your side while in a flat bed without using bedrails?: A Little Help needed moving from lying on your back to sitting on the side of a flat bed without using bedrails?: A Lot Help needed moving to and from a bed to a chair (including a wheelchair)?: A Lot Help needed standing up from a chair using your arms (e.g., wheelchair or bedside chair)?: A Lot Help needed to walk in hospital room?: Total Help needed climbing 3-5 steps with a railing? : Total 6 Click Score: 11    End of Session Equipment Utilized During Treatment: Gait belt Activity Tolerance: Patient limited by pain Patient left: in chair;with call bell/phone within reach;with chair alarm set;with family/visitor present (son in her room) Nurse Communication: Mobility status;Patient requests pain meds PT Visit Diagnosis: Unsteadiness on feet (R26.81);Other abnormalities of gait and mobility (R26.89);Muscle weakness (generalized) (M62.81);Difficulty in  walking, not elsewhere classified (R26.2);Pain Pain - Right/Left: Right Pain - part of body: Knee     Time: 2130-8657 PT Time Calculation (min) (ACUTE ONLY): 12 min  Charges:    $Therapeutic Exercise: 8-22 mins PT General Charges $$ ACUTE PT VISIT: 1 Visit                     Jeyson Deshotel P., PTA Acute Rehabilitation Services Secure Chat Preferred 9a-5:30pm Office: [phone]    Dorathy Kinsman [facility] 09/05/2023, 5:49 PM

## 2023-09-05 NOTE — Progress Notes (Signed)
PROGRESS NOTE    Denise Gonzales  GNF:[id number] DOB: [birth year] DOA: 09/03/2023 PCP: Pcp, No   Brief Narrative: 78 year old with past medical history significant for allergies to Bactrim, asthma, hypertension, GERD, hyperlipidemia, osteopenia present with med [facility] after a fall.  Patient tripped and landed on her right side.  She was unable to move her leg or extended.  Evaluation in the ED CT knee showed fracture of the femur.  Comminuted extra-articular fracture of the distal femoral metaphysis with substantial comminution.   Assessment & Plan:   Principal Problem:   Other fracture of right femur, initial encounter for closed fracture [facility]) Active Problems:   HTN (hypertension)   Fall at home, initial encounter  1-Right distal femur fracture: -Presented after a fall found to have right distal femoral fracture -Underwent open reduction internal fixation of right distal femur fracture by Dr. Jena Gauss 2 2/20. -Pain management with hydrocodone as needed -Scheduled laxative -PT OT per orthopedic, may need rehab.    COPD; Stable. She prefer to use home inhaler, Advair. Order in. Continue with Singulair PRN albuterol.  HTN; Continue with Cardizem and lisinopril.  With hold hydrochlorothiazide for now  Fall: -Will need PT OT eval  Anemia, Acute Blood loss anemia in post sx -Iron deficiency. Start supplement. Will received one unit PRBC  Leukocytosis: -WBC on admission at 12, trending down. 10 -Chest X ray: No acute finding.   Hypokalemia -Replete orally.       Estimated body mass index is 23.19 kg/m as calculated from the following:   Height as of this encounter: 5\' 2"  (1.575 m).   Weight as of this encounter: 57.5 kg.   DVT prophylaxis: Lovenox Code Status: Full code Family Communication: Care discussed with patient and daughter who was at bedside Disposition Plan:  Status is: Inpatient Remains inpatient appropriate because: Management postop femur  fracture    Consultants:  Dr Jena Gauss  Procedures:  none  Antimicrobials:    Subjective: She report pain is controlled no BM yet   Objective: Vitals:   09/05/23 1033 09/05/23 1105 09/05/23 1335 09/05/23 1343  BP: (!) 129/53 (!) 148/57 124/81 (!) 143/50  Pulse: 87 81 72 72  Resp: 16 17 18 18   Temp: 97.8 F (36.6 C) 97.9 F (36.6 C) 97.9 F (36.6 C) 98.3 F (36.8 C)  TempSrc: Oral Oral Oral Oral  SpO2: 97% 100% 100% 100%  Weight:      Height:        Intake/Output Summary (Last 24 hours) at 09/05/2023 1419 Last data filed at 09/05/2023 1330 Gross per 24 hour  Intake 926.86 ml  Output --  Net 926.86 ml   Filed Weights   09/03/23 1622 09/04/23 0639 09/05/23 0504  Weight: 54.7 kg 50.8 kg 57.5 kg    Examination:  General exam: NAD Respiratory system: CTA Cardiovascular system: S 1 S 2 RRR Gastrointestinal system: BS present,soft, nt Central nervous system: alert    Data Reviewed: I have personally reviewed following labs and imaging studies  CBC: Recent Labs  Lab 09/03/23 1201 09/04/23 0455 09/04/23 1101 09/05/23 0519  WBC 12.4* 9.0 10.7* 12.2*  NEUTROABS 10.3*  --   --   --   HGB 10.6* 8.5* 9.2* 7.3*  HCT 32.1* 25.3* 27.8* 22.0*  MCV 86.5 86.9 88.0 87.3  PLT 250 201 177 174   Basic Metabolic Panel: Recent Labs  Lab 09/03/23 1201 09/04/23 0455 09/04/23 1101 09/05/23 0519  NA 134* 134*  --  138  K 3.7  3.4*  --  4.4  CL 101 101  --  104  CO2 22 24  --  23  GLUCOSE 136* 134*  --  145*  BUN 32* 39*  --  25*  CREATININE 0.77 1.32* 1.16* 1.01*  CALCIUM 8.8* 8.5*  --  8.9   GFR: Estimated Creatinine Clearance: 36.9 mL/min (A) (by C-G formula based on SCr of 1.01 mg/dL (H)). Liver Function Tests: Recent Labs  Lab 09/04/23 0455  AST 16  ALT 14  ALKPHOS 54  BILITOT 0.6  PROT 5.4*  ALBUMIN 3.2*   No results for input(s): "LIPASE", "AMYLASE" in the last 168 hours. No results for input(s): "AMMONIA" in the last 168 hours. Coagulation  Profile: Recent Labs  Lab 09/03/23 1201  INR 1.0   Cardiac Enzymes: No results for input(s): "CKTOTAL", "CKMB", "CKMBINDEX", "TROPONINI" in the last 168 hours. BNP (last 3 results) No results for input(s): "PROBNP" in the last 8760 hours. HbA1C: Recent Labs    09/03/23 1821  HGBA1C 5.6   CBG: No results for input(s): "GLUCAP" in the last 168 hours. Lipid Profile: No results for input(s): "CHOL", "HDL", "LDLCALC", "TRIG", "CHOLHDL", "LDLDIRECT" in the last 72 hours. Thyroid Function Tests: Recent Labs    09/03/23 1821  TSH 0.577  FREET4 0.84   Anemia Panel: Recent Labs    09/05/23 0519  VITAMINB12 809  FOLATE 18.0  FERRITIN 27  TIBC 315  IRON 20*  RETICCTPCT 1.4   Sepsis Labs: No results for input(s): "PROCALCITON", "LATICACIDVEN" in the last 168 hours.  Recent Results (from the past 240 hours)  Surgical pcr screen     Status: Abnormal   Collection Time: 09/04/23 12:12 AM   Specimen: Nasal Mucosa; Nasal Swab  Result Value Ref Range Status   MRSA, PCR NEGATIVE NEGATIVE Final   Staphylococcus aureus POSITIVE (A) NEGATIVE Final    Comment: (NOTE) The Xpert SA Assay (FDA approved for NASAL specimens in patients 101 years of age and older), is one component of a comprehensive surveillance program. It is not intended to diagnose infection nor to guide or monitor treatment. Performed at [facility], [address] [place], [place] [address]          Radiology Studies: DG Knee Right Port Result Date: 09/04/2023 CLINICAL DATA:  Distal right femoral fracture. EXAM: PORTABLE RIGHT KNEE - 1-2 VIEW COMPARISON:  Right knee radiographs 09/03/2023, CT right knee 09/03/2023 FINDINGS: There is diffuse decreased bone mineralization. Interval long lateral plate and screw fixation of the distal femur including the patient seen markedly comminuted acute distal femoral diaphyseal through lateral femoral condyle fracture. There is approximately 5 mm medial displacement  of a dominant medial cortical butterfly fracture fragment, similar to prior. Improvement in multiple lateral distal femoral metadiaphyseal cortical fracture fragment alignment. Mild posterior displacement of a portion of a distal posterior femoral metadiaphyseal cortical fracture fragment. No evidence of hardware failure. IMPRESSION: Interval long lateral plate and screw fixation of the distal femur including the patient seen markedly comminuted acute distal femoral diaphyseal through lateral femoral condyle fracture. Electronically Signed   By: Neita Garnet M.D.   On: 09/04/2023 10:50   DG FEMUR, MIN 2 VIEWS RIGHT Result Date: 09/04/2023 CLINICAL DATA:  Open reduction and internal fixation of distal right femoral fracture. EXAM: RIGHT FEMUR 2 VIEWS; DG C-ARM 1-60 MIN-NO REPORT Radiation exposure index: 2.52 mGy. COMPARISON:  September 03, 2023. FINDINGS: Seven intraoperative fluoroscopic images were obtained of the distal right femur. These demonstrate surgical internal fixation  of distal right femoral fracture. IMPRESSION: Fluoroscopic guidance provided during surgical internal fixation of distal right femoral fracture. Electronically Signed   By: Lupita Raider M.D.   On: 09/04/2023 08:45   DG C-Arm 1-60 Min-No Report Result Date: 09/04/2023 Fluoroscopy was utilized by the requesting physician.  No radiographic interpretation.   DG Chest 2 View Result Date: 09/03/2023 CLINICAL DATA:  Preop chest exam.  Femur fracture. EXAM: CHEST - 2 VIEW COMPARISON:  04/23/2017 FINDINGS: The cardiomediastinal contours are normal. Chronic coarsened lung markings. Pulmonary vasculature is normal. No consolidation, pleural effusion, or pneumothorax. No acute osseous abnormalities are seen. The bones are diffusely under mineralized. IMPRESSION: No acute findings. Electronically Signed   By: Narda Rutherford M.D.   On: 09/03/2023 21:00        Scheduled Meds:  Chlorhexidine Gluconate Cloth  6 each Topical Daily    diltiazem  240 mg Oral Daily   docusate sodium  100 mg Oral BID   [START ON 09/06/2023] enoxaparin (LOVENOX) injection  40 mg Subcutaneous Q24H   fluticasone-salmeterol  1 puff Inhalation BID   iron polysaccharides  150 mg Oral Daily   lisinopril  40 mg Oral Daily   loratadine  10 mg Oral Daily   montelukast  10 mg Oral Daily   mupirocin ointment  1 Application Nasal BID   polyethylene glycol  17 g Oral Daily   senna  1 tablet Oral Daily   sodium chloride flush  3 mL Intravenous Q12H   Continuous Infusions:  sodium chloride 50 mL/hr at 09/03/23 1837     LOS: 2 days    Time spent: 35 minutes     Kiva Norland A Chloe Flis, MD [facility]   If 7PM-7AM, please contact night-coverage [email or web address]  09/05/2023, 2:19 PM

## 2023-09-05 NOTE — Progress Notes (Signed)
Occupational Therapy Evaluation Patient Details Name: Denise Gonzales MRN: [id number] DOB: 03/08/1946 Today's Date: 09/05/2023   History of Present Illness   Patient is 78 y.o. female presented 09/03/23 to [facility] after a fall resulting in Rt supracondylar distal femur fracture. Pt s/p Rt ORIF on 09/04/23 and is WBAT. PMH significant for asthma, GERD, PSVT, HTN, HLD, osteopenia.     Clinical Impressions Pt admitted for above, and limited by problem list below. PTA pt was independent with ADLs and IADLs. Currently pt is requiring CGA to mod A for ADLs, and CGA for transfers. During functional transfer pt HR increased to 112, but O2 levels remained stable. Limited to bed to chair transfer d/t RN performing blood transfusion. Pt demonstrates good rehab potential for improvement with the use of AE and compensatory strategies. OT recommending to follow up acutely to improve functional independence. Recommending d/c to <3 hours daily rehab.      If plan is discharge home, recommend the following:   A little help with walking and/or transfers;A lot of help with bathing/dressing/bathroom     Functional Status Assessment   Patient has had a recent decline in their functional status and demonstrates the ability to make significant improvements in function in a reasonable and predictable amount of time.     Equipment Recommendations   BSC/3in1;Tub/shower seat (Defer to next venue)     Recommendations for Other Services         Precautions/Restrictions   Precautions Precautions: Fall Recall of Precautions/Restrictions: Intact Restrictions Weight Bearing Restrictions Per Provider Order: Yes RLE Weight Bearing Per Provider Order: Weight bearing as tolerated     Mobility Bed Mobility Overal bed mobility: Needs Assistance Bed Mobility: Supine to Sit     Supine to sit: Used rails, Contact guard          Transfers Overall transfer level: Needs assistance Equipment used: Rolling  walker (2 wheels) Transfers: Sit to/from Stand, Bed to chair/wheelchair/BSC Sit to Stand: Contact guard assist Stand pivot transfers: Contact guard assist         General transfer comment: Cues for hand placement on walker and bed for rise. CGA for rise, SPT completed with CGA and cues for pivot to chair. Pt verbalized pain, but able to complete transfer with increased time      Balance Overall balance assessment: Needs assistance Sitting-balance support: Bilateral upper extremity supported, Feet supported       Standing balance support: During functional activity, Reliant on assistive device for balance, Bilateral upper extremity supported                               ADL either performed or assessed with clinical judgement   ADL Overall ADL's : Needs assistance/impaired Eating/Feeding: Sitting;Set up   Grooming: Sitting;Set up           Upper Body Dressing : Sitting;Set up   Lower Body Dressing: Minimal assistance   Toilet Transfer: Contact guard assist;Stand-pivot;Rolling walker (2 wheels) Toilet Transfer Details (indicate cue type and reason): Simulated in room                 Vision Baseline Vision/History: 1 Wears glasses Vision Assessment?: No apparent visual deficits     Perception         Praxis         Pertinent Vitals/Pain Pain Assessment Pain Assessment: Faces Faces Pain Scale: Hurts even more Pain Location: Rt knee Pain Descriptors /  Indicators: Aching, Discomfort, Guarding, Operative site guarding Pain Intervention(s): Limited activity within patient's tolerance, Monitored during session, Repositioned, Ice applied     Extremity/Trunk Assessment Upper Extremity Assessment Upper Extremity Assessment: Overall WFL for tasks assessed   Lower Extremity Assessment Lower Extremity Assessment: Defer to PT evaluation   Cervical / Trunk Assessment Cervical / Trunk Assessment: Normal   Communication  Communication Communication: No apparent difficulties   Cognition Arousal: Alert Behavior During Therapy: WFL for tasks assessed/performed Cognition: No apparent impairments                               Following commands: Intact       Cueing  General Comments   Cueing Techniques: Verbal cues;Visual cues      Exercises     Shoulder Instructions      Home Living Family/patient expects to be discharged to:: Private residence Living Arrangements: Spouse/significant other;Other relatives Available Help at Discharge: Family Type of Home: House Home Access: Stairs to enter [employer] of Steps: 1 Entrance Stairs-Rails: None Home Layout: Able to live on main level with bedroom/bathroom;Multi-level Alternate Level Stairs-Number of Steps: 12-16 Alternate Level Stairs-Rails: Left Bathroom Shower/Tub: Walk-in shower;Tub/shower unit   Teacher, early years/pre: Yes   Home Equipment: None   Additional Comments: Spouse has 3:1 and [place], but equipment is not located in pt's primary d/c bathroom      Prior Functioning/Environment Prior Level of Function : Independent/Modified Independent;Driving;Working/employed                    OT Problem List: Decreased strength;Decreased activity tolerance;Impaired balance (sitting and/or standing);Pain   OT Treatment/Interventions: Self-care/ADL training;Therapeutic exercise;DME and/or AE instruction;Therapeutic activities;Patient/family education      OT Goals(Current goals can be found in the care plan section)   Acute Rehab OT Goals Patient Stated Goal: To get better OT Goal Formulation: With patient/family Time For Goal Achievement: 09/19/23 Potential to Achieve Goals: Good   OT Frequency:  Min 1X/week    Co-evaluation              AM-PAC OT "6 Clicks" Daily Activity     Outcome Measure Help from another person eating meals?: None Help from another person taking  care of personal grooming?: A Little Help from another person toileting, which includes using toliet, bedpan, or urinal?: A Lot Help from another person bathing (including washing, rinsing, drying)?: A Lot Help from another person to put on and taking off regular upper body clothing?: A Little Help from another person to put on and taking off regular lower body clothing?: A Lot 6 Click Score: 16   End of Session Equipment Utilized During Treatment: Rolling walker (2 wheels);Gait belt Nurse Communication: Mobility status  Activity Tolerance: Patient tolerated treatment well;Patient limited by pain Patient left: in chair;with call bell/phone within reach;with chair alarm set;with nursing/sitter in room;with family/visitor present  OT Visit Diagnosis: Unsteadiness on feet (R26.81);Other abnormalities of gait and mobility (R26.89)                Time: 1610-9604 OT Time Calculation (min): 27 min Charges:  OT General Charges $OT Visit: 1 Visit OT Evaluation $OT Eval Moderate Complexity: 1 Mod OT Treatments $Self Care/Home Management : 8-22 mins  Ivor Messier, OT  Acute Rehabilitation Services Office [phone] Secure chat preferred   Marilynne Drivers 09/05/2023, 11:36 AM

## 2023-09-05 NOTE — Progress Notes (Signed)
Orthopaedic Trauma Progress Note  SUBJECTIVE: Doing ok this AM. Pain controlled at rest, worsens with movement.  Denies any numbness or tingling throughout the right lower extremity.  No chest pain. No SOB. No nausea/vomiting. No other complaints.  Family at bedside  OBJECTIVE:  Vitals:   09/04/23 2132 09/05/23 0459  BP: (!) 127/47 132/63  Pulse:  83  Resp: 18 18  Temp: 98.2 F (36.8 C) 98.2 F (36.8 C)  SpO2: 96% 99%    General: Sitting up in bed, no acute distress.  Pleasant and cooperative Respiratory: No increased work of breathing.  Right lower extremity: Dressing clean, dry, intact.  Tenderness over the knee and throughout the thigh as expected.  Less tender throughout the lower leg, ankle, foot.  No calf tenderness.  Ankle DF/PF intact.  Able to wiggle the toes.  Compartment soft and compressible.  Endorses sensation to light touch over all aspects of the foot. + DP pulse  IMAGING: Stable post op imaging.   LABS:  Results for orders placed or performed during the hospital encounter of 09/03/23 (from the past 24 hours)  VITAMIN D 25 Hydroxy (Vit-D Deficiency, Fractures)     Status: None   Collection Time: 09/04/23 11:01 AM  Result Value Ref Range   Vit D, 25-Hydroxy 97.45 30 - 100 ng/mL  CBC     Status: Abnormal   Collection Time: 09/04/23 11:01 AM  Result Value Ref Range   WBC 10.7 (H) 4.0 - 10.5 K/uL   RBC 3.16 (L) 3.87 - 5.11 MIL/uL   Hemoglobin 9.2 (L) 12.0 - 15.0 g/dL   HCT 13.2 (L) 44.0 - 10.2 %   MCV 88.0 80.0 - 100.0 fL   MCH 29.1 26.0 - 34.0 pg   MCHC 33.1 30.0 - 36.0 g/dL   RDW 72.5 36.6 - 44.0 %   Platelets 177 150 - 400 K/uL   nRBC 0.0 0.0 - 0.2 %  Creatinine, serum     Status: Abnormal   Collection Time: 09/04/23 11:01 AM  Result Value Ref Range   Creatinine, Ser 1.16 (H) 0.44 - 1.00 mg/dL   GFR, Estimated 49 (L) >60 mL/min  Basic metabolic panel     Status: Abnormal   Collection Time: 09/05/23  5:19 AM  Result Value Ref Range   Sodium 138 135 - 145  mmol/L   Potassium 4.4 3.5 - 5.1 mmol/L   Chloride 104 98 - 111 mmol/L   CO2 23 22 - 32 mmol/L   Glucose, Bld 145 (H) 70 - 99 mg/dL   BUN 25 (H) 8 - 23 mg/dL   Creatinine, Ser 3.47 (H) 0.44 - 1.00 mg/dL   Calcium 8.9 8.9 - 42.5 mg/dL   GFR, Estimated 57 (L) >60 mL/min   Anion gap 11 5 - 15  CBC     Status: Abnormal   Collection Time: 09/05/23  5:19 AM  Result Value Ref Range   WBC 12.2 (H) 4.0 - 10.5 K/uL   RBC 2.52 (L) 3.87 - 5.11 MIL/uL   Hemoglobin 7.3 (L) 12.0 - 15.0 g/dL   HCT 95.6 (L) 38.7 - 56.4 %   MCV 87.3 80.0 - 100.0 fL   MCH 29.0 26.0 - 34.0 pg   MCHC 33.2 30.0 - 36.0 g/dL   RDW 33.2 95.1 - 88.4 %   Platelets 174 150 - 400 K/uL   nRBC 0.0 0.0 - 0.2 %  Vitamin B12     Status: None   Collection Time: 09/05/23  5:19 AM  Result Value Ref Range   Vitamin B-12 809 180 - 914 pg/mL  Folate     Status: None   Collection Time: 09/05/23  5:19 AM  Result Value Ref Range   Folate 18.0 >5.9 ng/mL  Iron and TIBC     Status: Abnormal   Collection Time: 09/05/23  5:19 AM  Result Value Ref Range   Iron 20 (L) 28 - 170 ug/dL   TIBC 161 096 - 045 ug/dL   Saturation Ratios 6 (L) 10.4 - 31.8 %   UIBC 295 ug/dL  Ferritin     Status: None   Collection Time: 09/05/23  5:19 AM  Result Value Ref Range   Ferritin 27 11 - 307 ng/mL  Reticulocytes     Status: Abnormal   Collection Time: 09/05/23  5:19 AM  Result Value Ref Range   Retic Ct Pct 1.4 0.4 - 3.1 %   RBC. 2.53 (L) 3.87 - 5.11 MIL/uL   Retic Count, Absolute 34.4 19.0 - 186.0 K/uL   Immature Retic Fract 3.0 2.3 - 15.9 %    ASSESSMENT: Denise Gonzales is a 78 y.o. female, 1 Day Post-Op s/p OPEN REDUCTION INTERNAL FIXATION RIGHT DISTAL FEMUR FRACTURE  CV/Blood loss: Acute blood loss anemia, Hgb 7.3 this morning.  1 unit PRBCs has been ordered per primary team. Hemodynamically stable  PLAN: Weightbearing: WBAT RLE ROM: Okay for knee ROM as tolerated Incisional and dressing care: Reinforce dressings as needed  Showering:  Okay to begin showering and getting incisions wet 09/07/2023 Orthopedic device(s): None  Pain management:  1. Tylenol 325-650 mg q 6 hours PRN 2. Robaxin 500 mg q 6 hours PRN 3. Norco 5-325 or Norco 7.5-325 mg q 4 hours PRN 4. Morphine 0.5-1 mg q 2 hours PRN VTE prophylaxis:  Hold Lovenox until hemoglobin stable , SCDs ID:  Ancef 2gm post op Foley/Lines:  No foley, KVO IVFs Impediments to Fracture Healing: Vitamin D level 97, no additional supplementation needed. Dispo: PT/OT evaluation today, dispo pending.  Continue to monitor CBC and start Lovenox once hemoglobin stable.  Plan to remove RLE dressing tomorrow 09/06/2023  D/C recommendations: -Norco 7.5-325 mg, Robaxin for pain control -Eliquis 2.5 mg twice daily x 30 days for DVT prophylaxis -No additional Vit D supplementation needed  Follow - up plan: 2 weeks after discharge for wound check and repeat x-rays   Contact information:  Truitt Merle MD, Thyra Breed PA-C. After hours and holidays please check [email or web address] for group call information for [facility]   Thompson Caul, PA-C [phone] (office) [email or web address]

## 2023-09-06 DIAGNOSIS — S728X1A Other fracture of right femur, initial encounter for closed fracture: Secondary | ICD-10-CM | POA: Diagnosis not present

## 2023-09-06 LAB — CBC
HCT: 23 % — ABNORMAL LOW (ref 36.0–46.0)
Hemoglobin: 7.6 g/dL — ABNORMAL LOW (ref 12.0–15.0)
MCH: 28.9 pg (ref 26.0–34.0)
MCHC: 33 g/dL (ref 30.0–36.0)
MCV: 87.5 fL (ref 80.0–100.0)
Platelets: 135 10*3/uL — ABNORMAL LOW (ref 150–400)
RBC: 2.63 MIL/uL — ABNORMAL LOW (ref 3.87–5.11)
RDW: 15.5 % (ref 11.5–15.5)
WBC: 9.1 10*3/uL (ref 4.0–10.5)
nRBC: 0 % (ref 0.0–0.2)

## 2023-09-06 LAB — TYPE AND SCREEN
ABO/RH(D): O POS
Antibody Screen: NEGATIVE
Unit division: 0

## 2023-09-06 LAB — BPAM RBC
Blood Product Expiration Date: 202503112359
ISSUE DATE / TIME: 202502211040
Unit Type and Rh: 5100

## 2023-09-06 MED ORDER — SODIUM CHLORIDE 0.9 % IV SOLN
100.0000 mg | Freq: Once | INTRAVENOUS | Status: AC
  Administered 2023-09-06: 100 mg via INTRAVENOUS
  Filled 2023-09-06: qty 5

## 2023-09-06 NOTE — Plan of Care (Signed)
   Problem: Education: Goal: Knowledge of General Education information will improve Description Including pain rating scale, medication(s)/side effects and non-pharmacologic comfort measures Outcome: Progressing   Problem: Activity: Goal: Risk for activity intolerance will decrease Outcome: Progressing   Problem: Safety: Goal: Ability to remain free from injury will improve Outcome: Progressing

## 2023-09-06 NOTE — Progress Notes (Addendum)
 PROGRESS NOTE    Denise Gonzales  UJW:[id number] DOB: Feb 26, 1946 DOA: 09/03/2023 PCP: Pcp, No   Brief Narrative: 78 year old with past medical history significant for allergies to Bactrim, asthma, hypertension, GERD, hyperlipidemia, osteopenia present with med [facility] after a fall.  Patient tripped and landed on her right side.  She was unable to move her leg or extended.  Evaluation in the ED CT knee showed fracture of the femur.  Comminuted extra-articular fracture of the distal femoral metaphysis with substantial comminution.   Assessment & Plan:   Principal Problem:   Other fracture of right femur, initial encounter for closed fracture [facility]) Active Problems:   HTN (hypertension)   Fall at home, initial encounter  1-Right Distal Femur Fracture: -Presented after a fall found to have right distal femoral fracture -Underwent open reduction internal fixation of right distal femur fracture by Dr. Jena Gauss 2 2/20. -Pain management with hydrocodone as needed -Scheduled laxative. -PT OT per orthopedic, may need rehab.  Had BM She will need rehab.   COPD; Stable. She prefer to use home inhaler, Advair. Order in. Continue with Singulair PRN albuterol.  HTN; Continue with Cardizem and lisinopril.  With hold hydrochlorothiazide for now  Fall: -Will need PT OT eval  Anemia, Acute Blood loss anemia in post sx -Iron deficiency. Started supplement. Received one unit PRBC 2/21. Hb only increase 7.6 Will give IV iron today. She seems asymptomatic.  Leukocytosis: -WBC on admission at 12, trending down. 10 -Chest X ray: No acute finding.   Hypokalemia -Replete orally.  AKI;monitor renal function  Estimated body mass index is 23.87 kg/m as calculated from the following:   Height as of this encounter: 5\' 2"  (1.575 m).   Weight as of this encounter: 59.2 kg.   DVT prophylaxis: Lovenox Code Status: Full code Family Communication: Care discussed with patient and daughter  who was at bedside Disposition Plan:  Status is: Inpatient Remains inpatient appropriate because: Management postop femur fracture    Consultants:  Dr Jena Gauss  Procedures:  none  Antimicrobials:    Subjective: She is alert,had BM Denies weakness, lightheadness  Objective: Vitals:   09/05/23 1544 09/05/23 2021 09/06/23 0500 09/06/23 0918  BP: (!) 130/48 (!) 121/50 (!) 126/53 (!) 157/57  Pulse: 75 76 60 79  Resp: 17  20 17   Temp: 98.4 F (36.9 C) 98.9 F (37.2 C) 98.2 F (36.8 C)   TempSrc: Oral Oral Oral   SpO2: 98% 94% 93% 96%  Weight:   59.2 kg   Height:        Intake/Output Summary (Last 24 hours) at 09/06/2023 1226 Last data filed at 09/06/2023 0526 Gross per 24 hour  Intake 836 ml  Output 600 ml  Net 236 ml   Filed Weights   09/04/23 0639 09/05/23 0504 09/06/23 0500  Weight: 50.8 kg 57.5 kg 59.2 kg    Examination:  General exam: NAD Respiratory system: CTA Cardiovascular system: S 1,S 2 RRR Gastrointestinal system: BS present soft,nt Central nervous system: Alert    Data Reviewed: I have personally reviewed following labs and imaging studies  CBC: Recent Labs  Lab 09/03/23 1201 09/04/23 0455 09/04/23 1101 09/05/23 0519 09/06/23 0446  WBC 12.4* 9.0 10.7* 12.2* 9.1  NEUTROABS 10.3*  --   --   --   --   HGB 10.6* 8.5* 9.2* 7.3* 7.6*  HCT 32.1* 25.3* 27.8* 22.0* 23.0*  MCV 86.5 86.9 88.0 87.3 87.5  PLT 250 201 177 174 135*   Basic Metabolic  Panel: Recent Labs  Lab 09/03/23 1201 09/04/23 0455 09/04/23 1101 09/05/23 0519  NA 134* 134*  --  138  K 3.7 3.4*  --  4.4  CL 101 101  --  104  CO2 22 24  --  23  GLUCOSE 136* 134*  --  145*  BUN 32* 39*  --  25*  CREATININE 0.77 1.32* 1.16* 1.01*  CALCIUM 8.8* 8.5*  --  8.9   GFR: Estimated Creatinine Clearance: 36.9 mL/min (A) (by C-G formula based on SCr of 1.01 mg/dL (H)). Liver Function Tests: Recent Labs  Lab 09/04/23 0455  AST 16  ALT 14  ALKPHOS 54  BILITOT 0.6  PROT 5.4*   ALBUMIN 3.2*   No results for input(s): "LIPASE", "AMYLASE" in the last 168 hours. No results for input(s): "AMMONIA" in the last 168 hours. Coagulation Profile: Recent Labs  Lab 09/03/23 1201  INR 1.0   Cardiac Enzymes: No results for input(s): "CKTOTAL", "CKMB", "CKMBINDEX", "TROPONINI" in the last 168 hours. BNP (last 3 results) No results for input(s): "PROBNP" in the last 8760 hours. HbA1C: Recent Labs    09/03/23 1821  HGBA1C 5.6   CBG: No results for input(s): "GLUCAP" in the last 168 hours. Lipid Profile: No results for input(s): "CHOL", "HDL", "LDLCALC", "TRIG", "CHOLHDL", "LDLDIRECT" in the last 72 hours. Thyroid Function Tests: Recent Labs    09/03/23 1821  TSH 0.577  FREET4 0.84   Anemia Panel: Recent Labs    09/05/23 0519  VITAMINB12 809  FOLATE 18.0  FERRITIN 27  TIBC 315  IRON 20*  RETICCTPCT 1.4   Sepsis Labs: No results for input(s): "PROCALCITON", "LATICACIDVEN" in the last 168 hours.  Recent Results (from the past 240 hours)  Surgical pcr screen     Status: Abnormal   Collection Time: 09/04/23 12:12 AM   Specimen: Nasal Mucosa; Nasal Swab  Result Value Ref Range Status   MRSA, PCR NEGATIVE NEGATIVE Final   Staphylococcus aureus POSITIVE (A) NEGATIVE Final    Comment: (NOTE) The Xpert SA Assay (FDA approved for NASAL specimens in patients 1 years of age and older), is one component of a comprehensive surveillance program. It is not intended to diagnose infection nor to guide or monitor treatment. Performed at [facility], [address] [place], [place] [address]          Radiology Studies: No results found.       Scheduled Meds:  Chlorhexidine Gluconate Cloth  6 each Topical Daily   diltiazem  240 mg Oral Daily   docusate sodium  100 mg Oral BID   fluticasone-salmeterol  1 puff Inhalation BID   iron polysaccharides  150 mg Oral Daily   loratadine  10 mg Oral Daily   montelukast  10 mg Oral Daily    mupirocin ointment  1 Application Nasal BID   polyethylene glycol  17 g Oral Daily   senna  1 tablet Oral BID   sodium chloride flush  3 mL Intravenous Q12H   Continuous Infusions:  iron sucrose 100 mg (09/06/23 1222)     LOS: 3 days    Time spent: 35 minutes     Denise Malachi A Anayiah Howden, MD [facility]   If 7PM-7AM, please contact night-coverage [email or web address]  09/06/2023, 12:26 PM

## 2023-09-06 NOTE — Progress Notes (Signed)
 Physical Therapy Treatment Patient Details Name: Denise Gonzales MRN: [id number] DOB: 1946-05-31 Today's Date: 09/06/2023   History of Present Illness Patient is 78 y.o. female presented [facility]03/[facility] to [facility] after a fall resulting in Rt supracondylar distal femur fracture. Pt s/p Rt ORIF on 09/04/23 and is WBAT. PMH significant for asthma, GERD, PSVT, HTN, HLD, osteopenia.    PT Comments  Pt supine in bed on arrival.  She is very anxious and fearful of pain.  Pt followed commands with max VCs for RLE exercises this session.  Pt then able to move into standing and perform gt training.  Pt largely limited in her efforts to perform bed mobility without mod assistance and RLE pain/weakness.  Pt with poor L foot clearance and R weight shifting.  Continue to recommend rehab in a post acute setting.    If plan is discharge home, recommend the following: Two people to help with walking and/or transfers;A lot of help with bathing/dressing/bathroom;Assistance with cooking/housework;Direct supervision/assist for medications management;Assist for transportation;Help with stairs or ramp for entrance   Can travel by private vehicle        Equipment Recommendations  Rolling walker (2 wheels);BSC/3in1 (youth height RW)    Recommendations for Other Services       Precautions / Restrictions Precautions Precautions: Fall Recall of Precautions/Restrictions: Intact Restrictions Weight Bearing Restrictions Per Provider Order: Yes RLE Weight Bearing Per Provider Order: Weight bearing as tolerated     Mobility  Bed Mobility Overal bed mobility: Needs Assistance Bed Mobility: Supine to Sit     Supine to sit: Used rails, Mod assist     General bed mobility comments: Pt encouraged to try using LLE hooked under RLE to assist with transfer, but due to severe c/o pain and pt internal distraction, even with technique she still required external assistance to move to edge of bed.  Use of bed pad and assistance to  elevate trunk into sitting.    Transfers Overall transfer level: Needs assistance Equipment used: Rolling walker (2 wheels) Transfers: Sit to/from Stand, Bed to chair/wheelchair/BSC Sit to Stand: Min assist           General transfer comment: Cues for hand placement to and from seated surface.  She required increased time in standing pre-gt to start to accept weight on her RLE before attempting gt training.    Ambulation/Gait Ambulation/Gait assistance: Mod assist Gait Distance (Feet): 30 Feet Assistive device: Rolling walker (2 wheels) Gait Pattern/deviations: Step-to pattern, Trunk flexed, Decreased step length - left, Decreased stance time - right       General Gait Details: Cues for upper trunk control, RW sequencing and foot clearance on L side.  Pt has difficulty sequencing due to pain and require repeated cues during session.  Pt fatigues quickly but showed improvement as evident of increased gt distance.   Stairs             Wheelchair Mobility     Tilt Bed    Modified Rankin (Stroke Patients Only)       Balance Overall balance assessment: Needs assistance Sitting-balance support: Bilateral upper extremity supported, Feet supported Sitting balance-Leahy Scale: Fair Sitting balance - Comments: tending to rely on BUE support due to pain   Standing balance support: During functional activity, Reliant on assistive device for balance, Bilateral upper extremity supported Standing balance-Leahy Scale: Poor Standing balance comment: BUE support and external assist  Communication Communication Communication: No apparent difficulties  Cognition Arousal: Alert Behavior During Therapy: WFL for tasks assessed/performed   PT - Cognitive impairments: Sequencing, Problem solving (she was having difficulty ordering her lunch post session and during session she would pause when asked to follow commands unclear if this was  cognitive or due to pain.)                       PT - Cognition Comments: Decreased insight into need to premedicate to reduce swelling and allow for more frequent mobility. Following commands: Intact      Cueing Cueing Techniques: Verbal cues, Visual cues  Exercises Total Joint Exercises Ankle Circles/Pumps: AROM, Both, 10 reps, Supine Quad Sets: AROM, Right, 10 reps, Supine Heel Slides: AAROM, Right, 10 reps, Supine Hip ABduction/ADduction: AAROM, Right, 10 reps, Supine Straight Leg Raises: AROM, Right, 10 reps, Supine    General Comments        Pertinent Vitals/Pain Pain Assessment Pain Assessment: 0-10 Pain Score: 4  Pain Location: Rt knee and thigh Pain Descriptors / Indicators: Aching, Discomfort, Guarding, Operative site guarding, Moaning, Sore, Tightness, Constant Pain Intervention(s): Monitored during session, Repositioned    Home Living                          Prior Function            PT Goals (current goals can now be found in the care plan section) Acute Rehab PT Goals Patient Stated Goal: regain mobility, stop hurting Potential to Achieve Goals: Fair Progress towards PT goals: Progressing toward goals    Frequency    Min 1X/week      PT Plan      Co-evaluation              AM-PAC PT "6 Clicks" Mobility   Outcome Measure  Help needed turning from your back to your side while in a flat bed without using bedrails?: A Lot Help needed moving from lying on your back to sitting on the side of a flat bed without using bedrails?: A Lot Help needed moving to and from a bed to a chair (including a wheelchair)?: A Lot Help needed standing up from a chair using your arms (e.g., wheelchair or bedside chair)?: A Lot Help needed to walk in hospital room?: A Lot Help needed climbing 3-5 steps with a railing? : A Lot 6 Click Score: 12    End of Session Equipment Utilized During Treatment: Gait belt Activity Tolerance: Patient  limited by pain Patient left: with call bell/phone within reach;with family/visitor present;in bed;with bed alarm set Nurse Communication: Mobility status;Precautions (RLE elevated and ice on limb) PT Visit Diagnosis: Unsteadiness on feet (R26.81);Other abnormalities of gait and mobility (R26.89);Muscle weakness (generalized) (M62.81);Difficulty in walking, not elsewhere classified (R26.2);Pain Pain - Right/Left: Right Pain - part of body: Knee     Time: 1311-1341 PT Time Calculation (min) (ACUTE ONLY): 30 min  Charges:    $Gait Training: 8-22 mins $Therapeutic Exercise: 8-22 mins PT General Charges $$ ACUTE PT VISIT: 1 Visit                     Bonney Leitz , PTA Acute Rehabilitation Services Office [phone]    Florestine Avers 09/06/2023, 1:53 PM

## 2023-09-06 NOTE — Plan of Care (Signed)

## 2023-09-06 NOTE — Progress Notes (Signed)
 Orthopaedic Trauma Service Progress Note Weekend Coverage   Patient ID: Denise Gonzales MRN: [id number] DOB/AGE: February 03, 1946 78 y.o.  Subjective:  Doing well  Pain controlled No specific complaints   No lightheadedness No SOB No CP   ROS As above  Objective:   VITALS:   Vitals:   09/05/23 1544 09/05/23 2021 09/06/23 0500 09/06/23 0918  BP: (!) 130/48 (!) 121/50 (!) 126/53 (!) 157/57  Pulse: 75 76 60 79  Resp: 17  20 17   Temp: 98.4 F (36.9 C) 98.9 F (37.2 C) 98.2 F (36.8 C)   TempSrc: Oral Oral Oral   SpO2: 98% 94% 93% 96%  Weight:   59.2 kg   Height:        Estimated body mass index is 23.87 kg/m as calculated from the following:   Height as of this encounter: 5\' 2"  (1.575 m).   Weight as of this encounter: 59.2 kg.   Intake/Output      02/21 0701 02/22 0700 02/22 0701 02/23 0700   P.O. 720    I.V. (mL/kg) 30 (0.5)    Blood 326    IV Piggyback     Total Intake(mL/kg) 1076 (18.2)    Urine (mL/kg/hr) 600 (0.4)    Emesis/NG output     Blood     Total Output 600    Net +476         Urine Occurrence 2 x      LABS  Results for orders placed or performed during the hospital encounter of 09/03/23 (from the past 24 hours)  CBC     Status: Abnormal   Collection Time: 09/06/23  4:46 AM  Result Value Ref Range   WBC 9.1 4.0 - 10.5 K/uL   RBC 2.63 (L) 3.87 - 5.11 MIL/uL   Hemoglobin 7.6 (L) 12.0 - 15.0 g/dL   HCT 57.3 (L) 22.0 - 25.4 %   MCV 87.5 80.0 - 100.0 fL   MCH 28.9 26.0 - 34.0 pg   MCHC 33.0 30.0 - 36.0 g/dL   RDW 27.0 62.3 - 76.2 %   Platelets 135 (L) 150 - 400 K/uL   nRBC 0.0 0.0 - 0.2 %     PHYSICAL EXAM:   Gen: sitting up on EOB, looks good, NAD  Lungs: unlabored  Cardiac: reg  Ext:       Right Lower Extremity Dressing is clean, dry and intact  Ace wrap removed   Mepilex stable with scant strikethrough   Extremity is warm  No DCT  Compartments are soft  No  pain out of proportion with passive stretching of his toes or ankle  DPN, SPN, TN sensory functions are intact  EHL, FHL, lesser toe motor functions intact  Ankle flexion, extension, inversion eversion intact  + DP pulse  Excellent knee ROM already    Assessment/Plan: 2 Days Post-Op   Principal Problem:   Other fracture of right femur, initial encounter for closed fracture [facility]) Active Problems:   HTN (hypertension)   Fall at home, initial encounter   Anti-infectives (From admission, onward)    Start     Dose/Rate Route Frequency Ordered Stop   09/04/23 1600  ceFAZolin (ANCEF) IVPB 2g/100 mL premix        2 g 200 mL/hr over 30 Minutes Intravenous Every 12 hours 09/04/23  8657 09/05/23 0729   09/04/23 0806  vancomycin (VANCOCIN) powder  Status:  Discontinued          As needed 09/04/23 0807 09/04/23 0851   09/04/23 0600  ceFAZolin (ANCEF) IVPB 2g/100 mL premix        2 g 200 mL/hr over 30 Minutes Intravenous On call to O.R. 09/03/23 1903 09/04/23 0817     .  POD/HD#: 2  78 y/o female s/p ORIF R distal femur fracture   Weightbearing: WBAT RLE ROM: Okay for knee ROM as tolerated Incisional and dressing care: dressing changes as needed  Showering: Okay to begin showering and getting incisions wet 09/07/2023 Orthopedic device(s): None  Pain management:  1. Tylenol 325-650 mg q 6 hours PRN 2. Robaxin 500 mg q 6 hours PRN 3. Norco 5-325 or Norco 7.5-325 mg q 4 hours PRN 4. Morphine 0.5-1 mg q 2 hours PRN VTE prophylaxis:  Hold Lovenox until hemoglobin stable , SCDs ID:  Ancef 2gm post op---> completed  Foley/Lines:  No foley, KVO IVFs Impediments to Fracture Healing: Vitamin D level 97, no additional supplementation needed. Dispo: PT/OT recommending snf.   D/C recommendations: -Norco 7.5-325 mg, Robaxin for pain control -Eliquis 2.5 mg twice daily x 30 days for DVT prophylaxis -No additional Vit D supplementation needed   Follow - up plan: 2 weeks after discharge for  wound check and repeat x-rays     Mearl Latin, PA-C [phone] (C) 09/06/2023, 11:19 AM  [facility] [address] [place] [address] [phone] [phone] (F)    After 5pm and on the weekends please log on to Amion, go to orthopaedics and the look under the [facility] Call for the provider(s) on call. You can also call our office at [phone] and then follow the prompts to be connected to the call team.  Patient ID: Denise Gonzales, female   DOB: 11-09-1945, 78 y.o.   MRN: [id number]

## 2023-09-07 DIAGNOSIS — S728X1A Other fracture of right femur, initial encounter for closed fracture: Secondary | ICD-10-CM | POA: Diagnosis not present

## 2023-09-07 LAB — BASIC METABOLIC PANEL
Anion gap: 7 (ref 5–15)
BUN: 18 mg/dL (ref 8–23)
CO2: 26 mmol/L (ref 22–32)
Calcium: 8.5 mg/dL — ABNORMAL LOW (ref 8.9–10.3)
Chloride: 104 mmol/L (ref 98–111)
Creatinine, Ser: 0.77 mg/dL (ref 0.44–1.00)
GFR, Estimated: >60 mL/min (ref >60)
Glucose, Bld: 106 mg/dL — ABNORMAL HIGH (ref 70–99)
Potassium: 3.9 mmol/L (ref 3.5–5.1)
Sodium: 137 mmol/L (ref 135–145)

## 2023-09-07 LAB — CBC
HCT: 23.9 % — ABNORMAL LOW (ref 36.0–46.0)
Hemoglobin: 7.7 g/dL — ABNORMAL LOW (ref 12.0–15.0)
MCH: 28.3 pg (ref 26.0–34.0)
MCHC: 32.2 g/dL (ref 30.0–36.0)
MCV: 87.9 fL (ref 80.0–100.0)
Platelets: 162 10*3/uL (ref 150–400)
RBC: 2.72 MIL/uL — ABNORMAL LOW (ref 3.87–5.11)
RDW: 15 % (ref 11.5–15.5)
WBC: 8.4 10*3/uL (ref 4.0–10.5)
nRBC: 0 % (ref 0.0–0.2)

## 2023-09-07 MED ORDER — HYDROCHLOROTHIAZIDE 12.5 MG PO TABS
12.5000 mg | ORAL_TABLET | Freq: Every day | ORAL | Status: DC
  Administered 2023-09-07 – 2023-09-10 (×4): 12.5 mg via ORAL
  Filled 2023-09-07 (×4): qty 1

## 2023-09-07 MED ORDER — ENOXAPARIN SODIUM 40 MG/0.4ML IJ SOSY
40.0000 mg | PREFILLED_SYRINGE | Freq: Every day | INTRAMUSCULAR | Status: DC
  Administered 2023-09-07 – 2023-09-10 (×4): 40 mg via SUBCUTANEOUS
  Filled 2023-09-07 (×4): qty 0.4

## 2023-09-07 MED ORDER — FOLIC ACID 1 MG PO TABS
1.0000 mg | ORAL_TABLET | Freq: Every day | ORAL | Status: DC
  Administered 2023-09-07 – 2023-09-10 (×4): 1 mg via ORAL
  Filled 2023-09-07 (×4): qty 1

## 2023-09-07 NOTE — Plan of Care (Signed)
  Problem: Nutrition: Goal: Adequate nutrition will be maintained Outcome: Progressing   Problem: Coping: Goal: Level of anxiety will decrease Outcome: Progressing   Problem: Pain Managment: Goal: General experience of comfort will improve and/or be controlled Outcome: Progressing   Problem: Safety: Goal: Ability to remain free from injury will improve Outcome: Progressing

## 2023-09-07 NOTE — Progress Notes (Signed)
 Occupational Therapy Treatment Patient Details Name: Denise Gonzales MRN: [id number] DOB: Feb 25, 1946 Today's Date: 09/07/2023   History of present illness Patient is 78 y.o. female presented 09/03/23 to [facility] after a fall resulting in Rt supracondylar distal femur fracture. Pt s/p Rt ORIF on 09/04/23 and is WBAT. PMH significant for asthma, GERD, PSVT, HTN, HLD, osteopenia.   OT comments  Patient received in supine and agreeable to OT treatment. Patient requiring mod assist to get to EOB due to difficulty with RLE and assistance to scoot to EOB . Patient ambulated to bathroom with RW and complaints of pain when WB through RLE. Patient able to stand at sink without seated rest break and required one extremity support to perform self care tasks. LB dressing addressed with doffing and donning socks with patient requiring assistance with RLE. Patient will benefit from continued inpatient follow up therapy, <3 hours/day. Acute OT to continue to follow to address established goals to facilitate DC to next venue of care.       If plan is discharge home, recommend the following:  A little help with walking and/or transfers;A lot of help with bathing/dressing/bathroom   Equipment Recommendations  BSC/3in1;Tub/shower seat (defer to next venue of care)    Recommendations for Other Services      Precautions / Restrictions Precautions Precautions: Fall Recall of Precautions/Restrictions: Intact Restrictions Weight Bearing Restrictions Per Provider Order: Yes RLE Weight Bearing Per Provider Order: Weight bearing as tolerated       Mobility Bed Mobility Overal bed mobility: Needs Assistance Bed Mobility: Supine to Sit     Supine to sit: Used rails, Mod assist     General bed mobility comments: required assistance with RLE and scooting to WOB    Transfers Overall transfer level: Needs assistance Equipment used: Rolling walker (2 wheels) Transfers: Sit to/from Stand, Bed to  chair/wheelchair/BSC Sit to Stand: Min assist, Contact guard assist           General transfer comment: cues for hand placement and min assist to power up. Patient was min to CGA for transfers due to pain when WB through RLE     Balance Overall balance assessment: Needs assistance Sitting-balance support: Bilateral upper extremity supported, Feet supported Sitting balance-Leahy Scale: Fair Sitting balance - Comments: EOB   Standing balance support: Single extremity supported, Bilateral upper extremity supported, During functional activity Standing balance-Leahy Scale: Poor Standing balance comment: reliant on sink or RW for support when standing.                           ADL either performed or assessed with clinical judgement   ADL Overall ADL's : Needs assistance/impaired     Grooming: Wash/dry hands;Wash/dry face;Brushing hair;Contact guard assist;Standing Grooming Details (indicate cue type and reason): at sink             Lower Body Dressing: Moderate assistance;Sitting/lateral leans Lower Body Dressing Details (indicate cue type and reason): able to doff and donn sock on LLE and required mod assist with right Toilet Transfer: Contact guard assist;Minimal assistance;Ambulation Toilet Transfer Details (indicate cue type and reason): ambulated to bathroom with CGA to min assist due to pain when WB through RLE                Extremity/Trunk Assessment Upper Extremity Assessment Upper Extremity Assessment: Overall WFL for tasks assessed            Vision  Perception     Praxis     Communication Communication Communication: No apparent difficulties   Cognition Arousal: Alert Behavior During Therapy: WFL for tasks assessed/performed Cognition: No apparent impairments                               Following commands: Intact        Cueing   Cueing Techniques: Verbal cues, Visual cues  Exercises      Shoulder  Instructions       General Comments      Pertinent Vitals/ Pain       Pain Assessment Pain Assessment: Faces Faces Pain Scale: Hurts little more Pain Location: Rt knee and thigh Pain Descriptors / Indicators: Aching, Discomfort, Guarding, Operative site guarding, Sore, Tightness Pain Intervention(s): Monitored during session, Repositioned  Home Living                                          Prior Functioning/Environment              Frequency  Min 1X/week        Progress Toward Goals  OT Goals(current goals can now be found in the care plan section)  Progress towards OT goals: Progressing toward goals  Acute Rehab OT Goals Patient Stated Goal: to get better OT Goal Formulation: With patient Time For Goal Achievement: 09/19/23 Potential to Achieve Goals: Good ADL Goals Pt Will Perform Lower Body Dressing: with contact guard assist;with adaptive equipment Pt Will Transfer to Toilet: with contact guard assist;stand pivot transfer Pt Will Perform Toileting - Clothing Manipulation and hygiene: with min assist;with adaptive equipment  Plan      Co-evaluation                 AM-PAC OT "6 Clicks" Daily Activity     Outcome Measure   Help from another person eating meals?: None Help from another person taking care of personal grooming?: A Little Help from another person toileting, which includes using toliet, bedpan, or urinal?: A Lot Help from another person bathing (including washing, rinsing, drying)?: A Lot Help from another person to put on and taking off regular upper body clothing?: A Little Help from another person to put on and taking off regular lower body clothing?: A Lot 6 Click Score: 16    End of Session Equipment Utilized During Treatment: Rolling walker (2 wheels);Gait belt  OT Visit Diagnosis: Unsteadiness on feet (R26.81);Other abnormalities of gait and mobility (R26.89)   Activity Tolerance Patient tolerated  treatment well;Patient limited by pain   Patient Left in chair;with call bell/phone within reach;with chair alarm set   Nurse Communication Mobility status        Time: 1610-9604 OT Time Calculation (min): 27 min  Charges: OT General Charges $OT Visit: 1 Visit OT Treatments $Self Care/Home Management : 23-37 mins  Alfonse Flavors, OTA Acute Rehabilitation Services  Office [phone]   Dewain Penning 09/07/2023, 12:46 PM

## 2023-09-07 NOTE — Plan of Care (Signed)
  Problem: Activity: Goal: Risk for activity intolerance will decrease Outcome: Progressing   Problem: Pain Managment: Goal: General experience of comfort will improve and/or be controlled Outcome: Progressing

## 2023-09-07 NOTE — Progress Notes (Signed)
 PROGRESS NOTE    Denise Gonzales  AVW:[id number] DOB: [birth year] DOA: 09/03/2023 PCP: Pcp, No   Brief Narrative: 78 year old with past medical history significant for allergies to Bactrim, asthma, hypertension, GERD, hyperlipidemia, osteopenia present with med [facility] after a fall.  Patient tripped and landed on her right side.  She was unable to move her leg or extended.  Evaluation in the ED CT knee showed fracture of the femur.  Comminuted extra-articular fracture of the distal femoral metaphysis with substantial comminution.   Assessment & Plan:   Principal Problem:   Other fracture of right femur, initial encounter for closed fracture [facility]) Active Problems:   HTN (hypertension)   Fall at home, initial encounter  1-Right Distal Femur Fracture: -Presented after a fall found to have right distal femoral fracture -Underwent open reduction internal fixation of right distal femur fracture by Dr. Jena Gauss 2 2/20. -Pain management with hydrocodone as needed -Scheduled laxative. -PT OT per orthopedic, may need rehab.  Had BM She will need rehab.   COPD; Stable. She prefer to use home inhaler, Advair. Order in. Continue with Singulair PRN albuterol.  HTN; Continue with Cardizem and lisinopril.  Resume HCT   Fall: -Will need PT OT eval  Anemia, Acute Blood loss anemia in post sx -Iron deficiency. Started supplement. Received one unit PRBC 2/21. 2/22: IV iron  Hb 7.7 Start folic acid, continue Nu-iron.  Leukocytosis: -WBC on admission at 12, trending down. 10 -Chest X ray: No acute finding.   Hypokalemia -Replete orally.  AKI; Monitor renal function  Estimated body mass index is 23.83 kg/m as calculated from the following:   Height as of this encounter: 5\' 2"  (1.575 m).   Weight as of this encounter: 59.1 kg.   DVT prophylaxis: Lovenox Code Status: Full code Family Communication: Care discussed with patient and daughter who was at bedside Disposition Plan:   Status is: Inpatient Remains inpatient appropriate because: Management postop femur fracture    Consultants:  Dr Jena Gauss  Procedures:  none  Antimicrobials:    Subjective: She is feeling well. She was able to go to bathroom with assistance. Pain is controlled. Denies weakness, dyspnea.   Objective: Vitals:   09/06/23 1948 09/07/23 0500 09/07/23 0531 09/07/23 0802  BP: (!) 167/59  (!) 146/49 (!) 156/61  Pulse: 84  67 76  Resp: 17   18  Temp: 98.8 F (37.1 C)  98.4 F (36.9 C) 98.3 F (36.8 C)  TempSrc: Oral  Oral Oral  SpO2: 97%  98% 100%  Weight:  59.1 kg    Height:       No intake or output data in the 24 hours ending 09/07/23 1426  Filed Weights   09/05/23 0504 09/06/23 0500 09/07/23 0500  Weight: 57.5 kg 59.2 kg 59.1 kg    Examination:  General exam: NAD Respiratory system: CTA Cardiovascular system: S 1,S 2 RRR Gastrointestinal system:  Bs present, soft,nt Central nervous system: Alert    Data Reviewed: I have personally reviewed following labs and imaging studies  CBC: Recent Labs  Lab 09/03/23 1201 09/04/23 0455 09/04/23 1101 09/05/23 0519 09/06/23 0446 09/07/23 0623  WBC 12.4* 9.0 10.7* 12.2* 9.1 8.4  NEUTROABS 10.3*  --   --   --   --   --   HGB 10.6* 8.5* 9.2* 7.3* 7.6* 7.7*  HCT 32.1* 25.3* 27.8* 22.0* 23.0* 23.9*  MCV 86.5 86.9 88.0 87.3 87.5 87.9  PLT 250 201 177 174 135* 162   Basic  Metabolic Panel: Recent Labs  Lab 09/03/23 1201 09/04/23 0455 09/04/23 1101 09/05/23 0519 09/07/23 0623  NA 134* 134*  --  138 137  K 3.7 3.4*  --  4.4 3.9  CL 101 101  --  104 104  CO2 22 24  --  23 26  GLUCOSE 136* 134*  --  145* 106*  BUN 32* 39*  --  25* 18  CREATININE 0.77 1.32* 1.16* 1.01* 0.77  CALCIUM 8.8* 8.5*  --  8.9 8.5*   GFR: Estimated Creatinine Clearance: 46.6 mL/min (by C-G formula based on SCr of 0.77 mg/dL). Liver Function Tests: Recent Labs  Lab 09/04/23 0455  AST 16  ALT 14  ALKPHOS 54  BILITOT 0.6  PROT  5.4*  ALBUMIN 3.2*   No results for input(s): "LIPASE", "AMYLASE" in the last 168 hours. No results for input(s): "AMMONIA" in the last 168 hours. Coagulation Profile: Recent Labs  Lab 09/03/23 1201  INR 1.0   Cardiac Enzymes: No results for input(s): "CKTOTAL", "CKMB", "CKMBINDEX", "TROPONINI" in the last 168 hours. BNP (last 3 results) No results for input(s): "PROBNP" in the last 8760 hours. HbA1C: No results for input(s): "HGBA1C" in the last 72 hours.  CBG: No results for input(s): "GLUCAP" in the last 168 hours. Lipid Profile: No results for input(s): "CHOL", "HDL", "LDLCALC", "TRIG", "CHOLHDL", "LDLDIRECT" in the last 72 hours. Thyroid Function Tests: No results for input(s): "TSH", "T4TOTAL", "FREET4", "T3FREE", "THYROIDAB" in the last 72 hours.  Anemia Panel: Recent Labs    09/05/23 0519  VITAMINB12 809  FOLATE 18.0  FERRITIN 27  TIBC 315  IRON 20*  RETICCTPCT 1.4   Sepsis Labs: No results for input(s): "PROCALCITON", "LATICACIDVEN" in the last 168 hours.  Recent Results (from the past 240 hours)  Surgical pcr screen     Status: Abnormal   Collection Time: 09/04/23 12:12 AM   Specimen: Nasal Mucosa; Nasal Swab  Result Value Ref Range Status   MRSA, PCR NEGATIVE NEGATIVE Final   Staphylococcus aureus POSITIVE (A) NEGATIVE Final    Comment: (NOTE) The Xpert SA Assay (FDA approved for NASAL specimens in patients 79 years of age and older), is one component of a comprehensive surveillance program. It is not intended to diagnose infection nor to guide or monitor treatment. Performed at [facility], [address], [place], [place] [address]          Radiology Studies: No results found.       Scheduled Meds:  Chlorhexidine Gluconate Cloth  6 each Topical Daily   diltiazem  240 mg Oral Daily   docusate sodium  100 mg Oral BID   enoxaparin (LOVENOX) injection  40 mg Subcutaneous Daily   fluticasone-salmeterol  1 puff Inhalation BID    folic acid  1 mg Oral Daily   iron polysaccharides  150 mg Oral Daily   loratadine  10 mg Oral Daily   montelukast  10 mg Oral Daily   mupirocin ointment  1 Application Nasal BID   polyethylene glycol  17 g Oral Daily   senna  1 tablet Oral BID   sodium chloride flush  3 mL Intravenous Q12H   Continuous Infusions:     LOS: 4 days    Time spent: 35 minutes     George Alcantar A Avalon Coppinger, MD [facility]   If 7PM-7AM, please contact night-coverage [email or web address]  09/07/2023, 2:26 PM

## 2023-09-07 NOTE — Progress Notes (Signed)
     Subjective:  Patient reports pain as mild.  No complaints.  Walked 30 feet.  Objective:   VITALS:   Vitals:   09/06/23 1948 09/07/23 0500 09/07/23 0531 09/07/23 0802  BP: (!) 167/59  (!) 146/49 (!) 156/61  Pulse: 84  67 76  Resp: 17   18  Temp: 98.8 F (37.1 C)  98.4 F (36.9 C) 98.3 F (36.8 C)  TempSrc: Oral  Oral Oral  SpO2: 97%  98% 100%  Weight:  59.1 kg    Height:        Neurologically intact Sensation intact distally Dorsiflexion/Plantar flexion intact Incision: scant drainage   Lab Results  Component Value Date   WBC 8.4 09/07/2023   HGB 7.7 (L) 09/07/2023   HCT 23.9 (L) 09/07/2023   MCV 87.9 09/07/2023   PLT 162 09/07/2023   BMET    Component Value Date/Time   NA 137 09/07/2023 0623   K 3.9 09/07/2023 0623   CL 104 09/07/2023 0623   CO2 26 09/07/2023 0623   GLUCOSE 106 (H) 09/07/2023 0623   BUN 18 09/07/2023 0623   CREATININE 0.77 09/07/2023 0623   CALCIUM 8.5 (L) 09/07/2023 0623   GFRNONAA >60 09/07/2023 1610     Assessment/Plan: 3 Days Post-Op   Principal Problem:   Other fracture of right femur, initial encounter for closed fracture ([facility]) Active Problems:   HTN (hypertension)   Fall at home, initial encounter   Advance diet Up with therapy  Eulas Post 09/07/2023, 10:17 AM  Weightbearing: WBAT RLE ROM: Okay for knee ROM as tolerated Incisional and dressing care: dressing changes as needed  Showering: Okay to begin showering and getting incisions wet 09/07/2023 Orthopedic device(s): None  Pain management:  1. Tylenol 325-650 mg q 6 hours PRN 2. Robaxin 500 mg q 6 hours PRN 3. Norco 5-325 or Norco 7.5-325 mg q 4 hours PRN 4. Morphine 0.5-1 mg q 2 hours PRN VTE prophylaxis:  Hold Lovenox until hemoglobin stable , SCDs ID:  Ancef 2gm post op---> completed  Foley/Lines:  No foley, KVO IVFs Impediments to Fracture Healing: Vitamin D level 97, no additional supplementation needed. Dispo: PT/OT recommending snf.    D/C  recommendations: -Norco 7.5-325 mg, Robaxin for pain control -Eliquis 2.5 mg twice daily x 30 days for DVT prophylaxis -No additional Vit D supplementation needed   Follow - up plan: 2 weeks after discharge for wound check and repeat x-rays   Teryl Lucy, MD Cell [phone]

## 2023-09-08 ENCOUNTER — Other Ambulatory Visit (HOSPITAL_COMMUNITY): Payer: Self-pay

## 2023-09-08 ENCOUNTER — Telehealth (HOSPITAL_COMMUNITY): Payer: Self-pay | Admitting: Pharmacy Technician

## 2023-09-08 DIAGNOSIS — S72421A Displaced fracture of lateral condyle of right femur, initial encounter for closed fracture: Secondary | ICD-10-CM

## 2023-09-08 DIAGNOSIS — S72401A Unspecified fracture of lower end of right femur, initial encounter for closed fracture: Secondary | ICD-10-CM

## 2023-09-08 DIAGNOSIS — S72431A Displaced fracture of medial condyle of right femur, initial encounter for closed fracture: Secondary | ICD-10-CM | POA: Diagnosis not present

## 2023-09-08 DIAGNOSIS — S72001A Fracture of unspecified part of neck of right femur, initial encounter for closed fracture: Secondary | ICD-10-CM | POA: Insufficient documentation

## 2023-09-08 LAB — CBC
HCT: 25.5 % — ABNORMAL LOW (ref 36.0–46.0)
Hemoglobin: 8.3 g/dL — ABNORMAL LOW (ref 12.0–15.0)
MCH: 28.9 pg (ref 26.0–34.0)
MCHC: 32.5 g/dL (ref 30.0–36.0)
MCV: 88.9 fL (ref 80.0–100.0)
Platelets: 137 10*3/uL — ABNORMAL LOW (ref 150–400)
RBC: 2.87 MIL/uL — ABNORMAL LOW (ref 3.87–5.11)
RDW: 14.6 % (ref 11.5–15.5)
WBC: 7.4 10*3/uL (ref 4.0–10.5)
nRBC: 0 % (ref 0.0–0.2)

## 2023-09-08 MED ORDER — HYDROCODONE-ACETAMINOPHEN 7.5-325 MG PO TABS: 1.0000 | ORAL_TABLET | ORAL | 0 refills | Status: AC | PRN

## 2023-09-08 MED ORDER — FOLIC ACID 1 MG PO TABS: 1.0000 mg | ORAL_TABLET | Freq: Every day | ORAL | 0 refills | Status: AC

## 2023-09-08 MED ORDER — MUPIROCIN 2 % EX OINT: 1.0000 | TOPICAL_OINTMENT | Freq: Two times a day (BID) | CUTANEOUS | 0 refills | Status: AC

## 2023-09-08 MED ORDER — METHOCARBAMOL 500 MG PO TABS: 500.0000 mg | ORAL_TABLET | Freq: Three times a day (TID) | ORAL | 0 refills | Status: AC | PRN

## 2023-09-08 MED ORDER — SENNA 8.6 MG PO TABS: 1.0000 | ORAL_TABLET | Freq: Two times a day (BID) | ORAL | 0 refills | Status: AC

## 2023-09-08 MED ORDER — ACETAMINOPHEN 325 MG PO TABS: 325.0000 mg | ORAL_TABLET | Freq: Four times a day (QID) | ORAL | 0 refills | Status: AC | PRN

## 2023-09-08 MED ORDER — POLYETHYLENE GLYCOL 3350 17 G PO PACK: 17.0000 g | PACK | Freq: Every day | ORAL | 0 refills | Status: AC

## 2023-09-08 MED ORDER — APIXABAN 2.5 MG PO TABS
2.5000 mg | ORAL_TABLET | Freq: Two times a day (BID) | ORAL | 0 refills | Status: AC
Start: 2023-09-08 — End: 2024-04-22

## 2023-09-08 MED ORDER — DOCUSATE SODIUM 100 MG PO CAPS: 100.0000 mg | ORAL_CAPSULE | Freq: Two times a day (BID) | ORAL | 0 refills | Status: AC

## 2023-09-08 MED ORDER — POLYSACCHARIDE IRON COMPLEX 150 MG PO CAPS: 150.0000 mg | ORAL_CAPSULE | Freq: Every day | ORAL | 0 refills | Status: AC

## 2023-09-08 NOTE — Discharge Summary (Signed)
 Physician Discharge Summary   Patient: Denise Gonzales MRN: [id number] DOB: Aug 12, 1945  Admit date:     09/03/2023  Discharge date: 09/08/23  Discharge Physician: Jon Billings A Quintyn Dombek   PCP: Pcp, No   Recommendations at discharge:    Needs to follow up with Dr Jena Gauss in 2 weeks for wound check and X ray.  Weightbearing: WBAT RLE ROM: Okay for knee ROM as tolerated Incisional and dressing care: dressing changes as needed  Needs CBC to follow hb. Needs evaluation of iron deficiency.   Discharge Diagnoses: Principal Problem:   Closed fracture of right distal femur (HCC) Active Problems:   Other fracture of right femur, initial encounter for closed fracture (HCC)   HTN (hypertension)   Fall at home, initial encounter  Resolved Problems:   * No resolved hospital problems. *  Hospital Course: 78 year old with past medical history significant for allergies to Bactrim, asthma, hypertension, GERD, hyperlipidemia, osteopenia present with med [facility] after a fall. Patient tripped and landed on her right side. She was unable to move her leg or extended. Evaluation in the ED CT knee showed fracture of the femur. Comminuted extra-articular fracture of the distal femoral metaphysis with substantial comminution.   Assessment and Plan: 1-Right Distal Femur Fracture: -Presented after a fall found to have right distal femoral fracture -Underwent open reduction internal fixation of right distal femur fracture by Dr. Jena Gauss  2/20. -Pain management with hydrocodone as needed -Scheduled laxative. -PT OT per orthopedic, may need rehab.  Had BM She will need rehab.  Stable for rehab  COPD; Stable. She prefer to use home inhaler, Advair. Order in. Continue with Singulair PRN albuterol.   HTN; Continue with Cardizem and lisinopril.  Resume HCTZ    Fall: -PT, OT recommend rehab.   Anemia, Acute Blood loss anemia in post sx -Iron deficiency. Started supplement. Received one unit PRBC  2/21. 2/22: IV iron  Hb 7.7--8 Started  folic acid, continue Nu-iron.   Leukocytosis: -WBC on admission at 12, trending down. 10 -Chest X ray: No acute finding.    Hypokalemia -Replete orally.   AKI; Monitor renal function   Estimated body mass index is 23.83 kg/m as calculated from the following:   Height as of this encounter: 5\' 2"  (1.575 m).   Weight as of this encounter: 59.1 kg.          Consultants: Dr Jena Gauss Procedures performed: Underwent open reduction internal fixation of right distal femur fracture by Dr. Jena Gauss 2 2/20. Disposition: Skilled nursing facility Diet recommendation:  Discharge Diet Orders (From admission, onward)     Start     Ordered   09/08/23 0000  Diet - low sodium heart healthy        09/08/23 1039           Cardiac diet DISCHARGE MEDICATION: Allergies as of 09/08/2023       Reactions   Bactrim [sulfamethoxazole-trimethoprim] Other (See Comments)   Pt felt like she was having a heart attack.         Medication List     STOP taking these medications    lidocaine 5 % Commonly known as: Lidoderm   OVER THE COUNTER MEDICATION       TAKE these medications    acetaminophen 325 MG tablet Commonly known as: TYLENOL Take 1-2 tablets (325-650 mg total) by mouth every 6 (six) hours as needed for mild pain (pain score 1-3) (or temp > 100.5).   albuterol 108 (90 Base) MCG/ACT  inhaler Commonly known as: ProAir HFA Inhale 2 puffs into the lungs every 4 (four) hours as needed.   apixaban 2.5 MG Tabs tablet Commonly known as: Eliquis Take 1 tablet (2.5 mg total) by mouth 2 (two) times daily.   atorvastatin 20 MG tablet Commonly known as: LIPITOR Take 1 tablet by mouth daily.   b complex vitamins tablet Take 1 tablet by mouth daily.   CENTRUM SILVER PO Take 1 tablet by mouth daily.   chlorhexidine 4 % external liquid Commonly known as: HIBICLENS Apply 15 mLs (1 Application total) topically as directed for 30 doses. Use  as directed daily for 5 days every other week for 6 weeks.   diltiazem 240 MG 24 hr capsule Commonly known as: CARDIZEM CD Take 240 mg by mouth daily.   docusate sodium 100 MG capsule Commonly known as: COLACE Take 1 capsule (100 mg total) by mouth 2 (two) times daily.   FISH OIL PO Take 1 tablet by mouth daily.   fluticasone 50 MCG/ACT nasal spray Commonly known as: FLONASE Place 2 sprays into both nostrils daily as needed for allergies or rhinitis.   fluticasone-salmeterol 100-50 MCG/ACT Aepb Commonly known as: ADVAIR INHALE ONE PUFF BY MOUTH TWICE A DAY What changed:  how much to take how to take this when to take this   folic acid 1 MG tablet Commonly known as: FOLVITE Take 1 tablet (1 mg total) by mouth daily. Start taking on: September 09, 2023   hydrochlorothiazide 12.5 MG tablet Commonly known as: HYDRODIURIL Take 12.5 mg by mouth daily.   HYDROcodone-acetaminophen 7.5-325 MG tablet Commonly known as: NORCO Take 1 tablet by mouth every 4 (four) hours as needed for severe pain (pain score 7-10).   iron polysaccharides 150 MG capsule Commonly known as: NIFEREX Take 1 capsule (150 mg total) by mouth daily. Start taking on: September 09, 2023   lisinopril 40 MG tablet Commonly known as: ZESTRIL Take 40 mg by mouth daily.   loratadine 10 MG tablet Commonly known as: CLARITIN Take 10 mg by mouth daily.   methocarbamol 500 MG tablet Commonly known as: ROBAXIN Take 1 tablet (500 mg total) by mouth every 8 (eight) hours as needed for muscle spasms.   montelukast 10 MG tablet Commonly known as: SINGULAIR Take 1 tablet (10 mg total) by mouth daily.   mupirocin ointment 2 % Commonly known as: BACTROBAN Place 1 Application into the nose 2 (two) times daily for 60 doses. Use as directed 2 times daily for 5 days every other week for 6 weeks.   polyethylene glycol 17 g packet Commonly known as: MIRALAX / GLYCOLAX Take 17 g by mouth daily. Start taking on:  September 09, 2023   senna 8.6 MG Tabs tablet Commonly known as: SENOKOT Take 1 tablet (8.6 mg total) by mouth 2 (two) times daily.   Vitamin D (Ergocalciferol) 1.25 MG (50000 UNIT) Caps capsule Commonly known as: DRISDOL Take 50,000 Units by mouth once a week.               Discharge Care Instructions  (From admission, onward)           Start     Ordered   09/08/23 0000  Discharge wound care:       Comments: See above   09/08/23 1039            Follow-up Information     Haddix, Gillie Manners, MD. Schedule an appointment as soon as possible for a visit in  2 week(s).   Specialty: Orthopedic Surgery Why: for wound check and repeat x-rays Contact information: [address] [place] [address] [phone]                Discharge Exam: Ceasar Mons Weights   09/05/23 0504 09/06/23 0500 09/07/23 0500  Weight: 57.5 kg 59.2 kg 59.1 kg   General; NAD  Condition at discharge: stable  The results of significant diagnostics from this hospitalization (including imaging, microbiology, ancillary and laboratory) are listed below for reference.   Imaging Studies: DG Knee Right Port Result Date: 09/04/2023 CLINICAL DATA:  Distal right femoral fracture. EXAM: PORTABLE RIGHT KNEE - 1-2 VIEW COMPARISON:  Right knee radiographs 09/03/2023, CT right knee 09/03/2023 FINDINGS: There is diffuse decreased bone mineralization. Interval long lateral plate and screw fixation of the distal femur including the patient seen markedly comminuted acute distal femoral diaphyseal through lateral femoral condyle fracture. There is approximately 5 mm medial displacement of a dominant medial cortical butterfly fracture fragment, similar to prior. Improvement in multiple lateral distal femoral metadiaphyseal cortical fracture fragment alignment. Mild posterior displacement of a portion of a distal posterior femoral metadiaphyseal cortical fracture fragment. No evidence of hardware failure.  IMPRESSION: Interval long lateral plate and screw fixation of the distal femur including the patient seen markedly comminuted acute distal femoral diaphyseal through lateral femoral condyle fracture. Electronically Signed   By: Neita Garnet M.D.   On: 09/04/2023 10:50   DG FEMUR, MIN 2 VIEWS RIGHT Result Date: 09/04/2023 CLINICAL DATA:  Open reduction and internal fixation of distal right femoral fracture. EXAM: RIGHT FEMUR 2 VIEWS; DG C-ARM 1-60 MIN-NO REPORT Radiation exposure index: 2.52 mGy. COMPARISON:  September 03, 2023. FINDINGS: Seven intraoperative fluoroscopic images were obtained of the distal right femur. These demonstrate surgical internal fixation of distal right femoral fracture. IMPRESSION: Fluoroscopic guidance provided during surgical internal fixation of distal right femoral fracture. Electronically Signed   By: Lupita Raider M.D.   On: 09/04/2023 08:45   DG C-Arm 1-60 Min-No Report Result Date: 09/04/2023 Fluoroscopy was utilized by the requesting physician.  No radiographic interpretation.   DG Chest 2 View Result Date: 09/03/2023 CLINICAL DATA:  Preop chest exam.  Femur fracture. EXAM: CHEST - 2 VIEW COMPARISON:  04/23/2017 FINDINGS: The cardiomediastinal contours are normal. Chronic coarsened lung markings. Pulmonary vasculature is normal. No consolidation, pleural effusion, or pneumothorax. No acute osseous abnormalities are seen. The bones are diffusely under mineralized. IMPRESSION: No acute findings. Electronically Signed   By: Narda Rutherford M.D.   On: 09/03/2023 21:00   CT Knee Right Wo Contrast Result Date: 09/03/2023 CLINICAL DATA:  Comminuted fracture distal femur EXAM: CT OF THE RIGHT KNEE WITHOUT CONTRAST TECHNIQUE: Multidetector CT imaging of the right knee was performed according to the standard protocol. Multiplanar CT image reconstructions were also generated. RADIATION DOSE REDUCTION: This exam was performed according to the departmental dose-optimization  program which includes automated exposure control, adjustment of the mA and/or kV according to patient size and/or use of iterative reconstruction technique. COMPARISON:  Radiographs 09/03/2023 FINDINGS: Bones/Joint/Cartilage Bony demineralization. OTA 33 A3 extra-articular fracture of the distal femoral metaphysis with substantial comminution. Although components of the fracture extend close to the articular surface, for example anterolaterally near the margin of the lateral femoral trochlear groove, and posteriorly towards the intercondylar notch, no definite extension to the weight-bearing surfaces or definite involvement of the trochlear articular surface is observed. Meniscal chondrocalcinosis compatible with CPPD arthropathy. Ligaments Suboptimally assessed by CT. Muscles and  Tendons Edema tracks along fascia planes in the vicinity of the fractures, including along the vastus musculature Soft tissues Infiltrative edema in the popliteal space especially superiorly. IMPRESSION: 1. Comminuted extra-articular fracture of the distal femoral metaphysis with substantial comminution. Although components of the fracture extend close to the articular surface, no definite extension to the weight-bearing surfaces or definite involvement of the trochlear articular surface is observed. 2. Meniscal chondrocalcinosis compatible with CPPD arthropathy. 3. Bony demineralization. 4. Infiltrative edema in the popliteal space especially superiorly. Electronically Signed   By: Gaylyn Rong M.D.   On: 09/03/2023 13:18   DG Knee Complete 4 Views Right Result Date: 09/03/2023 CLINICAL DATA:  Fall.  Right knee pain and swelling. EXAM: RIGHT KNEE - COMPLETE 4+ VIEW COMPARISON:  None Available. FINDINGS: There is acute, comminuted angulated fracture of the distal right femur. No discrete intra-articular extension seen. No other acute fracture or dislocation. No aggressive osseous lesion. There are degenerative changes of the knee  joint in the form of mildly reduced tibio-femoral compartment joint space and osteophytosis. No knee effusion or focal soft tissue swelling. No radiopaque foreign bodies. IMPRESSION: *Acute comminuted fractures of the distal right femur. Electronically Signed   By: Jules Schick M.D.   On: 09/03/2023 12:13   CT Cervical Spine Wo Contrast Result Date: 09/03/2023 CLINICAL DATA:  78 year old female status post trip and fall this morning. Pain and swelling. EXAM: CT CERVICAL SPINE WITHOUT CONTRAST TECHNIQUE: Multidetector CT imaging of the cervical spine was performed without intravenous contrast. Multiplanar CT image reconstructions were also generated. RADIATION DOSE REDUCTION: This exam was performed according to the departmental dose-optimization program which includes automated exposure control, adjustment of the mA and/or kV according to patient size and/or use of iterative reconstruction technique. COMPARISON:  Head CT today.  CTA head and neck 12/27/2019. FINDINGS: Alignment: Chronic straightening of cervical lordosis. Cervicothoracic junction alignment is within normal limits. Bilateral posterior element alignment is within normal limits. Skull base and vertebrae: Stable bone mineralization since 2021. Visualized skull base is intact. No atlanto-occipital dissociation. C1 and C2 appear intact and aligned. No acute osseous abnormality identified. Soft tissues and spinal canal: No prevertebral fluid or swelling. No visible canal hematoma. Negative visible noncontrast neck soft tissues aside from calcified carotid atherosclerosis. Disc levels: Chronic cervical spine degeneration appears stable since 2021, fairly age-appropriate. Upper chest: Upper thoracic vertebrae appear intact. Chronic apical lung scarring is stable. IMPRESSION: 1. No acute traumatic injury identified in the cervical spine. 2. Stable cervical spine degeneration since 2021. Electronically Signed   By: Odessa Fleming M.D.   On: 09/03/2023 11:46    CT Head Wo Contrast Result Date: 09/03/2023 CLINICAL DATA:  78 year old female status post trip and fall this morning. Pain and swelling. EXAM: CT HEAD WITHOUT CONTRAST TECHNIQUE: Contiguous axial images were obtained from the base of the skull through the vertex without intravenous contrast. RADIATION DOSE REDUCTION: This exam was performed according to the departmental dose-optimization program which includes automated exposure control, adjustment of the mA and/or kV according to patient size and/or use of iterative reconstruction technique. COMPARISON:  Head CT 12/27/2019. FINDINGS: Brain: Cerebral volume loss since 2021 appears to be generalized. No midline shift, ventriculomegaly, mass effect, evidence of mass lesion, intracranial hemorrhage or evidence of cortically based acute infarction. Chronic but progressed, Patchy and confluent bilateral cerebral white matter hypodensity, moderate for age. Vascular: No suspicious intracranial vascular hyperdensity. Calcified atherosclerosis at the skull base. Skull: Stable, intact. Sinuses/Orbits: Visualized paranasal sinuses and mastoids are clear. Other:  Visualized orbits and scalp soft tissues are within normal limits. IMPRESSION: 1. No acute intracranial abnormality or acute traumatic injury identified. 2. Generalized cerebral volume loss and progressed chronic white matter disease since 2021. Electronically Signed   By: Odessa Fleming M.D.   On: 09/03/2023 11:43    Microbiology: Results for orders placed or performed during the hospital encounter of 09/03/23  Surgical pcr screen     Status: Abnormal   Collection Time: 09/04/23 12:12 AM   Specimen: Nasal Mucosa; Nasal Swab  Result Value Ref Range Status   MRSA, PCR NEGATIVE NEGATIVE Final   Staphylococcus aureus POSITIVE (A) NEGATIVE Final    Comment: (NOTE) The Xpert SA Assay (FDA approved for NASAL specimens in patients 73 years of age and older), is one component of a comprehensive surveillance  program. It is not intended to diagnose infection nor to guide or monitor treatment. Performed at [facility], [address] [place], [place] [address]     Labs: CBC: Recent Labs  Lab 09/03/23 1201 09/04/23 0455 09/04/23 1101 09/05/23 0519 09/06/23 0446 09/07/23 0623 09/08/23 0647  WBC 12.4*   < > 10.7* 12.2* 9.1 8.4 7.4  NEUTROABS 10.3*  --   --   --   --   --   --   HGB 10.6*   < > 9.2* 7.3* 7.6* 7.7* 8.3*  HCT 32.1*   < > 27.8* 22.0* 23.0* 23.9* 25.5*  MCV 86.5   < > 88.0 87.3 87.5 87.9 88.9  PLT 250   < > 177 174 135* 162 137*   < > = values in this interval not displayed.   Basic Metabolic Panel: Recent Labs  Lab 09/03/23 1201 09/04/23 0455 09/04/23 1101 09/05/23 0519 09/07/23 0623  NA 134* 134*  --  138 137  K 3.7 3.4*  --  4.4 3.9  CL 101 101  --  104 104  CO2 22 24  --  23 26  GLUCOSE 136* 134*  --  145* 106*  BUN 32* 39*  --  25* 18  CREATININE 0.77 1.32* 1.16* 1.01* 0.77  CALCIUM 8.8* 8.5*  --  8.9 8.5*   Liver Function Tests: Recent Labs  Lab 09/04/23 0455  AST 16  ALT 14  ALKPHOS 54  BILITOT 0.6  PROT 5.4*  ALBUMIN 3.2*   CBG: No results for input(s): "GLUCAP" in the last 168 hours.  Discharge time spent: greater than 30 minutes.  Signed: Alba Cory, MD [facility] 09/08/2023

## 2023-09-08 NOTE — Telephone Encounter (Signed)

## 2023-09-08 NOTE — Plan of Care (Signed)
  Problem: Education: Goal: Knowledge of General Education information will improve Description: Including pain rating scale, medication(s)/side effects and non-pharmacologic comfort measures Outcome: Progressing   Problem: Clinical Measurements: Goal: Ability to maintain clinical measurements within normal limits will improve Outcome: Progressing   Problem: Skin Integrity: Goal: Risk for impaired skin integrity will decrease Outcome: Progressing   Problem: Clinical Measurements: Goal: Postoperative complications will be avoided or minimized Outcome: Progressing

## 2023-09-08 NOTE — TOC Progression Note (Signed)
 Transition of Care [facility]) - Progression Note    Patient Details  Name: Denise Gonzales MRN: [id number] Date of Birth: 19-Jun-1946  [facility] CM/SW Contact  Lorri Frederick, LCSW Phone Number: 09/08/2023, 11:54 AM  Clinical Narrative:   Bed offers provided to pt and daughter Manuela Neptune on medicare choice document.  They will review.  1145: CSW spoke with them again, they are interested in [facility] and [facility], planning to visit them today.   Auth request submitted in [place] with facility choice pending.  Expected Discharge Plan: Skilled Nursing Facility Barriers to Discharge: Continued Medical Work up, SNF Pending bed offer  Expected Discharge Plan and Services In-house Referral: Clinical Social Work   Post Acute Care Choice: Skilled Nursing Facility Living arrangements for the past 2 months: Single Family Home Expected Discharge Date: 09/08/23                                     Social Determinants of Health (SDOH) Interventions SDOH Screenings   Food Insecurity: No Food Insecurity (09/03/2023)  Housing: Low Risk  (09/03/2023)  Transportation Needs: No Transportation Needs (09/03/2023)  Utilities: Not At Risk (09/03/2023)  Social Connections: Socially Integrated (09/03/2023)  Tobacco Use: Low Risk  (09/04/2023)    Readmission Risk Interventions     No data to display

## 2023-09-09 DIAGNOSIS — S72431A Displaced fracture of medial condyle of right femur, initial encounter for closed fracture: Secondary | ICD-10-CM | POA: Diagnosis not present

## 2023-09-09 DIAGNOSIS — S72421A Displaced fracture of lateral condyle of right femur, initial encounter for closed fracture: Secondary | ICD-10-CM | POA: Diagnosis not present

## 2023-09-09 NOTE — Plan of Care (Signed)

## 2023-09-09 NOTE — Discharge Summary (Signed)
 Physician Discharge Summary   Patient: Denise Gonzales MRN: [id number] DOB: 1946/02/26  Admit date:     09/03/2023  Discharge date: 09/09/23  Discharge Physician: Jon Billings A Timber Marshman   PCP: Pcp, No   Recommendations at discharge:    Needs to follow up with Dr Jena Gauss in 2 weeks for wound check and X ray.  Weightbearing: WBAT RLE ROM: Okay for knee ROM as tolerated Incisional and dressing care: dressing changes as needed  Needs CBC to follow hb. Needs evaluation of iron deficiency.   Discharge Diagnoses: Principal Problem:   Closed fracture of right distal femur (HCC) Active Problems:   Other fracture of right femur, initial encounter for closed fracture (HCC)   HTN (hypertension)   Fall at home, initial encounter  Resolved Problems:   * No resolved hospital problems. *  Hospital Course: 78 year old with past medical history significant for allergies to Bactrim, asthma, hypertension, GERD, hyperlipidemia, osteopenia present with med [facility] after a fall. Patient tripped and landed on her right side. She was unable to move her leg or extended. Evaluation in the ED CT knee showed fracture of the femur. Comminuted extra-articular fracture of the distal femoral metaphysis with substantial comminution.   Assessment and Plan: 1-Right Distal Femur Fracture: -Presented after a fall found to have right distal femoral fracture -Underwent open reduction internal fixation of right distal femur fracture by Dr. Jena Gauss  2/20. -Pain management with hydrocodone as needed -Scheduled laxative. -PT OT per orthopedic, may need rehab.  Had BM She will need rehab.  Stable for rehab  COPD; Stable. She prefer to use home inhaler, Advair. Order in. Continue with Singulair PRN albuterol.   HTN; Continue with Cardizem and lisinopril.  Resume HCTZ    Fall: -PT, OT recommend rehab.   Anemia, Acute Blood loss anemia in post sx -Iron deficiency. Started supplement. Received one unit PRBC  2/21. 2/22: IV iron  Hb 7.7--8 Started  folic acid, continue Nu-iron.   Leukocytosis: -WBC on admission at 12, trending down. 10 -Chest X ray: No acute finding.    Hypokalemia -Replete orally.   AKI; Monitor renal function    No changes in medical condition stable for discharge   Estimated body mass index is 23.83 kg/m as calculated from the following:   Height as of this encounter: 5\' 2"  (1.575 m).   Weight as of this encounter: 59.1 kg.          Consultants: Dr Jena Gauss Procedures performed: Underwent open reduction internal fixation of right distal femur fracture by Dr. Jena Gauss 2 2/20. Disposition: Skilled nursing facility Diet recommendation:  Discharge Diet Orders (From admission, onward)     Start     Ordered   09/08/23 0000  Diet - low sodium heart healthy        09/08/23 1039           Cardiac diet DISCHARGE MEDICATION: Allergies as of 09/09/2023       Reactions   Bactrim [sulfamethoxazole-trimethoprim] Other (See Comments)   Pt felt like she was having a heart attack.         Medication List     STOP taking these medications    lidocaine 5 % Commonly known as: Lidoderm   OVER THE COUNTER MEDICATION       TAKE these medications    acetaminophen 325 MG tablet Commonly known as: TYLENOL Take 1-2 tablets (325-650 mg total) by mouth every 6 (six) hours as needed for mild pain (pain score 1-3) (  or temp > 100.5).   albuterol 108 (90 Base) MCG/ACT inhaler Commonly known as: ProAir HFA Inhale 2 puffs into the lungs every 4 (four) hours as needed.   apixaban 2.5 MG Tabs tablet Commonly known as: Eliquis Take 1 tablet (2.5 mg total) by mouth 2 (two) times daily.   atorvastatin 20 MG tablet Commonly known as: LIPITOR Take 1 tablet by mouth daily.   b complex vitamins tablet Take 1 tablet by mouth daily.   CENTRUM SILVER PO Take 1 tablet by mouth daily.   chlorhexidine 4 % external liquid Commonly known as: HIBICLENS Apply 15 mLs (1  Application total) topically as directed for 30 doses. Use as directed daily for 5 days every other week for 6 weeks.   diltiazem 240 MG 24 hr capsule Commonly known as: CARDIZEM CD Take 240 mg by mouth daily.   docusate sodium 100 MG capsule Commonly known as: COLACE Take 1 capsule (100 mg total) by mouth 2 (two) times daily.   FISH OIL PO Take 1 tablet by mouth daily.   fluticasone 50 MCG/ACT nasal spray Commonly known as: FLONASE Place 2 sprays into both nostrils daily as needed for allergies or rhinitis.   fluticasone-salmeterol 100-50 MCG/ACT Aepb Commonly known as: ADVAIR INHALE ONE PUFF BY MOUTH TWICE A DAY What changed:  how much to take how to take this when to take this   folic acid 1 MG tablet Commonly known as: FOLVITE Take 1 tablet (1 mg total) by mouth daily.   hydrochlorothiazide 12.5 MG tablet Commonly known as: HYDRODIURIL Take 12.5 mg by mouth daily.   HYDROcodone-acetaminophen 7.5-325 MG tablet Commonly known as: NORCO Take 1 tablet by mouth every 4 (four) hours as needed for severe pain (pain score 7-10).   iron polysaccharides 150 MG capsule Commonly known as: NIFEREX Take 1 capsule (150 mg total) by mouth daily.   lisinopril 40 MG tablet Commonly known as: ZESTRIL Take 40 mg by mouth daily.   loratadine 10 MG tablet Commonly known as: CLARITIN Take 10 mg by mouth daily.   methocarbamol 500 MG tablet Commonly known as: ROBAXIN Take 1 tablet (500 mg total) by mouth every 8 (eight) hours as needed for muscle spasms.   montelukast 10 MG tablet Commonly known as: SINGULAIR Take 1 tablet (10 mg total) by mouth daily.   mupirocin ointment 2 % Commonly known as: BACTROBAN Place 1 Application into the nose 2 (two) times daily for 60 doses. Use as directed 2 times daily for 5 days every other week for 6 weeks.   polyethylene glycol 17 g packet Commonly known as: MIRALAX / GLYCOLAX Take 17 g by mouth daily.   senna 8.6 MG Tabs  tablet Commonly known as: SENOKOT Take 1 tablet (8.6 mg total) by mouth 2 (two) times daily.   Vitamin D (Ergocalciferol) 1.25 MG (50000 UNIT) Caps capsule Commonly known as: DRISDOL Take 50,000 Units by mouth once a week.               Discharge Care Instructions  (From admission, onward)           Start     Ordered   09/08/23 0000  Discharge wound care:       Comments: See above   09/08/23 1039            Contact information for follow-up providers     Haddix, Gillie Manners, MD. Schedule an appointment as soon as possible for a visit in 2 week(s).  Specialty: Orthopedic Surgery Why: for wound check and repeat x-rays Contact information: [address] [place] [address] [phone]              Contact information for after-discharge care     Destination     [facility], [facility] Preferred SNF .   Service: Skilled Nursing Contact information: [address] [place] [address] [phone]                    Discharge Exam: Ceasar Mons Weights   09/06/23 0500 09/07/23 0500 09/09/23 0343  Weight: 59.2 kg 59.1 kg 57.5 kg   General; NAD  Condition at discharge: stable  The results of significant diagnostics from this hospitalization (including imaging, microbiology, ancillary and laboratory) are listed below for reference.   Imaging Studies: DG Knee Right Port Result Date: 09/04/2023 CLINICAL DATA:  Distal right femoral fracture. EXAM: PORTABLE RIGHT KNEE - 1-2 VIEW COMPARISON:  Right knee radiographs 09/03/2023, CT right knee 09/03/2023 FINDINGS: There is diffuse decreased bone mineralization. Interval long lateral plate and screw fixation of the distal femur including the patient seen markedly comminuted acute distal femoral diaphyseal through lateral femoral condyle fracture. There is approximately 5 mm medial displacement of a dominant medial cortical butterfly fracture fragment, similar to prior.  Improvement in multiple lateral distal femoral metadiaphyseal cortical fracture fragment alignment. Mild posterior displacement of a portion of a distal posterior femoral metadiaphyseal cortical fracture fragment. No evidence of hardware failure. IMPRESSION: Interval long lateral plate and screw fixation of the distal femur including the patient seen markedly comminuted acute distal femoral diaphyseal through lateral femoral condyle fracture. Electronically Signed   By: Neita Garnet M.D.   On: 09/04/2023 10:50   DG FEMUR, MIN 2 VIEWS RIGHT Result Date: 09/04/2023 CLINICAL DATA:  Open reduction and internal fixation of distal right femoral fracture. EXAM: RIGHT FEMUR 2 VIEWS; DG C-ARM 1-60 MIN-NO REPORT Radiation exposure index: 2.52 mGy. COMPARISON:  September 03, 2023. FINDINGS: Seven intraoperative fluoroscopic images were obtained of the distal right femur. These demonstrate surgical internal fixation of distal right femoral fracture. IMPRESSION: Fluoroscopic guidance provided during surgical internal fixation of distal right femoral fracture. Electronically Signed   By: Lupita Raider M.D.   On: 09/04/2023 08:45   DG C-Arm 1-60 Min-No Report Result Date: 09/04/2023 Fluoroscopy was utilized by the requesting physician.  No radiographic interpretation.   DG Chest 2 View Result Date: 09/03/2023 CLINICAL DATA:  Preop chest exam.  Femur fracture. EXAM: CHEST - 2 VIEW COMPARISON:  04/23/2017 FINDINGS: The cardiomediastinal contours are normal. Chronic coarsened lung markings. Pulmonary vasculature is normal. No consolidation, pleural effusion, or pneumothorax. No acute osseous abnormalities are seen. The bones are diffusely under mineralized. IMPRESSION: No acute findings. Electronically Signed   By: Narda Rutherford M.D.   On: 09/03/2023 21:00   CT Knee Right Wo Contrast Result Date: 09/03/2023 CLINICAL DATA:  Comminuted fracture distal femur EXAM: CT OF THE RIGHT KNEE WITHOUT CONTRAST TECHNIQUE:  Multidetector CT imaging of the right knee was performed according to the standard protocol. Multiplanar CT image reconstructions were also generated. RADIATION DOSE REDUCTION: This exam was performed according to the departmental dose-optimization program which includes automated exposure control, adjustment of the mA and/or kV according to patient size and/or use of iterative reconstruction technique. COMPARISON:  Radiographs 09/03/2023 FINDINGS: Bones/Joint/Cartilage Bony demineralization. OTA 33 A3 extra-articular fracture of the distal femoral metaphysis with substantial comminution. Although components of the fracture extend close to the  articular surface, for example anterolaterally near the margin of the lateral femoral trochlear groove, and posteriorly towards the intercondylar notch, no definite extension to the weight-bearing surfaces or definite involvement of the trochlear articular surface is observed. Meniscal chondrocalcinosis compatible with CPPD arthropathy. Ligaments Suboptimally assessed by CT. Muscles and Tendons Edema tracks along fascia planes in the vicinity of the fractures, including along the vastus musculature Soft tissues Infiltrative edema in the popliteal space especially superiorly. IMPRESSION: 1. Comminuted extra-articular fracture of the distal femoral metaphysis with substantial comminution. Although components of the fracture extend close to the articular surface, no definite extension to the weight-bearing surfaces or definite involvement of the trochlear articular surface is observed. 2. Meniscal chondrocalcinosis compatible with CPPD arthropathy. 3. Bony demineralization. 4. Infiltrative edema in the popliteal space especially superiorly. Electronically Signed   By: Gaylyn Rong M.D.   On: 09/03/2023 13:18   DG Knee Complete 4 Views Right Result Date: 09/03/2023 CLINICAL DATA:  Fall.  Right knee pain and swelling. EXAM: RIGHT KNEE - COMPLETE 4+ VIEW COMPARISON:  None  Available. FINDINGS: There is acute, comminuted angulated fracture of the distal right femur. No discrete intra-articular extension seen. No other acute fracture or dislocation. No aggressive osseous lesion. There are degenerative changes of the knee joint in the form of mildly reduced tibio-femoral compartment joint space and osteophytosis. No knee effusion or focal soft tissue swelling. No radiopaque foreign bodies. IMPRESSION: *Acute comminuted fractures of the distal right femur. Electronically Signed   By: Jules Schick M.D.   On: 09/03/2023 12:13   CT Cervical Spine Wo Contrast Result Date: 09/03/2023 CLINICAL DATA:  78 year old female status post trip and fall this morning. Pain and swelling. EXAM: CT CERVICAL SPINE WITHOUT CONTRAST TECHNIQUE: Multidetector CT imaging of the cervical spine was performed without intravenous contrast. Multiplanar CT image reconstructions were also generated. RADIATION DOSE REDUCTION: This exam was performed according to the departmental dose-optimization program which includes automated exposure control, adjustment of the mA and/or kV according to patient size and/or use of iterative reconstruction technique. COMPARISON:  Head CT today.  CTA head and neck 12/27/2019. FINDINGS: Alignment: Chronic straightening of cervical lordosis. Cervicothoracic junction alignment is within normal limits. Bilateral posterior element alignment is within normal limits. Skull base and vertebrae: Stable bone mineralization since 2021. Visualized skull base is intact. No atlanto-occipital dissociation. C1 and C2 appear intact and aligned. No acute osseous abnormality identified. Soft tissues and spinal canal: No prevertebral fluid or swelling. No visible canal hematoma. Negative visible noncontrast neck soft tissues aside from calcified carotid atherosclerosis. Disc levels: Chronic cervical spine degeneration appears stable since 2021, fairly age-appropriate. Upper chest: Upper thoracic  vertebrae appear intact. Chronic apical lung scarring is stable. IMPRESSION: 1. No acute traumatic injury identified in the cervical spine. 2. Stable cervical spine degeneration since 2021. Electronically Signed   By: Odessa Fleming M.D.   On: 09/03/2023 11:46   CT Head Wo Contrast Result Date: 09/03/2023 CLINICAL DATA:  78 year old female status post trip and fall this morning. Pain and swelling. EXAM: CT HEAD WITHOUT CONTRAST TECHNIQUE: Contiguous axial images were obtained from the base of the skull through the vertex without intravenous contrast. RADIATION DOSE REDUCTION: This exam was performed according to the departmental dose-optimization program which includes automated exposure control, adjustment of the mA and/or kV according to patient size and/or use of iterative reconstruction technique. COMPARISON:  Head CT 12/27/2019. FINDINGS: Brain: Cerebral volume loss since 2021 appears to be generalized. No midline shift, ventriculomegaly, mass effect, evidence  of mass lesion, intracranial hemorrhage or evidence of cortically based acute infarction. Chronic but progressed, Patchy and confluent bilateral cerebral white matter hypodensity, moderate for age. Vascular: No suspicious intracranial vascular hyperdensity. Calcified atherosclerosis at the skull base. Skull: Stable, intact. Sinuses/Orbits: Visualized paranasal sinuses and mastoids are clear. Other: Visualized orbits and scalp soft tissues are within normal limits. IMPRESSION: 1. No acute intracranial abnormality or acute traumatic injury identified. 2. Generalized cerebral volume loss and progressed chronic white matter disease since 2021. Electronically Signed   By: Odessa Fleming M.D.   On: 09/03/2023 11:43    Microbiology: Results for orders placed or performed during the hospital encounter of 09/03/23  Surgical pcr screen     Status: Abnormal   Collection Time: 09/04/23 12:12 AM   Specimen: Nasal Mucosa; Nasal Swab  Result Value Ref Range Status   MRSA,  PCR NEGATIVE NEGATIVE Final   Staphylococcus aureus POSITIVE (A) NEGATIVE Final    Comment: (NOTE) The Xpert SA Assay (FDA approved for NASAL specimens in patients 48 years of age and older), is one component of a comprehensive surveillance program. It is not intended to diagnose infection nor to guide or monitor treatment. Performed at [facility], [address] [place], [place] [address]     Labs: CBC: Recent Labs  Lab 09/03/23 1201 09/04/23 0455 09/04/23 1101 09/05/23 0519 09/06/23 0446 09/07/23 0623 09/08/23 0647  WBC 12.4*   < > 10.7* 12.2* 9.1 8.4 7.4  NEUTROABS 10.3*  --   --   --   --   --   --   HGB 10.6*   < > 9.2* 7.3* 7.6* 7.7* 8.3*  HCT 32.1*   < > 27.8* 22.0* 23.0* 23.9* 25.5*  MCV 86.5   < > 88.0 87.3 87.5 87.9 88.9  PLT 250   < > 177 174 135* 162 137*   < > = values in this interval not displayed.   Basic Metabolic Panel: Recent Labs  Lab 09/03/23 1201 09/04/23 0455 09/04/23 1101 09/05/23 0519 09/07/23 0623  NA 134* 134*  --  138 137  K 3.7 3.4*  --  4.4 3.9  CL 101 101  --  104 104  CO2 22 24  --  23 26  GLUCOSE 136* 134*  --  145* 106*  BUN 32* 39*  --  25* 18  CREATININE 0.77 1.32* 1.16* 1.01* 0.77  CALCIUM 8.8* 8.5*  --  8.9 8.5*   Liver Function Tests: Recent Labs  Lab 09/04/23 0455  AST 16  ALT 14  ALKPHOS 54  BILITOT 0.6  PROT 5.4*  ALBUMIN 3.2*   CBG: No results for input(s): "GLUCAP" in the last 168 hours.  Discharge time spent: greater than 30 minutes.  Signed: Alba Cory, MD [facility] 09/09/2023

## 2023-09-09 NOTE — Progress Notes (Signed)
 Physical Therapy Treatment Patient Details Name: Denise Gonzales MRN: [id number] DOB: 1946/01/17 Today's Date: 09/09/2023   History of Present Illness Patient is 78 y.o. female presented 09/03/23 to [facility] after a fall resulting in Rt supracondylar distal femur fracture. Pt s/p Rt ORIF on 09/04/23 and is WBAT. PMH significant for asthma, GERD, PSVT, HTN, HLD, osteopenia.    PT Comments  Patient agreeable to participate with therapy.  Session began with a review of patient's HEP. Under supervision, patient able to transition to EOB with increased time, requiring cues for hand placement and shifting of hips. Patient able to transition from a sit > stand using a RW, and ambulated 35ft with a RW within the unit. Sit > stand transfer required Min A to assist push off from bed and CGA during gait training to ensure safety. Educated and deferred demonstrating a step-through pattern during ambulation due to decreased tolerance to activity. Patient will benefit from continued inpatient follow up therapy, <3 hours/day in order to maximize a functional level of independence.     If plan is discharge home, recommend the following: Two people to help with walking and/or transfers;A lot of help with bathing/dressing/bathroom;Assistance with cooking/housework;Direct supervision/assist for medications management;Assist for transportation;Help with stairs or ramp for entrance   Can travel by private vehicle     Yes  Equipment Recommendations  Rolling walker (2 wheels);BSC/3in1    Recommendations for Other Services       Precautions / Restrictions Precautions Precautions: Fall Recall of Precautions/Restrictions: Intact Restrictions Weight Bearing Restrictions Per Provider Order: Yes RLE Weight Bearing Per Provider Order: Weight bearing as tolerated     Mobility  Bed Mobility Overal bed mobility: Needs Assistance Bed Mobility: Supine to Sit     Supine to sit: Supervision     General bed mobility  comments: Increased time to sit EOB, cues provided to shift hips amnd move legs EOB    Transfers Overall transfer level: Needs assistance Equipment used: Rolling walker (2 wheels) Transfers: Sit to/from Stand Sit to Stand: Min assist, Contact guard assist           General transfer comment: cues for hand placement and min assist to power up. Patient was min to CGA for transfers due to pain when WB through RLE    Ambulation/Gait Ambulation/Gait assistance: Contact guard assist, +2 safety/equipment Gait Distance (Feet): 80 Feet Assistive device: Rolling walker (2 wheels) Gait Pattern/deviations: Step-to pattern, Trunk flexed, Decreased step length - left, Decreased stance time - right Gait velocity: Decreased Gait velocity interpretation: 1.31 - 2.62 ft/sec, indicative of limited community ambulator   General Gait Details: Cues to maintain upright posture when ambulating. Attempted and deferred  step-through pattern due to increased pain. Limted household ambulator due to increased pain and uneven distribution of weight through RLE, requried 2 seated rest breaks (ambulated 35ft initially, finshed off ambulating 52ft)   Stairs             Wheelchair Mobility     Tilt Bed    Modified Rankin (Stroke Patients Only)       Balance Overall balance assessment: Needs assistance Sitting-balance support: Bilateral upper extremity supported, Feet supported Sitting balance-Leahy Scale: Fair Sitting balance - Comments: EOB   Standing balance support: Bilateral upper extremity supported, During functional activity, Reliant on assistive device for balance Standing balance-Leahy Scale: Poor Standing balance comment: reliant on RW for support when standing  Communication Communication Communication: No apparent difficulties  Cognition Arousal: Alert Behavior During Therapy: WFL for tasks assessed/performed                              Following commands: Intact      Cueing Cueing Techniques: Verbal cues, Tactile cues  Exercises Total Joint Exercises Ankle Circles/Pumps: AROM, Both, 10 reps, Supine Quad Sets: AROM, Right, 10 reps, Supine Heel Slides: AAROM, Right, 10 reps, Seated Hip ABduction/ADduction: AAROM, Right, 10 reps, Supine General Exercises - Lower Extremity Short Arc Quad: AROM, Right, 10 reps, Seated    General Comments        Pertinent Vitals/Pain Pain Assessment Pain Assessment: Faces Faces Pain Scale: Hurts little more Pain Location: Rt knee and thigh Pain Descriptors / Indicators: Aching, Discomfort, Guarding, Operative site guarding, Sore, Tightness Pain Intervention(s): Limited activity within patient's tolerance, Monitored during session, Patient requesting pain meds-RN notified, Ice applied    Home Living                          Prior Function            PT Goals (current goals can now be found in the care plan section) Acute Rehab PT Goals Patient Stated Goal: regain mobility, stop hurting PT Goal Formulation: With patient Time For Goal Achievement: 09/18/23 Potential to Achieve Goals: Good Progress towards PT goals: Progressing toward goals    Frequency    Min 1X/week      PT Plan      Co-evaluation              AM-PAC PT "6 Clicks" Mobility   Outcome Measure  Help needed turning from your back to your side while in a flat bed without using bedrails?: A Little Help needed moving from lying on your back to sitting on the side of a flat bed without using bedrails?: A Little Help needed moving to and from a bed to a chair (including a wheelchair)?: A Little Help needed standing up from a chair using your arms (e.g., wheelchair or bedside chair)?: A Little Help needed to walk in hospital room?: A Little Help needed climbing 3-5 steps with a railing? : A Lot 6 Click Score: 17    End of Session Equipment Utilized During Treatment: Gait  belt Activity Tolerance: Patient limited by pain Patient left: in chair;with nursing/sitter in room;with chair alarm set;with call bell/phone within reach Nurse Communication: Patient requests pain meds;Mobility status PT Visit Diagnosis: Unsteadiness on feet (R26.81);Other abnormalities of gait and mobility (R26.89);Muscle weakness (generalized) (M62.81);Difficulty in walking, not elsewhere classified (R26.2);Pain Pain - Right/Left: Right Pain - part of body: Knee     Time: 3086-5784 PT Time Calculation (min) (ACUTE ONLY): 37 min  Charges:    $Gait Training: 8-22 mins $Therapeutic Exercise: 8-22 mins PT General Charges $$ ACUTE PT VISIT: 1 Visit                     Doreen Beam, SPT   Johnathyn Viscomi 09/09/2023, 2:13 PM

## 2023-09-09 NOTE — Plan of Care (Signed)
 Pt is alert and oriented x 4. Up 1 assist with walker to bedside commode. Norco 2 tabs given 2 x this shift for pain. Scd placed. Educated on scd and incentive spirometer. 750 achieved on incentive spirometer. Foam dsg changed to right hip. Ice applied during shift at beginning due to severe pain. Pt educated on asking for pain medication.  Problem: Education: Goal: Knowledge of General Education information will improve Description: Including pain rating scale, medication(s)/side effects and non-pharmacologic comfort measures Outcome: Progressing   Problem: Health Behavior/Discharge Planning: Goal: Ability to manage health-related needs will improve Outcome: Progressing   Problem: Clinical Measurements: Goal: Ability to maintain clinical measurements within normal limits will improve Outcome: Progressing Goal: Will remain free from infection Outcome: Progressing Goal: Diagnostic test results will improve Outcome: Progressing Goal: Respiratory complications will improve Outcome: Progressing Goal: Cardiovascular complication will be avoided Outcome: Progressing   Problem: Activity: Goal: Risk for activity intolerance will decrease Outcome: Progressing   Problem: Nutrition: Goal: Adequate nutrition will be maintained Outcome: Progressing   Problem: Coping: Goal: Level of anxiety will decrease Outcome: Progressing   Problem: Elimination: Goal: Will not experience complications related to bowel motility Outcome: Progressing Goal: Will not experience complications related to urinary retention Outcome: Progressing   Problem: Pain Managment: Goal: General experience of comfort will improve and/or be controlled Outcome: Progressing   Problem: Safety: Goal: Ability to remain free from injury will improve Outcome: Progressing   Problem: Skin Integrity: Goal: Risk for impaired skin integrity will decrease Outcome: Progressing   Problem: Education: Goal: Verbalization of  understanding the information provided (i.e., activity precautions, restrictions, etc) will improve Outcome: Progressing Goal: Individualized Educational Video(s) Outcome: Progressing   Problem: Activity: Goal: Ability to ambulate and perform ADLs will improve Outcome: Progressing   Problem: Clinical Measurements: Goal: Postoperative complications will be avoided or minimized Outcome: Progressing   Problem: Self-Concept: Goal: Ability to maintain and perform role responsibilities to the fullest extent possible will improve Outcome: Progressing   Problem: Pain Management: Goal: Pain level will decrease Outcome: Progressing

## 2023-09-09 NOTE — TOC Progression Note (Addendum)
 Transition of Care [facility]) - Progression Note    Patient Details  Name: Denise Gonzales MRN: [id number] Date of Birth: Nov 12, 1945  [facility] CM/SW Contact  Lorri Frederick, LCSW Phone Number: 09/09/2023, 2:30 PM  Clinical Narrative:   CSW spoke with daughter Lurena Joiner.  She does want to accept offer at [facility].  CSW confirmed with [facility] that they can receive pt today.  1212: TC [facility].  Needs new PT note for auth request.  PT notified.  1430: PT note is in, CSW LM with [facility]  Message from [facility]: wants to plan on DC tomorrow.  1445: SNF auth approved, 7 days: [id number].  PTAR not approved.  Expected Discharge Plan: Skilled Nursing Facility Barriers to Discharge: Continued Medical Work up, SNF Pending bed offer  Expected Discharge Plan and Services In-house Referral: Clinical Social Work   Post Acute Care Choice: Skilled Nursing Facility Living arrangements for the past 2 months: Single Family Home Expected Discharge Date: 09/08/23                                     Social Determinants of Health (SDOH) Interventions SDOH Screenings   Food Insecurity: No Food Insecurity (09/03/2023)  Housing: Low Risk  (09/03/2023)  Transportation Needs: No Transportation Needs (09/03/2023)  Utilities: Not At Risk (09/03/2023)  Social Connections: Socially Integrated (09/03/2023)  Tobacco Use: Low Risk  (09/04/2023)    Readmission Risk Interventions     No data to display

## 2023-09-10 DIAGNOSIS — I10 Essential (primary) hypertension: Secondary | ICD-10-CM | POA: Diagnosis not present

## 2023-09-10 DIAGNOSIS — S72401P Unspecified fracture of lower end of right femur, subsequent encounter for closed fracture with malunion: Secondary | ICD-10-CM | POA: Diagnosis not present

## 2023-09-10 DIAGNOSIS — W19XXXA Unspecified fall, initial encounter: Secondary | ICD-10-CM | POA: Diagnosis not present

## 2023-09-10 DIAGNOSIS — Y92009 Unspecified place in unspecified non-institutional (private) residence as the place of occurrence of the external cause: Secondary | ICD-10-CM

## 2023-09-10 NOTE — Progress Notes (Signed)
 Report given to [facility] RN from [facility]. All questions answered.

## 2023-09-10 NOTE — Discharge Summary (Signed)
 Physician Discharge Summary  Denise Gonzales QMV:[id number] DOB: 02-18-1946 DOA: 09/03/2023  PCP: Pcp, No  Admit date: 09/03/2023 Discharge date: 09/10/2023  Time spent: 60 minutes  Recommendations for Outpatient Follow-up:    Needs to follow up with Dr Denise Gonzales in 2 weeks for wound check and X ray.  Weightbearing: WBAT RLE ROM: Okay for knee ROM as tolerated Incisional and dressing care: dressing changes as needed  Needs CBC to follow hb. Needs evaluation of iron deficiency.     Discharge Diagnoses:  Principal Problem:   Closed fracture of right distal femur (HCC) Active Problems:   Other fracture of right femur, initial encounter for closed fracture (HCC)   HTN (hypertension)   Fall at home, initial encounter   Discharge Condition: Stable and improved.  Diet recommendation: heart healthy diet.  Filed Weights   09/07/23 0500 09/09/23 0343 09/10/23 0500  Weight: 59.1 kg 57.5 kg 58 kg    History of present illness:  HPI per Dr. Lilian Gonzales is a 78 y.o. female with past medical history  of allergies to [facility], hypertension, GERD, hyperlipidemia, osteopenia  Brought from [facility] for fall.  Patient tripped landed on her right right side in the kitchen today,and since then unable to move and extend leg. Fall was about an hour or so before her coming to ED.  NO LOC or AC or AP or aspirin.     >>ED Course:Direct admit.  At bedside patient is alert awake oriented. Multiple Vitals        Vitals:    09/03/23 1330 09/03/23 1400 09/03/23 1558 09/03/23 1622  BP: 138/65 (!) 129/56 (!) 127/59    Pulse: 64 69 78    Temp:     98.6 F (37 C)    Resp: 15 16 18     Height:       5' 2.99" (1.6 m)  Weight:       54.7 kg  SpO2: 100% 99% 98%    TempSrc:     Oral    BMI (Calculated):       21.37      ED evaluation  so far shows: Basic metabolic panel showing glucose 136 sodium 134 normal kidney function. CBC shows leukocytosis of 12.4 hemoglobin of 10.6 and normal  platelet count. CT knee shows fracture of femur: 1. Comminuted extra-articular fracture of the distal femoral metaphysis with substantial comminution. Although components of the fracture extend close to the articular surface, no definite extension to the weight-bearing surfaces or definite involvement of the trochlear articular surface is observed. 2. Meniscal chondrocalcinosis compatible with CPPD arthropathy. 3. Bony demineralization. 4. Infiltrative edema in the popliteal space especially superiorly.   In the emergency room  pt has received the following treatment thus far:  Hospital Course:  -For hospital course please see discharge summary dictated by Dr Denise Gonzales on 09/09/2023.  Procedures: ORIF right distal femur fracture: Per Dr. Jena Gonzales orthopedics 09/04/2023 CT head 09/03/2023 CT C-spine 09/03/2023 CT right knee 09/03/2023 Chest x-ray 09/03/2023 Plain films of the right femur 09/04/2023 Plain films of the right knee 09/04/2023  Consultations: Orthopedics: Dr. Dion Gonzales 09/03/2023 Orthopedics: Dr. Jena Gonzales 09/04/2023  Discharge Exam: Vitals:   09/10/23 0822 09/10/23 0926  BP: (!) 164/64   Pulse:    Resp:  17  Temp:    SpO2:      General: NAD Cardiovascular: RRR.  No JVD.  No lower extremity edema. Respiratory: Clear to auscultation bilaterally.  No wheezes, no crackles, no rhonchi.  Fair air movement.  Speaking in full sentences.  Discharge Instructions   Discharge Instructions     Diet - low sodium heart healthy   Complete by: As directed    Discharge wound care:   Complete by: As directed    See above   Increase activity slowly   Complete by: As directed    Increase activity slowly   Complete by: As directed    No wound care   Complete by: As directed       Allergies as of 09/10/2023       Reactions   Bactrim [sulfamethoxazole-trimethoprim] Other (See Comments)   Pt felt like she was having a heart attack.         Medication List     STOP taking these  medications    lidocaine 5 % Commonly known as: Lidoderm   OVER THE COUNTER MEDICATION       TAKE these medications    acetaminophen 325 MG tablet Commonly known as: TYLENOL Take 1-2 tablets (325-650 mg total) by mouth every 6 (six) hours as needed for mild pain (pain score 1-3) (or temp > 100.5).   albuterol 108 (90 Base) MCG/ACT inhaler Commonly known as: ProAir HFA Inhale 2 puffs into the lungs every 4 (four) hours as needed.   apixaban 2.5 MG Tabs tablet Commonly known as: Eliquis Take 1 tablet (2.5 mg total) by mouth 2 (two) times daily.   atorvastatin 20 MG tablet Commonly known as: LIPITOR Take 1 tablet by mouth daily.   b complex vitamins tablet Take 1 tablet by mouth daily.   CENTRUM SILVER PO Take 1 tablet by mouth daily.   chlorhexidine 4 % external liquid Commonly known as: HIBICLENS Apply 15 mLs (1 Application total) topically as directed for 30 doses. Use as directed daily for 5 days every other week for 6 weeks.   diltiazem 240 MG 24 hr capsule Commonly known as: CARDIZEM CD Take 240 mg by mouth daily.   docusate sodium 100 MG capsule Commonly known as: COLACE Take 1 capsule (100 mg total) by mouth 2 (two) times daily.   FISH OIL PO Take 1 tablet by mouth daily.   fluticasone 50 MCG/ACT nasal spray Commonly known as: FLONASE Place 2 sprays into both nostrils daily as needed for allergies or rhinitis.   fluticasone-salmeterol 100-50 MCG/ACT Aepb Commonly known as: ADVAIR INHALE ONE PUFF BY MOUTH TWICE A DAY What changed:  how much to take how to take this when to take this   folic acid 1 MG tablet Commonly known as: FOLVITE Take 1 tablet (1 mg total) by mouth daily.   hydrochlorothiazide 12.5 MG tablet Commonly known as: HYDRODIURIL Take 12.5 mg by mouth daily.   HYDROcodone-acetaminophen 7.5-325 MG tablet Commonly known as: NORCO Take 1 tablet by mouth every 4 (four) hours as needed for severe pain (pain score 7-10).   iron  polysaccharides 150 MG capsule Commonly known as: NIFEREX Take 1 capsule (150 mg total) by mouth daily.   lisinopril 40 MG tablet Commonly known as: ZESTRIL Take 40 mg by mouth daily.   loratadine 10 MG tablet Commonly known as: CLARITIN Take 10 mg by mouth daily.   methocarbamol 500 MG tablet Commonly known as: ROBAXIN Take 1 tablet (500 mg total) by mouth every 8 (eight) hours as needed for muscle spasms.   montelukast 10 MG tablet Commonly known as: SINGULAIR Take 1 tablet (10 mg total) by mouth daily.   mupirocin ointment 2 % Commonly known  as: BACTROBAN Place 1 Application into the nose 2 (two) times daily for 60 doses. Use as directed 2 times daily for 5 days every other week for 6 weeks.   polyethylene glycol 17 g packet Commonly known as: MIRALAX / GLYCOLAX Take 17 g by mouth daily.   senna 8.6 MG Tabs tablet Commonly known as: SENOKOT Take 1 tablet (8.6 mg total) by mouth 2 (two) times daily.   Vitamin D (Ergocalciferol) 1.25 MG (50000 UNIT) Caps capsule Commonly known as: DRISDOL Take 50,000 Units by mouth once a week.               Discharge Care Instructions  (From admission, onward)           Start     Ordered   09/08/23 0000  Discharge wound care:       Comments: See above   09/08/23 1039           Allergies  Allergen Reactions   Bactrim [Sulfamethoxazole-Trimethoprim] Other (See Comments)    Pt felt like she was having a heart attack.     Contact information for follow-up providers     Haddix, Gillie Manners, MD. Schedule an appointment as soon as possible for a visit in 2 week(s).   Specialty: Orthopedic Surgery Why: for wound check and repeat x-rays Contact information: [address] [place] [address] [phone]              Contact information for after-discharge care     Destination     [facility], [facility] Preferred SNF .   Service: Skilled Nursing Contact information: [address] [place] [address] [phone]                      The results of significant diagnostics from this hospitalization (including imaging, microbiology, ancillary and laboratory) are listed below for reference.    Significant Diagnostic Studies: DG Knee Right Port Result Date: 09/04/2023 CLINICAL DATA:  Distal right femoral fracture. EXAM: PORTABLE RIGHT KNEE - 1-2 VIEW COMPARISON:  Right knee radiographs 09/03/2023, CT right knee 09/03/2023 FINDINGS: There is diffuse decreased bone mineralization. Interval long lateral plate and screw fixation of the distal femur including the patient seen markedly comminuted acute distal femoral diaphyseal through lateral femoral condyle fracture. There is approximately 5 mm medial displacement of a dominant medial cortical butterfly fracture fragment, similar to prior. Improvement in multiple lateral distal femoral metadiaphyseal cortical fracture fragment alignment. Mild posterior displacement of a portion of a distal posterior femoral metadiaphyseal cortical fracture fragment. No evidence of hardware failure. IMPRESSION: Interval long lateral plate and screw fixation of the distal femur including the patient seen markedly comminuted acute distal femoral diaphyseal through lateral femoral condyle fracture. Electronically Signed   By: Neita Garnet M.D.   On: 09/04/2023 10:50   DG FEMUR, MIN 2 VIEWS RIGHT Result Date: 09/04/2023 CLINICAL DATA:  Open reduction and internal fixation of distal right femoral fracture. EXAM: RIGHT FEMUR 2 VIEWS; DG C-ARM 1-60 MIN-NO REPORT Radiation exposure index: 2.52 mGy. COMPARISON:  September 03, 2023. FINDINGS: Seven intraoperative fluoroscopic images were obtained of the distal right femur. These demonstrate surgical internal fixation of distal right femoral fracture. IMPRESSION: Fluoroscopic guidance provided during surgical internal fixation of distal right femoral fracture. Electronically Signed   By:  Lupita Raider M.D.   On: 09/04/2023 08:45   DG C-Arm 1-60 Min-No Report Result Date: 09/04/2023 Fluoroscopy was utilized by the requesting  physician.  No radiographic interpretation.   DG Chest 2 View Result Date: 09/03/2023 CLINICAL DATA:  Preop chest exam.  Femur fracture. EXAM: CHEST - 2 VIEW COMPARISON:  04/23/2017 FINDINGS: The cardiomediastinal contours are normal. Chronic coarsened lung markings. Pulmonary vasculature is normal. No consolidation, pleural effusion, or pneumothorax. No acute osseous abnormalities are seen. The bones are diffusely under mineralized. IMPRESSION: No acute findings. Electronically Signed   By: Narda Rutherford M.D.   On: 09/03/2023 21:00   CT Knee Right Wo Contrast Result Date: 09/03/2023 CLINICAL DATA:  Comminuted fracture distal femur EXAM: CT OF THE RIGHT KNEE WITHOUT CONTRAST TECHNIQUE: Multidetector CT imaging of the right knee was performed according to the standard protocol. Multiplanar CT image reconstructions were also generated. RADIATION DOSE REDUCTION: This exam was performed according to the departmental dose-optimization program which includes automated exposure control, adjustment of the mA and/or kV according to patient size and/or use of iterative reconstruction technique. COMPARISON:  Radiographs 09/03/2023 FINDINGS: Bones/Joint/Cartilage Bony demineralization. OTA 33 A3 extra-articular fracture of the distal femoral metaphysis with substantial comminution. Although components of the fracture extend close to the articular surface, for example anterolaterally near the margin of the lateral femoral trochlear groove, and posteriorly towards the intercondylar notch, no definite extension to the weight-bearing surfaces or definite involvement of the trochlear articular surface is observed. Meniscal chondrocalcinosis compatible with CPPD arthropathy. Ligaments Suboptimally assessed by CT. Muscles and Tendons Edema tracks along fascia planes in the vicinity of  the fractures, including along the vastus musculature Soft tissues Infiltrative edema in the popliteal space especially superiorly. IMPRESSION: 1. Comminuted extra-articular fracture of the distal femoral metaphysis with substantial comminution. Although components of the fracture extend close to the articular surface, no definite extension to the weight-bearing surfaces or definite involvement of the trochlear articular surface is observed. 2. Meniscal chondrocalcinosis compatible with CPPD arthropathy. 3. Bony demineralization. 4. Infiltrative edema in the popliteal space especially superiorly. Electronically Signed   By: Gaylyn Rong M.D.   On: 09/03/2023 13:18   DG Knee Complete 4 Views Right Result Date: 09/03/2023 CLINICAL DATA:  Fall.  Right knee pain and swelling. EXAM: RIGHT KNEE - COMPLETE 4+ VIEW COMPARISON:  None Available. FINDINGS: There is acute, comminuted angulated fracture of the distal right femur. No discrete intra-articular extension seen. No other acute fracture or dislocation. No aggressive osseous lesion. There are degenerative changes of the knee joint in the form of mildly reduced tibio-femoral compartment joint space and osteophytosis. No knee effusion or focal soft tissue swelling. No radiopaque foreign bodies. IMPRESSION: *Acute comminuted fractures of the distal right femur. Electronically Signed   By: Jules Schick M.D.   On: 09/03/2023 12:13   CT Cervical Spine Wo Contrast Result Date: 09/03/2023 CLINICAL DATA:  78 year old female status post trip and fall this morning. Pain and swelling. EXAM: CT CERVICAL SPINE WITHOUT CONTRAST TECHNIQUE: Multidetector CT imaging of the cervical spine was performed without intravenous contrast. Multiplanar CT image reconstructions were also generated. RADIATION DOSE REDUCTION: This exam was performed according to the departmental dose-optimization program which includes automated exposure control, adjustment of the mA and/or kV according  to patient size and/or use of iterative reconstruction technique. COMPARISON:  Head CT today.  CTA head and neck 12/27/2019. FINDINGS: Alignment: Chronic straightening of cervical lordosis. Cervicothoracic junction alignment is within normal limits. Bilateral posterior element alignment is within normal limits. Skull base and vertebrae: Stable bone mineralization since 2021. Visualized skull base is intact. No atlanto-occipital dissociation. C1 and C2 appear intact and  aligned. No acute osseous abnormality identified. Soft tissues and spinal canal: No prevertebral fluid or swelling. No visible canal hematoma. Negative visible noncontrast neck soft tissues aside from calcified carotid atherosclerosis. Disc levels: Chronic cervical spine degeneration appears stable since 2021, fairly age-appropriate. Upper chest: Upper thoracic vertebrae appear intact. Chronic apical lung scarring is stable. IMPRESSION: 1. No acute traumatic injury identified in the cervical spine. 2. Stable cervical spine degeneration since 2021. Electronically Signed   By: Odessa Fleming M.D.   On: 09/03/2023 11:46   CT Head Wo Contrast Result Date: 09/03/2023 CLINICAL DATA:  78 year old female status post trip and fall this morning. Pain and swelling. EXAM: CT HEAD WITHOUT CONTRAST TECHNIQUE: Contiguous axial images were obtained from the base of the skull through the vertex without intravenous contrast. RADIATION DOSE REDUCTION: This exam was performed according to the departmental dose-optimization program which includes automated exposure control, adjustment of the mA and/or kV according to patient size and/or use of iterative reconstruction technique. COMPARISON:  Head CT 12/27/2019. FINDINGS: Brain: Cerebral volume loss since 2021 appears to be generalized. No midline shift, ventriculomegaly, mass effect, evidence of mass lesion, intracranial hemorrhage or evidence of cortically based acute infarction. Chronic but progressed, Patchy and confluent  bilateral cerebral white matter hypodensity, moderate for age. Vascular: No suspicious intracranial vascular hyperdensity. Calcified atherosclerosis at the skull base. Skull: Stable, intact. Sinuses/Orbits: Visualized paranasal sinuses and mastoids are clear. Other: Visualized orbits and scalp soft tissues are within normal limits. IMPRESSION: 1. No acute intracranial abnormality or acute traumatic injury identified. 2. Generalized cerebral volume loss and progressed chronic white matter disease since 2021. Electronically Signed   By: Odessa Fleming M.D.   On: 09/03/2023 11:43    Microbiology: Recent Results (from the past 240 hours)  Surgical pcr screen     Status: Abnormal   Collection Time: 09/04/23 12:12 AM   Specimen: Nasal Mucosa; Nasal Swab  Result Value Ref Range Status   MRSA, PCR NEGATIVE NEGATIVE Final   Staphylococcus aureus POSITIVE (A) NEGATIVE Final    Comment: (NOTE) The Xpert SA Assay (FDA approved for NASAL specimens in patients 34 years of age and older), is one component of a comprehensive surveillance program. It is not intended to diagnose infection nor to guide or monitor treatment. Performed at [facility], [address] [place], [place] [address]      Labs: Basic Metabolic Panel: Recent Labs  Lab 09/03/23 1201 09/04/23 0455 09/04/23 1101 09/05/23 0519 09/07/23 0623  NA 134* 134*  --  138 137  K 3.7 3.4*  --  4.4 3.9  CL 101 101  --  104 104  CO2 22 24  --  23 26  GLUCOSE 136* 134*  --  145* 106*  BUN 32* 39*  --  25* 18  CREATININE 0.77 1.32* 1.16* 1.01* 0.77  CALCIUM 8.8* 8.5*  --  8.9 8.5*   Liver Function Tests: Recent Labs  Lab 09/04/23 0455  AST 16  ALT 14  ALKPHOS 54  BILITOT 0.6  PROT 5.4*  ALBUMIN 3.2*   No results for input(s): "LIPASE", "AMYLASE" in the last 168 hours. No results for input(s): "AMMONIA" in the last 168 hours. CBC: Recent Labs  Lab 09/03/23 1201 09/04/23 0455 09/04/23 1101 09/05/23 0519 09/06/23 0446  09/07/23 0623 09/08/23 0647  WBC 12.4*   < > 10.7* 12.2* 9.1 8.4 7.4  NEUTROABS 10.3*  --   --   --   --   --   --  HGB 10.6*   < > 9.2* 7.3* 7.6* 7.7* 8.3*  HCT 32.1*   < > 27.8* 22.0* 23.0* 23.9* 25.5*  MCV 86.5   < > 88.0 87.3 87.5 87.9 88.9  PLT 250   < > 177 174 135* 162 137*   < > = values in this interval not displayed.   Cardiac Enzymes: No results for input(s): "CKTOTAL", "CKMB", "CKMBINDEX", "TROPONINI" in the last 168 hours. BNP: BNP (last 3 results) No results for input(s): "BNP" in the last 8760 hours.  ProBNP (last 3 results) No results for input(s): "PROBNP" in the last 8760 hours.  CBG: No results for input(s): "GLUCAP" in the last 168 hours.     Signed:  Ramiro Harvest MD.  [facility] 09/10/2023, 10:58 AM

## 2023-09-10 NOTE — TOC Transition Note (Signed)
 Transition of Care [facility]) - Discharge Note   Patient Details  Name: Denise Gonzales MRN: [id number] Date of Birth: 1946/07/15  Transition of Care [facility]) CM/SW Contact:  Lorri Frederick, LCSW Phone Number: 09/10/2023, 11:19 AM   Clinical Narrative:   Pt discharging to [facility].  RN call report to [phone].       Final next level of care: Skilled Nursing Facility Barriers to Discharge: Barriers Resolved   Patient Goals and CMS Choice Patient states their goals for this hospitalization and ongoing recovery are:: back to normal CMS Medicare.gov Compare Post Acute Care list provided to:: Patient Choice offered to / list presented to : Patient      Discharge Placement              Patient chooses bed at: [facility] Patient to be transferred to facility by: [facility] Name of family member notified: daughter Terrence Dupont both in room Patient and family notified of of transfer: 09/10/23  Discharge Plan and Services Additional resources added to the After Visit Summary for   In-house Referral: Clinical Social Work   Post Acute Care Choice: Skilled Nursing Facility                               Social Drivers of Health (SDOH) Interventions SDOH Screenings   Food Insecurity: No Food Insecurity (09/03/2023)  Housing: Low Risk  (09/03/2023)  Transportation Needs: No Transportation Needs (09/03/2023)  Utilities: Not At Risk (09/03/2023)  Social Connections: Socially Integrated (09/03/2023)  Tobacco Use: Low Risk  (09/04/2023)     Readmission Risk Interventions     No data to display

## 2023-09-10 NOTE — TOC Progression Note (Signed)
 Transition of Care [facility]) - Progression Note    Patient Details  Name: Denise Gonzales MRN: [id number] Date of Birth: 1946/05/26  Transition of [facility]) CM/SW Contact  Lorri Frederick, LCSW Phone Number: 09/10/2023, 10:04 AM  Clinical Narrative:   CSW confirmed with [facility] that they can receive pt today.  MD informed.     Expected Discharge Plan: Skilled Nursing Facility Barriers to Discharge: Continued Medical Work up, SNF Pending bed offer  Expected Discharge Plan and Services In-house Referral: Clinical Social Work   Post Acute Care Choice: Skilled Nursing Facility Living arrangements for the past 2 months: Single Family Home Expected Discharge Date: 09/08/23                                     Social Determinants of Health (SDOH) Interventions SDOH Screenings   Food Insecurity: No Food Insecurity (09/03/2023)  Housing: Low Risk  (09/03/2023)  Transportation Needs: No Transportation Needs (09/03/2023)  Utilities: Not At Risk (09/03/2023)  Social Connections: Socially Integrated (09/03/2023)  Tobacco Use: Low Risk  (09/04/2023)    Readmission Risk Interventions     No data to display

## 2024-04-07 ENCOUNTER — Encounter: Admitting: Obstetrics and Gynecology

## 2024-04-07 ENCOUNTER — Ambulatory Visit: Admitting: Obstetrics and Gynecology

## 2024-04-22 ENCOUNTER — Ambulatory Visit: Admitting: Adult Health

## 2024-04-22 ENCOUNTER — Encounter: Payer: Self-pay | Admitting: Adult Health

## 2024-04-22 VITALS — BP 136/66 | HR 83 | Ht 62.0 in | Wt 114.8 lb

## 2024-04-22 DIAGNOSIS — J4530 Mild persistent asthma, uncomplicated: Secondary | ICD-10-CM

## 2024-04-22 DIAGNOSIS — J309 Allergic rhinitis, unspecified: Secondary | ICD-10-CM

## 2024-04-22 MED ORDER — ALBUTEROL SULFATE HFA 108 (90 BASE) MCG/ACT IN AERS: 1.0000 | INHALATION_SPRAY | Freq: Four times a day (QID) | RESPIRATORY_TRACT | 3 refills | Status: AC | PRN

## 2024-04-22 MED ORDER — FLUTICASONE PROPIONATE 50 MCG/ACT NA SUSP: 2.0000 | Freq: Every day | NASAL | 11 refills | Status: AC | PRN

## 2024-04-22 MED ORDER — MONTELUKAST SODIUM 10 MG PO TABS: 10.0000 mg | ORAL_TABLET | Freq: Every day | ORAL | 11 refills | Status: AC

## 2024-04-22 NOTE — Patient Instructions (Addendum)
 May remain off Advair .  Continue on Singulair  daily  Claritin  10 daily As needed   Flonase  2 puffs daily As needed   Activity as tolerated.  Albuterol  Inhaler As needed  Wheezing .  Follow up with Dr. Jude  or May Manrique NP in 1 year and As needed  with PFT

## 2024-04-22 NOTE — Progress Notes (Signed)
 @Patient  ID: Denise Gonzales, female    DOB: [birth year], 78 y.o.   MRN: [id number]  Chief Complaint  Patient presents with   Follow-up    Asthma     Referring provider: No ref. provider found  HPI: 78 yo female never smoker followed for asthma and allergic rhinitis Patient is [place] and is bilingual    TEST/EVENTS :  02/2014 Spirometry again showed moderate airway obstruction with FEV1 of 1.39-64%   01/2015 FEV1 63%   11/2016 FEV1 64%, ratio 70   12/2017 FEV1 64%   Discussed the use of AI scribe software for clinical note transcription with the patient, who gave verbal consent to proceed.  History of Present Illness Denise Gonzales is a 78 year old female with asthma who presents for a one-year follow-up for asthma and allergies.   She has not been using her inhalers since earlier this year following a fall that resulted in a broken leg. The incident occurred in the kitchen when she turned and fell, causing her leg to go under her. Has taken her a long time to recover, using a cane now.   Regarding her asthma, she has not been experiencing significant symptoms such as cough or wheezing since stopping Advair , which she used to take twice daily. She uses albuterol  as needed  She is taking Singulair  (montelukast ) for chronic allergies,. Uses Claritin  and Flonase  As needed  . No flare in allergy symptoms currently.  She lives at home with her husband and grandson, She has already received her flu shot for the year.  Tried to get simple spirometry today in office, unable to complete.     Allergies  Allergen Reactions   Bactrim [Sulfamethoxazole-Trimethoprim] Other (See Comments)    Pt felt like she was having a heart attack.     Immunization History  Administered Date(s) Administered   Influenza Split 04/14/2013   Influenza Whole 04/26/2019   Tdap 05/21/2021    Past Medical History:  Diagnosis Date   Asthma    DJD (degenerative joint disease)    GERD  (gastroesophageal reflux disease)    History of PSVT (paroxysmal supraventricular tachycardia)    Hyperlipidemia    Hypertension    Osteopenia     Tobacco History: Social History   Tobacco Use  Smoking Status Never  Smokeless Tobacco Never   Counseling given: Not Answered   Outpatient Medications Prior to Visit  Medication Sig Dispense Refill   acetaminophen  (TYLENOL ) 325 MG tablet Take 1-2 tablets (325-650 mg total) by mouth every 6 (six) hours as needed for mild pain (pain score 1-3) (or temp > 100.5). 30 tablet 0   albuterol  (PROAIR  HFA) 108 (90 Base) MCG/ACT inhaler Inhale 2 puffs into the lungs every 4 (four) hours as needed. 18 each 1   apixaban  (ELIQUIS ) 2.5 MG TABS tablet Take 1 tablet (2.5 mg total) by mouth 2 (two) times daily. 60 tablet 0   atorvastatin (LIPITOR) 20 MG tablet Take 1 tablet by mouth daily.     b complex vitamins tablet Take 1 tablet by mouth daily.     chlorhexidine  (HIBICLENS ) 4 % external liquid Apply 15 mLs (1 Application total) topically as directed for 30 doses. Use as directed daily for 5 days every other week for 6 weeks. 946 mL 1   diltiazem  (CARDIZEM  CD) 240 MG 24 hr capsule Take 240 mg by mouth daily.     docusate sodium  (COLACE) 100 MG capsule Take 1 capsule (100 mg total)  by mouth 2 (two) times daily. 10 capsule 0   fluticasone  (FLONASE ) 50 MCG/ACT nasal spray Place 2 sprays into both nostrils daily as needed for allergies or rhinitis. 1 g 5   fluticasone -salmeterol (ADVAIR ) 100-50 MCG/ACT AEPB INHALE ONE PUFF BY MOUTH TWICE A DAY 60 each 0   folic acid  (FOLVITE ) 1 MG tablet Take 1 tablet (1 mg total) by mouth daily. 30 tablet 0   hydrochlorothiazide  (HYDRODIURIL ) 12.5 MG tablet Take 12.5 mg by mouth daily.     iron  polysaccharides (NIFEREX) 150 MG capsule Take 1 capsule (150 mg total) by mouth daily. 30 capsule 0   lisinopril  (PRINIVIL ,ZESTRIL ) 40 MG tablet Take 40 mg by mouth daily.     loratadine  (CLARITIN ) 10 MG tablet Take 10 mg by mouth  daily.     methocarbamol  (ROBAXIN ) 500 MG tablet Take 1 tablet (500 mg total) by mouth every 8 (eight) hours as needed for muscle spasms. 20 tablet 0   Multiple Vitamins-Minerals (CENTRUM SILVER PO) Take 1 tablet by mouth daily.     Omega-3 Fatty Acids (FISH OIL PO) Take 1 tablet by mouth daily.     polyethylene glycol (MIRALAX  / GLYCOLAX ) 17 g packet Take 17 g by mouth daily. 14 each 0   senna (SENOKOT) 8.6 MG TABS tablet Take 1 tablet (8.6 mg total) by mouth 2 (two) times daily. 120 tablet 0   Vitamin D , Ergocalciferol , (DRISDOL) 1.25 MG (50000 UNIT) CAPS capsule Take 50,000 Units by mouth once a week.     HYDROcodone -acetaminophen  (NORCO) 7.5-325 MG tablet Take 1 tablet by mouth every 4 (four) hours as needed for severe pain (pain score 7-10). (Patient not taking: Reported on 04/22/2024) 42 tablet 0   montelukast  (SINGULAIR ) 10 MG tablet Take 1 tablet (10 mg total) by mouth daily. (Patient not taking: Reported on 04/22/2024) 30 tablet 11   No facility-administered medications prior to visit.     Review of Systems:   Constitutional:   No  weight loss, night sweats,  Fevers, chills, fatigue, or  lassitude.  HEENT:   No headaches,  Difficulty swallowing,  Tooth/dental problems, or  Sore throat,                No sneezing, itching, ear ache, nasal congestion, post nasal drip,   CV:  No chest pain,  Orthopnea, PND, swelling in lower extremities, anasarca, dizziness, palpitations, syncope.   GI  No heartburn, indigestion, abdominal pain, nausea, vomiting, diarrhea, change in bowel habits, loss of appetite, bloody stools.   Resp: No shortness of breath with exertion or at rest.  No excess mucus, no productive cough,  No non-productive cough,  No coughing up of blood.  No change in color of mucus.  No wheezing.  No chest wall deformity  Skin: no rash or lesions.  GU: no dysuria, change in color of urine, no urgency or frequency.  No flank pain, no hematuria   MS:  No joint pain or swelling.   No decreased range of motion.  No back pain.    Physical Exam  BP 136/66   Pulse 83   Ht 5' 2 (1.575 m)   Wt 114 lb 12.8 oz (52.1 kg)   SpO2 98% Comment: RA  BMI 21.00 kg/m   GEN: A/Ox3; pleasant , NAD, well nourished    HEENT:  [place]/AT, NOSE-clear, THROAT-clear, no lesions, no postnasal drip or exudate noted.   NECK:  Supple w/ fair ROM; no JVD; normal carotid impulses w/o bruits; no thyromegaly or  nodules palpated; no lymphadenopathy.    RESP  Clear  P & A; w/o, wheezes/ rales/ or rhonchi. no accessory muscle use, no dullness to percussion  CARD:  RRR, no m/r/g, no peripheral edema, pulses intact, no cyanosis or clubbing.  GI:   Soft & nt; nml bowel sounds; no organomegaly or masses detected.   Musco: Warm bil, no deformities or joint swelling noted.   Neuro: alert, no focal deficits noted.    Skin: Warm, no lesions or rashes    Lab Results:  CBC  BNP No results found for: BNP  ProBNP No results found for: PROBNP  Imaging: No results found.  Administration History     None           No data to display          No results found for: NITRICOXIDE      Assessment & Plan:   Assessment and Plan Assessment & Plan Asthma   Asthma is well-controlled without Advair , with no significant symptoms. Discussed asthma action plan. Restart maintenance inhaler is flare of asthma symptoms. . Use albuterol  as needed. . Check PFT on return.   Allergic rhinitis   Chronic allergies are managed with Singulair , which also supports asthma control. Use Flonase  and Claritin  as needed for symptom control, especially during this time of year. Continue Singulair  daily.   Plan  Patient Instructions  May remain off Advair .  Continue on Singulair  daily  Claritin  10 daily As needed   Flonase  2 puffs daily As needed   Activity as tolerated.  Albuterol  Inhaler As needed  Wheezing .  Follow up with Dr. Jude  or Caius Silbernagel NP in 1 year and As needed  with PFT           Denise Stank, NP 04/22/2024

## 2024-10-21 ENCOUNTER — Encounter
